# Patient Record
Sex: Female | Born: 1960
Health system: Southern US, Community
[De-identification: ages and names within clinical notes are randomized; demographics above are authoritative.]

## PROBLEM LIST (undated history)

## (undated) DIAGNOSIS — D649 Anemia, unspecified: Secondary | ICD-10-CM

## (undated) DIAGNOSIS — M545 Low back pain, unspecified: Secondary | ICD-10-CM

## (undated) DIAGNOSIS — M797 Fibromyalgia: Secondary | ICD-10-CM

## (undated) DIAGNOSIS — M1990 Unspecified osteoarthritis, unspecified site: Secondary | ICD-10-CM

## (undated) DIAGNOSIS — K852 Alcohol induced acute pancreatitis without necrosis or infection: Secondary | ICD-10-CM

## (undated) DIAGNOSIS — F329 Major depressive disorder, single episode, unspecified: Secondary | ICD-10-CM

## (undated) DIAGNOSIS — F32A Depression, unspecified: Secondary | ICD-10-CM

## (undated) DIAGNOSIS — G8929 Other chronic pain: Secondary | ICD-10-CM

## (undated) DIAGNOSIS — F419 Anxiety disorder, unspecified: Secondary | ICD-10-CM

## (undated) DIAGNOSIS — G43909 Migraine, unspecified, not intractable, without status migrainosus: Secondary | ICD-10-CM

## (undated) DIAGNOSIS — M1711 Unilateral primary osteoarthritis, right knee: Secondary | ICD-10-CM

## (undated) DIAGNOSIS — Z9289 Personal history of other medical treatment: Secondary | ICD-10-CM

## (undated) DIAGNOSIS — S83271A Complex tear of lateral meniscus, current injury, right knee, initial encounter: Secondary | ICD-10-CM

## (undated) HISTORY — PX: ENDOMETRIAL ABLATION: SHX621

## (undated) HISTORY — PX: POSTERIOR FUSION CERVICAL SPINE: SUR628

## (undated) HISTORY — PX: KNEE SURGERY: SHX244

## (undated) HISTORY — DX: Unspecified osteoarthritis, unspecified site: M19.90

## (undated) HISTORY — PX: CARPAL TUNNEL RELEASE: SHX101

## (undated) HISTORY — PX: OSTEOTOMY AND ULNAR SHORTENING: SHX2140

## (undated) HISTORY — DX: Alcohol induced acute pancreatitis without necrosis or infection: K85.20

## (undated) HISTORY — PX: BACK SURGERY: SHX140

## (undated) HISTORY — PX: CYSTECTOMY: SUR359

## (undated) HISTORY — PX: TONSILLECTOMY: SUR1361

## (undated) HISTORY — PX: JOINT REPLACEMENT: SHX530

## (undated) HISTORY — PX: LAPAROSCOPIC CHOLECYSTECTOMY: SUR755

## (undated) HISTORY — PX: ANTERIOR CERVICAL DECOMP/DISCECTOMY FUSION: SHX1161

## (undated) HISTORY — DX: Migraine, unspecified, not intractable, without status migrainosus: G43.909

---

## 2000-09-28 ENCOUNTER — Other Ambulatory Visit: Admission: RE | Admit: 2000-09-28 | Discharge: 2000-09-28 | Payer: Self-pay | Admitting: *Deleted

## 2002-01-01 ENCOUNTER — Other Ambulatory Visit: Admission: RE | Admit: 2002-01-01 | Discharge: 2002-01-01 | Payer: Self-pay | Admitting: Obstetrics and Gynecology

## 2002-03-13 ENCOUNTER — Ambulatory Visit (HOSPITAL_BASED_OUTPATIENT_CLINIC_OR_DEPARTMENT_OTHER): Admission: RE | Admit: 2002-03-13 | Discharge: 2002-03-14 | Payer: Self-pay | Admitting: Orthopedic Surgery

## 2004-02-14 ENCOUNTER — Emergency Department (HOSPITAL_COMMUNITY): Admission: EM | Admit: 2004-02-14 | Discharge: 2004-02-14 | Payer: Self-pay | Admitting: *Deleted

## 2004-02-17 ENCOUNTER — Other Ambulatory Visit: Admission: RE | Admit: 2004-02-17 | Discharge: 2004-02-17 | Payer: Self-pay | Admitting: Obstetrics and Gynecology

## 2004-09-15 ENCOUNTER — Other Ambulatory Visit: Admission: RE | Admit: 2004-09-15 | Discharge: 2004-09-15 | Payer: Self-pay | Admitting: Obstetrics & Gynecology

## 2005-02-15 ENCOUNTER — Emergency Department (HOSPITAL_COMMUNITY): Admission: EM | Admit: 2005-02-15 | Discharge: 2005-02-15 | Payer: Self-pay | Admitting: Emergency Medicine

## 2005-02-25 ENCOUNTER — Other Ambulatory Visit: Admission: RE | Admit: 2005-02-25 | Discharge: 2005-02-25 | Payer: Self-pay | Admitting: Obstetrics & Gynecology

## 2005-09-02 ENCOUNTER — Ambulatory Visit (HOSPITAL_COMMUNITY): Admission: RE | Admit: 2005-09-02 | Discharge: 2005-09-03 | Payer: Self-pay | Admitting: Neurosurgery

## 2005-09-22 ENCOUNTER — Encounter: Admission: RE | Admit: 2005-09-22 | Discharge: 2005-09-22 | Payer: Self-pay | Admitting: Neurosurgery

## 2005-11-22 HISTORY — PX: KNEE SURGERY: SHX244

## 2006-03-22 ENCOUNTER — Encounter: Admission: RE | Admit: 2006-03-22 | Discharge: 2006-03-22 | Payer: Self-pay | Admitting: Neurosurgery

## 2006-10-31 ENCOUNTER — Inpatient Hospital Stay (HOSPITAL_COMMUNITY): Admission: RE | Admit: 2006-10-31 | Discharge: 2006-11-02 | Payer: Self-pay | Admitting: Orthopedic Surgery

## 2006-11-22 HISTORY — PX: POSTERIOR FUSION CERVICAL SPINE: SUR628

## 2006-12-26 ENCOUNTER — Ambulatory Visit (HOSPITAL_BASED_OUTPATIENT_CLINIC_OR_DEPARTMENT_OTHER): Admission: RE | Admit: 2006-12-26 | Discharge: 2006-12-26 | Payer: Self-pay | Admitting: Orthopedic Surgery

## 2007-05-25 ENCOUNTER — Ambulatory Visit (HOSPITAL_COMMUNITY): Admission: RE | Admit: 2007-05-25 | Discharge: 2007-05-25 | Payer: Self-pay | Admitting: Neurosurgery

## 2007-06-12 ENCOUNTER — Inpatient Hospital Stay (HOSPITAL_COMMUNITY): Admission: RE | Admit: 2007-06-12 | Discharge: 2007-06-13 | Payer: Self-pay | Admitting: Neurosurgery

## 2008-11-22 HISTORY — PX: CARPAL TUNNEL RELEASE: SHX101

## 2009-02-21 ENCOUNTER — Ambulatory Visit (HOSPITAL_COMMUNITY): Admission: RE | Admit: 2009-02-21 | Discharge: 2009-02-21 | Payer: Self-pay | Admitting: Obstetrics and Gynecology

## 2009-04-25 ENCOUNTER — Encounter: Admission: RE | Admit: 2009-04-25 | Discharge: 2009-04-25 | Payer: Self-pay | Admitting: Orthopedic Surgery

## 2009-05-21 ENCOUNTER — Ambulatory Visit (HOSPITAL_BASED_OUTPATIENT_CLINIC_OR_DEPARTMENT_OTHER): Admission: RE | Admit: 2009-05-21 | Discharge: 2009-05-21 | Payer: Self-pay | Admitting: Orthopedic Surgery

## 2009-07-23 ENCOUNTER — Encounter (INDEPENDENT_AMBULATORY_CARE_PROVIDER_SITE_OTHER): Payer: Self-pay | Admitting: Orthopedic Surgery

## 2009-07-23 ENCOUNTER — Ambulatory Visit (HOSPITAL_BASED_OUTPATIENT_CLINIC_OR_DEPARTMENT_OTHER): Admission: RE | Admit: 2009-07-23 | Discharge: 2009-07-23 | Payer: Self-pay | Admitting: Orthopedic Surgery

## 2009-11-22 HISTORY — PX: LAPAROSCOPIC CHOLECYSTECTOMY: SUR755

## 2011-02-26 LAB — ANAEROBIC CULTURE: Gram Stain: NONE SEEN

## 2011-02-26 LAB — FUNGUS CULTURE W SMEAR
Fungal Smear: NONE SEEN
Fungal Smear: NONE SEEN

## 2011-02-26 LAB — WOUND CULTURE
Culture: NO GROWTH
Culture: NO GROWTH
Gram Stain: NONE SEEN

## 2011-02-26 LAB — POCT HEMOGLOBIN-HEMACUE: Hemoglobin: 13.1 g/dL (ref 12.0–15.0)

## 2011-03-01 LAB — POCT HEMOGLOBIN-HEMACUE: Hemoglobin: 12.6 g/dL (ref 12.0–15.0)

## 2011-03-03 LAB — CBC
HCT: 32.4 % — ABNORMAL LOW (ref 36.0–46.0)
Hemoglobin: 11.4 g/dL — ABNORMAL LOW (ref 12.0–15.0)
MCHC: 35.1 g/dL (ref 30.0–36.0)
MCV: 96.8 fL (ref 78.0–100.0)
Platelets: 214 10*3/uL (ref 150–400)
RBC: 3.34 MIL/uL — ABNORMAL LOW (ref 3.87–5.11)
RDW: 12.1 % (ref 11.5–15.5)
WBC: 6 10*3/uL (ref 4.0–10.5)

## 2011-04-06 NOTE — Op Note (Signed)
[name], [name]               ACCOUNT NO.:  [id number]   MEDICAL RECORD NO.:  [id number]          PATIENT TYPE:  INP   LOCATION:  3033                         FACILITY:  [facility]   PHYSICIAN:  Reinaldo Meeker, M.D. DATE OF BIRTH:  [birth year]   DATE OF PROCEDURE:  06/12/2007  DATE OF DISCHARGE:                               OPERATIVE REPORT   PREOPERATIVE DIAGNOSIS:  Spondylosis and instability, C4-5 left.   POSTOPERATIVE DIAGNOSIS:  Spondylosis and instability, C4-5 left.   PROCEDURE:  C4-5 cervical foraminotomy, followed by C4-5 posterior  cervical fusion, followed by nonsegmental instrumentation with a Vertex  lateral mass plate, screws, E4-5.   SURGEON:  Reinaldo Meeker, M.D.   ASSISTANT:  Kathaleen Maser. Pool, M.D.   PROCEDURE IN DETAIL:  After being placed the prone position in three-  point pin fixation, the patient's neck was prepped and draped in the  usual sterile fashion.  Localizing fluoroscopy was used prior to  incision to identify the appropriate level.  Midline incision was made  over the spinous processes of C3, C4, C5 and C6.  Using Bovie cutting  current, the incision was carried down to the spinous processes.  Subperiosteal dissection was then carried out along the spinous  processes, laminae, facet joints and lateral masses bilaterally and a  self-retaining retractor was placed for exposure.  Fluoroscopy showed  approach to the C4-5 level.  Foraminotomy was then performed on the left  side by removing the inferior edge of the C4 lamina and the superior  edge of the C5 lamina, then undermining the facet joint without  destabilizing it.  The C5 nerve was then tracked out its foramen until  it was well decompressed.  Vascular adhesions upon it were coagulated  and incised longitudinally to completely expose and free up the nerve  root.  A full inspection was carried out at this level for any evidence  of residual compression and none could be identified.   Irrigation was  carried out.  Bleeding was controlled with bipolar coagulation and  Gelfoam.  Lateral mass plates were then placed without difficulty with  14 mm Vertex screws.  A template was used to measure the appropriate-  length rod, which was cut, secured to the screws with a top-loading nut.  Compression was then carried out on the patient's right side but not on  the left, and final tightening was carried out.  Posterior fusion in the  cervical region was then carried out by decorticating with a high-speed  drill and placing a combination of Vitoss and BMP.  A Hemovac drain was  then brought out through a separate stab incision and left in the  epidural space.  The wound was then closed in multiple layers of Vicryl  in the muscle, fascia, subcutaneous and subcuticular tissues.  Staples  were placed on the skin.  Final fluoroscopy showed the instrumentation  to be in excellent position.  Staples were placed on the skin.  A  sterile dressing was then applied.  The patient was extubated, taken to  the recovery room in stable condition.  ______________________________  Reinaldo Meeker, M.D.     ROK/MEDQ  D:  06/12/2007  T:  06/13/2007  Job:  [id number]

## 2011-04-06 NOTE — Op Note (Signed)
[name], [name]               ACCOUNT NO.:  [id number]   MEDICAL RECORD NO.:  [id number]          PATIENT TYPE:  AMB   LOCATION:  SDC                           FACILITY:  [facility]   PHYSICIAN:  Malva Limes, M.D.    DATE OF BIRTH:  1961/08/08   DATE OF PROCEDURE:  DATE OF DISCHARGE:                               OPERATIVE REPORT   PREOPERATIVE DIAGNOSIS:  Menorrhagia.   POSTOPERATIVE DIAGNOSIS:  Menorrhagia.   PROCEDURE:  1. Dilation and curettage.  2. Endometrial ablation with NovaSure device.   SURGEON:  Malva Limes, M.D.   ANESTHESIA:  MAC with paracervical block.   DRAINS:  Red rubber catheter bladder.   COMPLICATIONS:  None.   SPECIMENS:  None.   ESTIMATED BLOOD LOSS:  Minimal.   PROCEDURE:  The patient was taken to the operating room.  She was placed  in dorsal supine position.  A MAC anesthetic was administered without  difficulty.  She was then placed in dorsal lithotomy position and  prepped and draped in the usual fashion for this procedure.  Her bladder  was drained with a red rubber catheter.  Exam under anesthesia revealed  a retroverted uterus.  The cervix was then injected with 18 cc of 1%  lidocaine.  A single-tooth tenaculum was applied to the anterior  cervical lip.  The cervix was fairly dilated to a 29-French.  The  uterine cavity length was measured at 5 cm with.  Sharp curettage was  performed with minimal tissue being obtained.  The NovaSure device was  placed into the uterine cavity and opened to a width of 3.3 cm.  A seal  test was performed and passed.  The device was then turned on for a  minute and 10 seconds and device was removed.  The patient tolerated the  procedure well.  She was awakened and taken to the recovery room in  stable condition.  Instrument and lap counts were correct x1.  The  patient will be discharged home.  She will be sent home with Vicodin to  take p.r.n.  She will follow up in the office in 4 weeks.     ______________________________  Malva Limes, M.D.     MA/MEDQ  D:  02/21/2009  T:  02/21/2009  Job:  [id number]

## 2011-04-06 NOTE — Op Note (Signed)
NAMEKANYLAH, MUENCH               ACCOUNT NO.:  [id number]   MEDICAL RECORD NO.:  [id number]          PATIENT TYPE:  AMB   LOCATION:  DSC                          FACILITY:  [facility]   PHYSICIAN:  Artist Pais. Weingold, M.D.DATE OF BIRTH:  03/02/1961   DATE OF PROCEDURE:  07/23/2009  DATE OF DISCHARGE:                               OPERATIVE REPORT   PREOPERATIVE DIAGNOSIS:  Failed hardware left ulnar shortening  osteotomy.   POSTOPERATIVE DIAGNOSIS:  Failed hardware left ulnar shortening  osteotomy.   PROCEDURE:  Redo fixation using a five-hole DCP plate and three 3.5 mm  cortical screws and one 16-mm cortical screw.   SURGEON:  Artist Pais. Mina Marble, MD   ASSISTANT:  Betha Loa, MD   ANESTHESIA:  Axillary block and general.   TOURNIQUET TIME:  43 minutes.   COMPLICATIONS:  None.   DRAINS:  None.   OPERATIVE REPORT:  The patient was taken to the operative suite.  After  induction of adequate axillary block analgesia and general anesthetic,  left upper extremity was prepped and draped in sterile fashion.  An  Esmarch was used to exsanguinate the limb and tourniquet was inflated to  250 mmHg.  At this point in time, using a previous incision, the skin  was incised to 6-8 cm.  The interval between the ECU and FCU was  developed down to the osteotomy site.  There was complete failure of the  distal locking screws on the 2.4 mm Synthes modular handset plate.  The  plate and screws were removed without difficulty.  The fracture site was  debrided.  Cultures were sent both of the fluid and soft tissue  surrounding the osteotomy site.  Once this was done, the osteotomy site  was freshened, all debris was removed and plate fixation was then  achieved using a five-hole DCP plate with the distal fragment being  fixated first followed by fixating to the proximal fragment using the  appropriate reduction clamps.  After this was done, intraoperative  fluoroscopy revealed adequate  reduction in AP and lateral view including  about 1 mm of ulnar minus variance.  The wound was then irrigated and  loosely closed in layers of 2-0 Vicryl and 3-0 Prolene subcuticular  stitch on the skin.  Steri-Strips, 4x4s fluffs and a sugar-tong splint  was applied.  The patient tolerated the procedure well and went to  recovery room in stable fashion.     Artist Pais Mina Marble, M.D.  Electronically Signed    MAW/MEDQ  D:  07/23/2009  T:  07/24/2009  Job:  [id number]

## 2011-04-09 NOTE — Op Note (Signed)
[name], [name]               ACCOUNT NO.:  [id number]   MEDICAL RECORD NO.:  [id number]          PATIENT TYPE:  AMB   LOCATION:  SDS                          FACILITY:  [facility]   PHYSICIAN:  Reinaldo Meeker, M.D. DATE OF BIRTH:  [birth year]   DATE OF PROCEDURE:  09/02/2005  DATE OF DISCHARGE:                                 OPERATIVE REPORT   PREOPERATIVE DIAGNOSIS:  Herniated disk, C5-6, C6-7 left.   POSTOPERATIVE DIAGNOSIS:  Herniated disk, C5-6, C6-7 left.   OPERATION PERFORMED:  C5-6, C6-7 anterior cervical diskectomy with bone bank  fusion followed by AccuFix anterior cervical plating.   SURGEON:  Reinaldo Meeker, M.D.   ASSISTANT:  Tia Alert, MD   ANESTHESIA:  General.   DESCRIPTION OF PROCEDURE:  After being placed in the supine position and  five pounds halter traction, the patient's neck was prepped and draped in  the usual sterile fashion.  Localizing x-ray was taken prior to incision to  identify the appropriate level.  A transverse incision was made in the right  anterior neck, starting at the midline heading towards the medial aspect of  the sternocleidomastoid muscle.  The platysma muscle was then incised  transversely.  The natural fascia plane between the strap muscles medially  and the sternocleidomastoid muscle laterally was identified, followed down  to the anterior aspect of the cervical spine.  The longus colli muscles were  identified and split in the midline, stripped away bilaterally with the  Optician, dispensing.  Self-retaining retractor was placed for  exposure and x-ray showed approach to the appropriate levels.  Using a 15  blade, the annulus of the disk at C5-6 and C6-7 was incised.  Using  pituitary rongeurs and curettes, approximately 90% of the disk material was  removed.  High speed drill was used to widen the interspace.  The microscope  was draped and brought into the field and used for the remainder of the  case.   Starting at C5-6 using microdissection technique the remainder of  disk material down to posterior longitudinal ligament was removed.  The  ligament was incised transversely and the cut edges were removed with  Kerrison punch.  Inspection was carried out the foramina bilaterally until  the C6 nerve roots bilaterally were decompressed.  At this time attention  was turned to the C6-7.  Once again the remainder of the disk material down  the posterior longitudinal ligament was removed.  Once again the ligament  was incised and cut edges removed.  Once again thorough spinal decompression  was carried out as well as decompression of the proximal nerve roots in  their foramina.  At this time inspection was carried out in all directions  for any evidence of residual compression and none could be identified.  Large amounts of irrigation were carried out at this time.  Any bleeding was  controlled with bipolar coagulation and Gelfoam.  Measurements were taken  and 6 mm bone bank plugs were reconstituted and impacted at C5-6 and a 7 mm  plug impacted at C6-7.  Fluoroscopy showed  them to be in good position.  An  appropriate length Accufix anterior cervical plate was then chosen.  Under  fluoroscopic guidance, drill holes were placed followed by placement of 13  mm screws times six.  These were done under fluoroscopic guidance.  When  they were all in, the plates, screws and plugs were all found to be in good  position.  At this time large amounts of irrigation were carried out.  Any  bleeding was controlled with  bipolar coagulation.  The wound was then closed with interrupted Vicryl on  the platysma muscle, inverted 5-0 PDS in the subcuticular layer and Steri-  Strips on the skin.  Sterile dressing and soft collar were then applied.  The patient was extubated and taken to recovery room in stable condition.           ______________________________  Reinaldo Meeker, M.D.     ROK/MEDQ  D:   09/02/2005  T:  09/03/2005  Job:  [id number]

## 2011-04-09 NOTE — Op Note (Signed)
[name], [name]               ACCOUNT NO.:  [id number]   MEDICAL RECORD NO.:  [id number]          PATIENT TYPE:  AMB   LOCATION:  DSC                          FACILITY:  [facility]   PHYSICIAN:  Robert A. Thurston Hole, M.D. DATE OF BIRTH:  1961-03-02   DATE OF PROCEDURE:  12/26/2006  DATE OF DISCHARGE:                               OPERATIVE REPORT   PREOPERATIVE DIAGNOSIS:  Left total knee arthrofibrosis.   POSTOPERATIVE DIAGNOSIS:  Left total knee arthrofibrosis.   PROCEDURE:  Left total knee examination under anesthesia followed by  manipulation and injection.   SURGEON:  Elana Alm. Thurston Hole, M.D.   ANESTHESIA:  General.   OPERATIVE TIME:  10 minutes.   COMPLICATIONS:  None.   INDICATIONS FOR PROCEDURE:  Ms. [name] is a 45-year woman who is two  months post left total knee replacement who has had significant  difficulty regaining flexion.  She has been stuck at 90 degrees of  flexion for the past month and is now to undergo manipulation and  injection.   DESCRIPTION:  Ms. [name] was brought to operating room on 12/26/2006,  placed operative table in supine position.  She received Ancef 1 gram IV  preoperatively for prophylaxis.  After being placed under general  anesthesia, her left knee was examined.  Range of motion from zero to 90  degrees.  A gentle manipulation and flexion was carried out breaking up  adhesions and improving flexion to 125 degrees.  Extension remained at  0, knee remained stable with normal patellar tracking.  The knee was  then sterilely injected with 10 mL of 0.25% Marcaine with epinephrine  and 80 mg of Depo-Medrol.  She tolerated this well.   FOLLOW-UP CARE:  She will be treated as outpatient on Percocet for pain  with early aggressive physical therapy and a CPM machine.  See her back  in the office in a week for recheck and follow-up.      Robert A. Thurston Hole, M.D.  Electronically Signed     RAW/MEDQ  D:  12/26/2006  T:  12/27/2006  Job:   [id number]

## 2011-04-09 NOTE — Discharge Summary (Signed)
NAMEANJU, SERENO               ACCOUNT NO.:  [id number]   MEDICAL RECORD NO.:  [id number]          PATIENT TYPE:  INP   LOCATION:  5007                         FACILITY:  [facility]   PHYSICIAN:  Elana Alm. Thurston Hole, M.D. DATE OF BIRTH:  05-30-1961   DATE OF ADMISSION:  10/31/2006  DATE OF DISCHARGE:  11/02/2006                               DISCHARGE SUMMARY   ADMITTING DIAGNOSIS:  End-stage degenerative joint disease left knee.   DISCHARGE DIAGNOSIS:  1. End-stage degenerative joint disease left knee.  2. Acute blood loss anemia.  3. Hypokalemia   HISTORY OF PRESENT ILLNESS:  The patient is a 50 year old white female  with a history of significant trauma to her knee and she has undergone  five left knee surgeries including two large open procedures including  an ACL.  She now has post traumatic DJD.  She has failed conservative  care including debriding arthroscopy and anti-inflammatories and formal  physical therapy.  She understands her risks, benefits and possible  complications of the left total knee replacement and is without  questions.   PROCEDURES IN HOUSE:  On October 31, 2006 the patient underwent a left  total knee replacement by Dr. Thurston Hole and a left femoral nerve block by  anesthesia.  She tolerated both procedures well.   HOSPITAL COURSE:  She was admitted postoperatively for pain control, DVT  prophylaxis and physical therapy.  She was placed on the CPM 0 to 90  degrees postoperatively.  Postop day #1 hemoglobin was 7.9.  She was  typed and crossed and transfused 2 units of packed red blood cells with  20 mg Lasix IV between units.  She was given milk of magnesia to prevent  constipation.  Her PCA was discontinued.  Her Foley was discontinued and  her dressing was changed.  Postop day #2 pain was under control with  p.o. pain medicine, two Oxy-IR every 3 hours.  Her hemoglobin post  transfusion was 9.7.  She was hypokalemic with a potassium of 3.2 after  IV  Lasix.  She was not tolerating the CPM as well since her femoral  nerve block had worn off and she was only 0 to 40 on her CPM.  She was  given 40 mEq of K-Dur twice before discharge.  She was given home CPM  unit, home walker with wheels, home three in one commode.  She was  discharged on Oxy-IR 5 mg one to two q. 3 hours p.r.n. pain, Robaxin 500  mg one p.o. q. 4 to 6 hours p.r.n. muscle spasm and Coumadin 5 mg daily  and slow release iron 1 tablet twice a day.  She has been instructed to  take Colace 100 mg twice a day and if no BM in 2 days then Senokot S two  tablets with  dinner.  She has been instructed to use her CPM 0 to 60 degrees 8 hours  a day, increasing by 5 degrees a day, elevate her left heel on a folded  pillow for 30 minutes every morning.  She will follow up with Dr. Thurston Hole  on November 10, 2006 for stitch removal and x-ray.      Kirstin Shepperson, P.A.      Robert A. Thurston Hole, M.D.  Electronically Signed    KS/MEDQ  D:  12/22/2006  T:  12/22/2006  Job:  [id number]

## 2011-04-09 NOTE — Op Note (Signed)
[facility]  Patient:    [name], [name] Visit Number: [id number] MRN: [id number]          Service Type: DSU Location: [facility] Attending Physician:  Twana First Dictated by:   Elana Alm Thurston Hole, M.D. Proc. Date: 03/13/02 Admit Date:  03/13/2002                             Operative Report  PREOPERATIVE DIAGNOSES: 1. Right knee anterior cruciate ligament tear. 2. Right knee lateral meniscus tear.  POSTOPERATIVE DIAGNOSES: 1. Right knee anterior cruciate ligament tear. 2. Right knee lateral meniscus tear.  PROCEDURE: 1. Right knee EOA, followed by arthroscopically assisted endoscopic bone    patellar tendon bone allograft ACL reconstruction using 9 x 24 mm femoral    interference BioScrew and 9 x 25 mm tibial interference BioScrew. 2. Right knee partial meniscectomy.  SURGEON:  Elana Alm. Thurston Hole, M.D.  ASSISTANT:  Julien Girt, P.A.  ANESTHESIA:  General.  OPERATIVE TIME:  1 hour and 20 minutes.  COMPLICATIONS:  None.  INDICATIONS FOR PROCEDURE:  The patient is a 50 year old woman who sustained injury to her right knee 1 month ago with a twisting type injury.  Significant pain, swelling, and instability with examination with an MRI documenting a complete ACL tear and probable meniscal tear who has failed conservative care and is now to undergo arthroscopy and ACL reconstruction.  DESCRIPTION OF PROCEDURE:  The patient is brought to the operating room on March 13, 2002, and placed on the operating room table in supine position. After an adequate level of general anesthesia was obtained her right knee was examined under anesthesia.  She had full range of motion, 3+ Lachman, positive pivot shift, and knee stable to varus, valgus and posterior stress with normal patella tracking.  The right knee was sterilely injected with 0.25% Marcaine with epinephrine. She received Ancef 1 gram IV preoperatively for prophylaxis.  The right  leg was then prepped using sterile Betadine and draped using sterile technique. Originally through an inferolateral portal the arthroscope with a pump attached was placed and through an inferomedial portal and arthroscopic probe was placed.  On initial inspection of the medial compartment, the articular cartilage, medial femoral condyle, medial tibial plateau, was found to be normal.  The medial meniscus was probed and this was found to be normal.  The intercondylar notch was inspected and the anterior cruciate ligament was completely torn in its mid substance with significant anterior laxity and this was thoroughly debrided and an notch plasty was performed.  The posterior cruciate was intact and stable.  The lateral compartment, articular cartilage, showed mild grade 1 to 2 chondromalacia.  The lateral meniscus showed tearing at the posterolateral corner.  A radial flap tear of which 20% was resected back to a stable rim.  Popliteus tendon was intact.  Patellofemoral joint showed grade 3 chondromalacia with 30% of the patella which was debrided.  The femoral groove, articular cartilage was intact and the patella tracked normally.  Moderate synovitis in the medial and lateral gutters was debrided otherwise this was free of pathology.  At this point then a separate anterior medial portal was made for placement of the tibial drill guide.  A Steinmann pin was then used to drill the ACL insertion point on the tibial plateau through a 1-1/2 cm anterior medial proximal tibial incision and overdrilled with a 10 mm drill.  Through this tibial tunnel the posterior  femoral guide was placed into the posterior femoral notch and a Steinmann pin drilled up in the ACL origin point of the posterior femoral notch and then overdrilled to a depth of 30 mm leaving a posterior 2 mm bone bridge.  After this was done a double pin passer was brought up through the tibial tunnel and joint and through the  femoral tunnel and thigh through a stab wound.  This was used to pass the ACL graft which had been prepared on the back table with a 10 x 25 mm of patella bone and tibial tubercle bone and a 12 mm tubed allograft.  After the ACL graft was passed up through the tibial hole and joined into the femoral tunnel it was locked into position there with a 9 x 25 mm femoral interference BioScrew.  The knee was then taken through a full range of motion and there was found to be no impingement on the graft.  The tibial bone plug was then locked into its tunnel with a 9 x 25 mm interference BioScrew screw with the knee in 30 degrees of flexion and the tibial held reduced on the femur.  After this was done the knee was tested for stability. The Lachman and pivot shift were found to be totally eliminated and the knee could be brought through a full range of motion with no impingement of the graft.  At this point it was felt that all pathology had been satisfactorily addressed.  The wounds were irrigated and closed using 2-0 Vicryl and 3-0 Prolene.  Steri-Strips were applied.  Sterile dressings and a long leg splint applied.  The patient then had a femoral nerve block placed by anesthesia for postoperative pain control.  She was then awakened and taken to the recovery room in stable condition.  FOLLOWUP:  The patient will be followed as a overnight recovery care patient for IV pain control and neurovascularly monitoring, CPM and ice machine use. Discharged tomorrow on Percocet and Naprosyn with an home CPM and ice machine. See me back in the office in 1 week for sutures out and follow up. Dictated by:   Elana Alm Thurston Hole, M.D. Attending Physician:  Twana First DD:  03/13/02 TD:  03/13/02 Job: [id number]

## 2011-04-09 NOTE — Op Note (Signed)
[name], [name]               ACCOUNT NO.:  [id number]   MEDICAL RECORD NO.:  [id number]          PATIENT TYPE:  AMB   LOCATION:  DSC                          FACILITY:  [facility]   PHYSICIAN:  Artist Pais. Weingold, M.D.DATE OF BIRTH:  1961-06-05   DATE OF PROCEDURE:  05/22/2009  DATE OF DISCHARGE:                               OPERATIVE REPORT   PREOPERATIVE DIAGNOSIS:  Left wrist ulnar carpal abutment syndrome.   POSTOPERATIVE DIAGNOSIS:  Left wrist ulnar carpal abutment syndrome.   PROCEDURE:  Left wrist arthroscopy, debridement of triangular  fibrocartilage complex and lunotriquetral tears as well as open ulnar  shortening osteotomy with plate fixation.   SURGEON:  Artist Pais. Mina Marble, MD   ASSISTANT:  None.   ANESTHESIA:  Axillary block and general.   TOURNIQUET TIME:  One hour and 20 minutes.   COMPLICATIONS:  None.   DRAIN:  None.   OPERATIVE REPORT:  The patient was taken to the operating suite.  After  induction of adequate general anesthesia, left upper extremity was  prepped and draped in sterile fashion.  Esmarch was used to exsanguinate  the limb.  Tourniquet was then inflated with 250 mm.  At this point in  time, an incision was made over the subcutaneous border of the ulna in  the left distal forearm.  Incision was started 3 cm proximal to the  ulnar styloid tip, extended proximally in longitudinal fashion.  Skin  was incised.  The interval between the FCU and ECU was split.  A  subperiosteal dissection was undertaken of the ulna.  The ulna was  exposed.  On the dorsal surface, a 2.4-mm modular handset plate was then  placed, two locking screws were placed distally.  Once this was done,  the bone was marked with a marking pen to help maintain proper  alignment.  Plate was removed, an oblique osteotomy was performed using  a sagittal saw.  Once this was done, the shortening was undertaken of  about 3 mm.  The bone was shortened and then fixed with the  remaining  cortical screws and a commissure locked and unlocked screws proximally  and distally.  Small bone graft was packed around the osteotomy site.  Intraoperative fluoroscopy revealed ulnar neutral to slightly minus  variance and good alignment in AP and lateral plain, and wound was  irrigated and loosely closed in layers of 2-0 Vicryl and a 3-0 Prolene  subcuticular stitch on the skin.  The patient's upper extremity was then  placed in traction tower with 15 pounds traction across the radiocarpal  joint.  A standard 3/4 arthroscopic portal was established.  Once this  was done.  Scope was introduced into the joint.  A 4/5 working portal  was established as well as a 6/2 outflow portal.  A complex TFCC tear  was debrided using 6.9 suction shaver and a 2.3 mm ArthroCare wand.  Once this was done, a small LT tear was debrided as well.  Visualization  of the radial side of the joint revealed intact radial side ligaments  and slight fraying of the SL ligament was  also debrided  using the ArthroCare wand.  At the end of procedure, the instrument was  remove, the portals were closed with 3-0 Prolene.  Steri-Strips, 4 x 4s  fluffs, and a volar splint was applied.  The patient tolerated the  procedures well.  Wound was scrubbed in stable fashion.      Artist Pais Mina Marble, M.D.  Electronically Signed     MAW/MEDQ  D:  05/22/2009  T:  05/22/2009  Job:  [id number]

## 2011-04-09 NOTE — Op Note (Signed)
NAMEJANDY, [name]               ACCOUNT NO.:  [id number]   MEDICAL RECORD NO.:  [id number]          PATIENT TYPE:  INP   LOCATION:  5007                         FACILITY:  [facility]   PHYSICIAN:  Elana Alm. Thurston Hole, M.D. DATE OF BIRTH:  May 25, 1961   DATE OF PROCEDURE:  10/31/2006  DATE OF DISCHARGE:                               OPERATIVE REPORT   PREOPERATIVE DIAGNOSIS:  Left knee posttraumatic degenerative joint  disease.   POSTOPERATIVE DIAGNOSIS:  Left knee posttraumatic degenerative joint  disease.   PROCEDURE:  Left total knee replacement using DePuy cemented total knee  system with #4 cement femur, #3 cement tibia with 10-mm polyethylene RP  tibial spacer, and 35 mm polyethylene cemented patella.   SURGEON:  Salvatore Marvel, MD   ASSISTANT:  Julien Girt, PA-C   ANESTHESIA:  General.   OPERATIVE TIME:  One hour and 40 minutes.   COMPLICATIONS:  None.   DESCRIPTION OF PROCEDURE:  [name] was brought to the operating room  on October 31, 2006 and placed on the operative table in supine  position after a femoral nerve block was placed in the holding area by  anesthesia.  She was placed on the operative table in supine position.  She received Ancef 1 gm IV preoperatively for prophylaxis.  After being  placed under general anesthesia, her left knee was examined.  She had  range of motion from -3 to 125 degrees, knee with stable ligamentous  exam with normal patella tracking.  She had a Foley catheter placed  under sterile conditions.  I evaluated her knee and her old incisions  preoperatively and also had Dr. Turner Daniels and Dr. Eulah Pont evaluate the  previous medial and lateral incisions, and we all agreed that a central  incision, staying halfway between the old medial and old lateral  incisions, would be the best incision to make and not compromise  exposure and not compromise blood flow to these previous flaps.  I  discussed this in detail with [name] prior to  surgery as well and  she understood.  After the leg was prepped and draped using sterile  technique, the leg was exsanguinated and a tourniquet elevated to 365  mmHg.  Initially, through a 20 cm longitudinal incision in the center of  her knee, initial exposure was made.  The subcutaneous tissues were  incised along with the skin incision.  A median arthrotomy was performed  being very careful not to develop or undercut her flaps on the medial or  the lateral side.  The arthrotomy was performed revealing an excessive  amount of normal-appearing joint fluid.  The articular surfaces were  inspected.  She had grade 4 changes medially and grade 3 and 4 changes  laterally and in the patellofemoral joint.  Osteophytes were removed off  the femoral condyles and tibial plateau.  The medial and lateral  meniscal remnants were removed as well as the anterior cruciate  ligament.  Intramedullary drill was then drilled up the femoral canal  for placement of the distal femoral cutting jig, which was placed in the  appropriate amount of  rotation, and a distal 11 mm cut was made.  The  distal femur was then sized.  A #4 was found to be the appropriate size.  The #4 cutting jig was placed and then these cuts were made.  The  proximal tibia was then exposed.  Tibial spines were removed with an  oscillating saw.  The intramedullary drill was drilled down to the  tibial canal for placement of the proximal tibial cutting jig, which was  placed in the appropriate amount of rotation, and a 6 mm cut was made  based off the medial or lower side.  After this was done, spacer blocks  were placed, both 10-mm blocks placed in flexion and extension.  This  gave excellent balancing and excellent correction of her flexion and  varus deformity.  At this point, the proximal tibia was then exposed.  The #3 tibial base plate was placed and a Keo cut was made.  The PCL box  cutter was then placed on the distal femur and these  cuts were made.  At  this point, the #4 femoral trial was placed, with the #3 tibial base  plate trial, and a 10 mm polyethylene RP tibial spacer.  The knee was  reduced, taken through a range of motion from 0-125 degrees with  excellent stability and excellent correction of her flexion and varus  deformities.  The patella was then sized,  to resurfacing 10 mm cut made  and three locking holes placed for a 35-mm patella.  The patella trial  was placed.  Patellofemoral tracking was evaluated and this was found to  be normal.  At this point, it was felt that all the trial components  were of excellent size, fit, and stability.  They were then removed, and  the knee was then gently lavaged and irrigated with 3 liters of saline.  The proximal tibia was then again exposed and a #3 tibial base plate  with cement backing was hammered into its position with an excellent fit  with excess cement being removed from around the edges.  The #4 femoral  component with cement backing was hammered into position, also with an  excellent fit, with excess cement being removed from around the edges.  The 10 mm polyethylene RP tibial spacer was placed along the tibial base  plate.  The knee reduced and taken through a range of motion from 0-125  degrees with excellent stability and excellent correction of her flexion  and varus deformities.  The 35-mm polyethylene cement back patella was  then placed in its position and held there with a clamp.  After the  cement hardened, patellofemoral tracking was again evaluated and again  found to be normal.  At this point, it was felt that all the components  were of excellent size, fit, and stability.  The knee was further  irrigated with saline and then the tourniquet was released.  Hemostasis  was obtained with cautery.  The arthrotomy was then closed with #1 Ethibond suture over two medium Hemovac drains.  Subcutaneous tissue was  closed with 0 and 2-0 Vicryl.  The  subcuticular layer was closed with 4-  0 Monocryl.  Steri-Strips and sterile dressings were applied.  The  Hemovac injected with 0.25% Marcaine with epinephrine and 4 mg of  morphine and clamped.  The patient was then awakened, extubated, and  taken to the recovery room in stable condition.  Needles and sponge  counts were correct x2 at the end of  the case.      Robert A. Thurston Hole, M.D.  Electronically Signed     RAW/MEDQ  D:  10/31/2006  T:  11/01/2006  Job:  [id number]

## 2011-05-11 ENCOUNTER — Other Ambulatory Visit: Payer: Self-pay | Admitting: Obstetrics & Gynecology

## 2011-08-23 DIAGNOSIS — K852 Alcohol induced acute pancreatitis without necrosis or infection: Secondary | ICD-10-CM

## 2011-08-23 HISTORY — DX: Alcohol induced acute pancreatitis without necrosis or infection: K85.20

## 2011-09-06 LAB — CBC
HCT: 35.1 — ABNORMAL LOW
Hemoglobin: 12.1
MCHC: 34.6
MCV: 95.1
Platelets: 176
RBC: 3.69 — ABNORMAL LOW
RDW: 12.9
WBC: 7.6

## 2012-05-04 ENCOUNTER — Ambulatory Visit: Payer: Self-pay | Admitting: Physical Therapy

## 2012-05-11 ENCOUNTER — Ambulatory Visit: Payer: Medicare Other | Attending: Pain Medicine | Admitting: Physical Therapy

## 2012-11-22 HISTORY — PX: BACK SURGERY: SHX140

## 2014-01-21 ENCOUNTER — Other Ambulatory Visit (HOSPITAL_COMMUNITY): Payer: Medicare Other

## 2014-01-29 ENCOUNTER — Inpatient Hospital Stay: Admit: 2014-01-29 | Payer: Self-pay | Admitting: Orthopedic Surgery

## 2014-01-29 SURGERY — ARTHROPLASTY, KNEE, TOTAL
Anesthesia: General | Laterality: Right

## 2014-03-15 ENCOUNTER — Other Ambulatory Visit: Payer: Self-pay | Admitting: Physician Assistant

## 2014-03-15 DIAGNOSIS — Z1231 Encounter for screening mammogram for malignant neoplasm of breast: Secondary | ICD-10-CM

## 2014-04-12 ENCOUNTER — Ambulatory Visit (HOSPITAL_BASED_OUTPATIENT_CLINIC_OR_DEPARTMENT_OTHER)
Admission: RE | Admit: 2014-04-12 | Discharge: 2014-04-12 | Disposition: A | Discharge status: Home / Self Care | Payer: Medicare HMO | Source: Ambulatory Visit | Attending: Family Medicine | Admitting: Family Medicine

## 2014-04-12 ENCOUNTER — Ambulatory Visit: Payer: Medicare Other

## 2014-04-12 ENCOUNTER — Other Ambulatory Visit (HOSPITAL_BASED_OUTPATIENT_CLINIC_OR_DEPARTMENT_OTHER): Payer: Self-pay | Admitting: Family Medicine

## 2014-04-12 DIAGNOSIS — Q674 Other congenital deformities of skull, face and jaw: Secondary | ICD-10-CM | POA: Insufficient documentation

## 2014-04-12 DIAGNOSIS — R51 Headache: Secondary | ICD-10-CM

## 2014-04-22 ENCOUNTER — Ambulatory Visit (HOSPITAL_BASED_OUTPATIENT_CLINIC_OR_DEPARTMENT_OTHER): Payer: Medicare HMO

## 2014-04-22 ENCOUNTER — Ambulatory Visit: Payer: Medicare Other

## 2014-05-01 ENCOUNTER — Ambulatory Visit (HOSPITAL_BASED_OUTPATIENT_CLINIC_OR_DEPARTMENT_OTHER)
Admission: RE | Admit: 2014-05-01 | Discharge: 2014-05-01 | Disposition: A | Discharge status: Home / Self Care | Payer: Medicare HMO | Source: Ambulatory Visit | Attending: Physician Assistant | Admitting: Physician Assistant

## 2014-05-01 DIAGNOSIS — Z1231 Encounter for screening mammogram for malignant neoplasm of breast: Secondary | ICD-10-CM | POA: Insufficient documentation

## 2014-06-05 ENCOUNTER — Encounter: Payer: Self-pay | Admitting: Neurology

## 2014-06-07 ENCOUNTER — Encounter: Payer: Self-pay | Admitting: Neurology

## 2014-06-07 ENCOUNTER — Ambulatory Visit (INDEPENDENT_AMBULATORY_CARE_PROVIDER_SITE_OTHER): Payer: Commercial Managed Care - HMO | Admitting: Neurology

## 2014-06-07 ENCOUNTER — Encounter (INDEPENDENT_AMBULATORY_CARE_PROVIDER_SITE_OTHER): Payer: Self-pay

## 2014-06-07 VITALS — BP 95/63 | HR 56 | Ht 66.5 in | Wt 151.0 lb

## 2014-06-07 DIAGNOSIS — R519 Headache, unspecified: Secondary | ICD-10-CM | POA: Insufficient documentation

## 2014-06-07 DIAGNOSIS — R51 Headache: Secondary | ICD-10-CM

## 2014-06-07 MED ORDER — AMITRIPTYLINE HCL 10 MG PO TABS: 15.0000 mg | ORAL_TABLET | Freq: Every day | ORAL | Status: DC

## 2014-06-07 NOTE — Progress Notes (Signed)
[facility]    Provider:  Dr Janann Colonel Referring Provider: Abigail Miyamoto, MD Primary Care Physician:  Abigail Miyamoto, MD  CC:  headache  53y/o woman referred from Dr Maceo Pro for headache evaluation  Around 3 months ago developed a near daily headache. Described as bifrontal, squeezing pressure type pain. Constant throughout the day, fluctuates throughout the day. At worst is a 6/10. No nausea or emesis. No photo or phonophobia. No focal motor or sensory changes. Occasional blurry vision with headache but otherwise normal. Recent eye exam was normal. No AMS or confusion, no dysarthria. Has difficulty with sleep, trouble staying asleep. No known snoring or apnea events. No known triggers. If the headache gets severe will take Imitrex and go to bed. Recently started on Elavil 10mg  nightly for headache around 3 weeks ago, no benefit noted. Has not tried any other preventive agent in the past. Sees a pain doctor, takes dilaudid 2mg , typically takes 2-3 times a day, has been on this for around 8 months. Has been on fentanyl patch (33mcg) has been on for a few years.   Around the same time noted multiple areas of indentation in her scalp. Notes she is getting more of these indentations. States that they are sore and ache. Are sensitive to touch.   Has chronic pain related to degenerative cervical disc disease. Has had surgery on multiple levels.   Review of Systems: Out of a complete 14 system review, the patient complains of only the following symptoms, and all other reviewed systems are negative. +  Blurred vision, anemia, easy bruising, easy bleeding, memory loss, confusion, headache  History   Social History  . Marital Status: Single    Spouse Name: Srihitha Tagliaferri     Number of Children: 2  . Years of Education: 12+   Occupational History  . Disabled    Social History Main Topics  . Smoking status: Current Every Day Smoker -- 1.00 packs/day    Types: Cigarettes  .  Smokeless tobacco: Never Used  . Alcohol Use: No  . Drug Use: No  . Sexual Activity: Not on file   Other Topics Concern  . Not on file   Social History Narrative   Patient is single.    Patient has 2 children.    Patient is disabled.    Patient has a college education.     History reviewed. No pertinent family history.  Past Medical History  Diagnosis Date  . Migraine   . Osteoarthritis   . Alcoholic pancreatitis 24/2353    01/15/13    Past Surgical History  Procedure Laterality Date  . Knee surgery      Current Outpatient Prescriptions  Medication Sig Dispense Refill  . amitriptyline (ELAVIL) 10 MG tablet       . baclofen (LIORESAL) 10 MG tablet Take 10 mg by mouth 2 (two) times daily.      Marland Kitchen buPROPion (WELLBUTRIN XL) 150 MG 24 hr tablet Take 150 mg by mouth daily.      . Calcium Carbonate-Vit D-Min (CALCIUM/VITAMIN D/MINERALS) 600-200 MG-UNIT TABS Take by mouth.      . Cyanocobalamin (VITAMIN B 12 PO) Take 500 mcg by mouth.      . fentaNYL (DURAGESIC - DOSED MCG/HR) 50 MCG/HR Place 50 mcg onto the skin every other day.       . gabapentin (NEURONTIN) 300 MG capsule Take 300 mg by mouth 3 (three) times daily. 1 TAB IN THE MORNING, 1 TAB AT LUNCH, 6  TABS AT BEDTIME      . HYDROmorphone (DILAUDID) 2 MG tablet Take 2 mg by mouth every 6 (six) hours as needed for severe pain.      . meclizine (ANTIVERT) 25 MG tablet Take 25 mg by mouth 3 (three) times daily as needed for dizziness.      . meloxicam (MOBIC) 15 MG tablet       . Multiple Vitamins-Minerals (MULTIVITAMIN PO) Take by mouth.      . Omega-3 Fatty Acids (FISH OIL) 1000 MG CAPS Take 1,000 mg by mouth.      . SUMAtriptan (IMITREX) 100 MG tablet        No current facility-administered medications for this visit.    Allergies as of 06/07/2014 - Review Complete 06/07/2014  Allergen Reaction Noted  . Vimovo [naproxen-esomeprazole] Swelling 06/05/2014    Vitals: BP 95/63  Pulse 56  Ht 5' 6.5" (1.689 m)  Wt 151  lb (68.493 kg)  BMI 24.01 kg/m2 Last Weight:  Wt Readings from Last 1 Encounters:  06/07/14 151 lb (68.493 kg)   Last Height:   Ht Readings from Last 1 Encounters:  06/07/14 5' 6.5" (1.689 m)     Physical exam: Exam: Gen: NAD, conversant Eyes: anicteric sclerae, moist conjunctivae HENT: Atraumatic, oropharynx clear Neck: Trachea midline; supple,  Lungs: CTA, no wheezing, rales, rhonic                          CV: RRR, no MRG Abdomen: Soft, non-tender;  Extremities: No peripheral edema  Skin: Normal temperature, no rash,  Psych: Appropriate affect, pleasant  Neuro: MS: AA&Ox3, appropriately interactive, normal affect   Attention: WORLD backwards  Speech: fluent w/o paraphasic error  Memory: good recent and remote recall  CN: PERRL, EOMI no nystagmus, no ptosis, sensation intact to LT V1-V3 bilat, face symmetric, no weakness, hearing grossly intact, palate elevates symmetrically, shoulder shrug 5/5 bilat,  tongue protrudes midline, no fasiculations noted.  Motor: normal bulk and tone Strength: 5/5  In all extremities  Coord: rapid alternating and point-to-point (FNF, HTS) movements intact.  Reflexes: symmetrical, bilat downgoing toes  Sens: LT intact in all extremities  Gait: posture, stance, stride and arm-swing normal. Tandem gait intact. Able to walk on heels and toes. Romberg absent.   Assessment:  After physical and neurologic examination, review of laboratory studies, imaging, neurophysiology testing and pre-existing records, assessment will be reviewed on the problem list.  Plan:  Treatment plan and additional workup will be reviewed under Problem List.  1)Headache 2)Chronic pain 3)Cervical degenerative disc disease  53y/o woman presenting for initial evaluation of chronic daily headache. Based on history the headache appears to be tension type in nature. Suspect there is also a component of medication rebound headache from the daily use of dilaudid.  Counseled patient on the likely diagnosis and etiology. She expressed understanding. Unclear etiology of skull indented areas, may be incidental finding. Due to severe chronic nature of headache will check MRI brain. Will increase Elavil to 15mg  nightly. Can consider switch to topamax in the future if no improvement. Counseled patient to follow up with pain management physician to see if she can try to decrease daily narcotic usage. Follow up in 4 months.   Jim Like, DO  [facility] [address] [place], [place] [address]  Phone [phone] Fax [phone]

## 2014-06-07 NOTE — Patient Instructions (Signed)
Overall you are doing fairly well but I do want to suggest a few things today:   Remember to drink plenty of fluid, eat healthy meals and do not skip any meals. Try to eat protein with a every meal and eat a healthy snack such as fruit or nuts in between meals. Try to keep a regular sleep-wake schedule and try to exercise daily, particularly in the form of walking, 20-30 minutes a day, if you can.   As far as your medications are concerned, I would like to suggest you increase the Elavil to 15mg  nightly (1.5 tablets).   I would like you to have a MRI of the brain. You will be called to schedule this.   I would like to see you back in 4 months, sooner if we need to.   Please also call us for any test results so we can go over those with you on the phone.  My clinical assistant and will answer any of your questions and relay your messages to me and also relay most of my messages to you.   Our phone number is [phone]. We also have an after hours call service for urgent matters and there is a physician on-call for urgent questions. For any emergencies you know to call 911 or go to the nearest emergency room

## 2014-06-16 ENCOUNTER — Ambulatory Visit
Admission: RE | Admit: 2014-06-16 | Discharge: 2014-06-16 | Disposition: A | Discharge status: Home / Self Care | Payer: Medicare HMO | Source: Ambulatory Visit | Attending: Neurology | Admitting: Neurology

## 2014-06-16 DIAGNOSIS — R51 Headache: Secondary | ICD-10-CM

## 2014-10-10 ENCOUNTER — Ambulatory Visit: Payer: Commercial Managed Care - HMO | Admitting: Adult Health

## 2014-12-05 DIAGNOSIS — M25552 Pain in left hip: Secondary | ICD-10-CM | POA: Diagnosis not present

## 2014-12-05 DIAGNOSIS — G8929 Other chronic pain: Secondary | ICD-10-CM | POA: Diagnosis not present

## 2014-12-05 DIAGNOSIS — M25559 Pain in unspecified hip: Secondary | ICD-10-CM | POA: Diagnosis not present

## 2014-12-05 DIAGNOSIS — M792 Neuralgia and neuritis, unspecified: Secondary | ICD-10-CM | POA: Diagnosis not present

## 2014-12-13 DIAGNOSIS — M25552 Pain in left hip: Secondary | ICD-10-CM | POA: Diagnosis not present

## 2014-12-17 NOTE — H&P (Signed)
  [facility] [address] [place], [place] [address] [phone] A Division of [facility]  Ninetta Lights, M.D.   Robert A. Noemi Chapel, M.D.   Faythe Casa, M.D.   Johnny Bridge, M.D.   Almedia Balls, M.D Ernesta Amble. Percell Miller, M.D.  Joseph Pierini, M.D.  Lanier Prude, M.D.    Verner Chol, M.D. Mary L. Venida Jarvis, PA-C  Kirstin A. Shepperson, PA-C  Josh [place], PA-C [place], [place]   RE: [name], [place]   [id number]      DOB: 02/25/1961 PROGRESS NOTE: 12-13-14 Reason for visit:  Follow-up left hi pain. History of present illness: She has known left hip arthritis. She has had multiple injections which previously helped but she had one a few weeks ago which didn't help at all. She continues to have groin pain and pain with ambulation. She has a Trendelenburg gait. Please see associated documentation for this clinic visit for further past medical, family, surgical and social history, review of systems, and exam findings as this was reviewed by me.  EXAMINATION: Well appearing female in no apparent distress. She has pain with hip range of motion, neurovascularly intact skin is benign.  IMAGING: X-rays reviewed by me: 2 views of her left hip show moderate to severe arthritis of the hip with  medial joint space narrowing.  ASSESSMENT/PLAN: Left hip end stage arthritis. I discussed total hip arthroplasty and she would like to go forward. Discussed risks benefits and possible complications and she understands.  Ernesta Amble.  Percell Miller, M.D.  Electronically verified by Ernesta Amble. Percell Miller, M.D. TDM:kah D 12-13-14 T 12-16-14

## 2014-12-26 NOTE — Pre-Procedure Instructions (Addendum)
[name]  12/26/2014   Your procedure is scheduled on:  Tuesday, February 16th  Report to [facility] Admitting at 8 AM.  Call this number if you have problems the morning of surgery: [phone]   Remember:   Do not eat food or drink liquids after midnight.   Take these medicines the morning of surgery with A SIP OF WATER: neurontin, pain medication if needed Stop Meloxicam and Fish oil 12/31/14.  Do not wear jewelry, make-up or nail polish.  Do not wear lotions, powders, or perfumes. You may wear deodorant.  Do not shave 48 hours prior to surgery. Men may shave face and neck.  Do not bring valuables to the hospital.  [facility] is not responsible  for any belongings or valuables.               Contacts, dentures or bridgework may not be worn into surgery.  Leave suitcase in the car. After surgery it may be brought to your room.  For patients admitted to the hospital, discharge time is determined by your    treatment team.    Please read over the following fact sheets that you were given: Pain Booklet, Coughing and Deep Breathing, Blood Transfusion Information, MRSA Information and Surgical Site Infection Prevention  [name] - Preparing for Surgery  Before surgery, you can play an important role.  Because skin is not sterile, your skin needs to be as free of germs as possible.  You can reduce the number of germs on you skin by washing with CHG (chlorahexidine gluconate) soap before surgery.  CHG is an antiseptic cleaner which kills germs and bonds with the skin to continue killing germs even after washing.  Please DO NOT use if you have an allergy to CHG or antibacterial soaps.  If your skin becomes reddened/irritated stop using the CHG and inform your nurse when you arrive at Short Stay.  Do not shave (including legs and underarms) for at least 48 hours prior to the first CHG shower.  You may shave your face.  Please follow these instructions carefully:   1.   Shower with CHG Soap the night before surgery and the morning of Surgery.  2.  If you choose to wash your hair, wash your hair first as usual with your normal shampoo.  3.  After you shampoo, rinse your hair and body thoroughly to remove the shampoo.  4.  Use CHG as you would any other liquid soap.  You can apply CHG directly to the skin and wash gently with scrungie or a clean washcloth.  5.  Apply the CHG Soap to your body ONLY FROM THE NECK DOWN.  Do not use on open wounds or open sores.  Avoid contact with your eyes, ears, mouth and genitals (private parts).  Wash genitals (private parts) with your normal soap.  6.  Wash thoroughly, paying special attention to the area where your surgery will be performed.  7.  Thoroughly rinse your body with warm water from the neck down.  8.  DO NOT shower/wash with your normal soap after using and rinsing off the CHG Soap.  9.  Pat yourself dry with a clean towel.            10.  Wear clean pajamas.            11.  Place clean sheets on your bed the night of your first shower and do not sleep with pets.  Day  of Surgery  Do not apply any lotions/deoderants the morning of surgery.  Please wear clean clothes to the hospital/surgery center.

## 2014-12-27 ENCOUNTER — Inpatient Hospital Stay (HOSPITAL_COMMUNITY)
Admission: RE | Admit: 2014-12-27 | Discharge: 2014-12-27 | Disposition: A | Discharge status: Home / Self Care | Payer: Medicare HMO | Source: Ambulatory Visit

## 2014-12-31 DIAGNOSIS — Z720 Tobacco use: Secondary | ICD-10-CM | POA: Diagnosis not present

## 2014-12-31 DIAGNOSIS — M1612 Unilateral primary osteoarthritis, left hip: Secondary | ICD-10-CM | POA: Diagnosis not present

## 2014-12-31 DIAGNOSIS — Z01818 Encounter for other preprocedural examination: Secondary | ICD-10-CM | POA: Diagnosis not present

## 2015-01-01 ENCOUNTER — Encounter (HOSPITAL_COMMUNITY): Payer: Self-pay

## 2015-01-01 ENCOUNTER — Encounter (HOSPITAL_COMMUNITY)
Admission: RE | Admit: 2015-01-01 | Discharge: 2015-01-01 | Disposition: A | Discharge status: Home / Self Care | Payer: Commercial Managed Care - HMO | Source: Ambulatory Visit | Attending: Orthopedic Surgery | Admitting: Orthopedic Surgery

## 2015-01-01 DIAGNOSIS — M1612 Unilateral primary osteoarthritis, left hip: Secondary | ICD-10-CM | POA: Diagnosis not present

## 2015-01-01 DIAGNOSIS — Z0183 Encounter for blood typing: Secondary | ICD-10-CM | POA: Diagnosis not present

## 2015-01-01 DIAGNOSIS — Z01812 Encounter for preprocedural laboratory examination: Secondary | ICD-10-CM | POA: Diagnosis not present

## 2015-01-01 LAB — COMPREHENSIVE METABOLIC PANEL
ALT: 24 U/L (ref 0–35)
AST: 36 U/L (ref 0–37)
Albumin: 4.2 g/dL (ref 3.5–5.2)
Alkaline Phosphatase: 93 U/L (ref 39–117)
Anion gap: 9 (ref 5–15)
BILIRUBIN TOTAL: 0.6 mg/dL (ref 0.3–1.2)
BUN: 7 mg/dL (ref 6–23)
CALCIUM: 9.8 mg/dL (ref 8.4–10.5)
CO2: 28 mmol/L (ref 19–32)
Chloride: 102 mmol/L (ref 96–112)
Creatinine, Ser: 0.74 mg/dL (ref 0.50–1.10)
GFR calc Af Amer: >90 mL/min (ref >90)
GFR calc non Af Amer: >90 mL/min (ref >90)
GLUCOSE: 89 mg/dL (ref 70–99)
Potassium: 3.7 mmol/L (ref 3.5–5.1)
Sodium: 139 mmol/L (ref 135–145)
TOTAL PROTEIN: 7.1 g/dL (ref 6.0–8.3)

## 2015-01-01 LAB — CBC
HEMATOCRIT: 37.2 % (ref 36.0–46.0)
Hemoglobin: 12.5 g/dL (ref 12.0–15.0)
MCH: 29.6 pg (ref 26.0–34.0)
MCHC: 33.6 g/dL (ref 30.0–36.0)
MCV: 87.9 fL (ref 78.0–100.0)
Platelets: 162 10*3/uL (ref 150–400)
RBC: 4.23 MIL/uL (ref 3.87–5.11)
RDW: 14 % (ref 11.5–15.5)
WBC: 7.6 10*3/uL (ref 4.0–10.5)

## 2015-01-01 LAB — URINE MICROSCOPIC-ADD ON

## 2015-01-01 LAB — URINALYSIS, ROUTINE W REFLEX MICROSCOPIC
GLUCOSE, UA: NEGATIVE mg/dL
Hgb urine dipstick: NEGATIVE
KETONES UR: 15 mg/dL — AB
Nitrite: NEGATIVE
PH: 6 (ref 5.0–8.0)
Protein, ur: NEGATIVE mg/dL
Specific Gravity, Urine: 1.019 (ref 1.005–1.030)
Urobilinogen, UA: 1 mg/dL (ref 0.0–1.0)

## 2015-01-01 LAB — TYPE AND SCREEN
ABO/RH(D): O NEG
Antibody Screen: NEGATIVE

## 2015-01-01 LAB — SURGICAL PCR SCREEN
MRSA, PCR: NEGATIVE
Staphylococcus aureus: POSITIVE — AB

## 2015-01-01 LAB — PROTIME-INR
INR: 1.01 (ref 0.00–1.49)
Prothrombin Time: 13.4 seconds (ref 11.6–15.2)

## 2015-01-01 NOTE — Progress Notes (Signed)
Mupirocin Ointment Rx called into [facility] in [facility] for positive PCR of staph. Left message on pt's VM notifying her.

## 2015-01-03 LAB — URINE CULTURE
COLONY COUNT: NO GROWTH
Culture: NO GROWTH

## 2015-01-06 MED ORDER — DEXTROSE-NACL 5-0.45 % IV SOLN
100.0000 mL/h | INTRAVENOUS | Status: DC
Start: 2015-01-06 — End: 2015-01-07

## 2015-01-06 MED ORDER — CEFAZOLIN SODIUM-DEXTROSE 2-3 GM-% IV SOLR
2.0000 g | INTRAVENOUS | Status: AC
  Administered 2015-01-07: 2 g via INTRAVENOUS
  Filled 2015-01-06: qty 50

## 2015-01-06 MED ORDER — TRANEXAMIC ACID 100 MG/ML IV SOLN
1000.0000 mg | INTRAVENOUS | Status: AC
  Administered 2015-01-07: 1000 mg via INTRAVENOUS
  Filled 2015-01-06: qty 10

## 2015-01-06 MED ORDER — ACETAMINOPHEN 500 MG PO TABS
1000.0000 mg | ORAL_TABLET | Freq: Once | ORAL | Status: AC
  Administered 2015-01-07: 1000 mg via ORAL
  Filled 2015-01-06: qty 2

## 2015-01-06 NOTE — Anesthesia Preprocedure Evaluation (Signed)
Anesthesia Evaluation  Patient identified by MRN, date of birth, ID band Patient awake    Reviewed: Allergy & Precautions, NPO status , Patient's Chart, lab work & pertinent test results  History of Anesthesia Complications Negative for: history of anesthetic complications  Airway Mallampati: II  TM Distance: >3 FB Neck ROM: Full    Dental no notable dental hx. (+) Dental Advisory Given   Pulmonary Current Smoker,  breath sounds clear to auscultation  Pulmonary exam normal       Cardiovascular negative cardio ROS  Rhythm:Regular Rate:Normal     Neuro/Psych  Headaches, negative psych ROS   GI/Hepatic negative GI ROS, Neg liver ROS, Hx of alcoholic pancreatitis   Endo/Other  negative endocrine ROS  Renal/GU negative Renal ROS  negative genitourinary   Musculoskeletal  (+) Arthritis -, Osteoarthritis,    Abdominal   Peds negative pediatric ROS (+)  Hematology negative hematology ROS (+)   Anesthesia Other Findings   Reproductive/Obstetrics negative OB ROS                             Anesthesia Physical Anesthesia Plan  ASA: II  Anesthesia Plan: General   Post-op Pain Management:    Induction: Intravenous  Airway Management Planned: Oral ETT  Additional Equipment:   Intra-op Plan:   Post-operative Plan: Extubation in OR  Informed Consent: I have reviewed the patients History and Physical, chart, labs and discussed the procedure including the risks, benefits and alternatives for the proposed anesthesia with the patient or authorized representative who has indicated his/her understanding and acceptance.   Dental advisory given  Plan Discussed with: CRNA  Anesthesia Plan Comments:         Anesthesia Quick Evaluation

## 2015-01-07 ENCOUNTER — Encounter (HOSPITAL_COMMUNITY): Payer: Self-pay | Admitting: *Deleted

## 2015-01-07 ENCOUNTER — Encounter (HOSPITAL_COMMUNITY)
Admission: RE | Disposition: A | Discharge status: Skilled Nursing Facility | Payer: Self-pay | Source: Ambulatory Visit | Attending: Orthopedic Surgery

## 2015-01-07 ENCOUNTER — Inpatient Hospital Stay (HOSPITAL_COMMUNITY): Payer: Commercial Managed Care - HMO | Admitting: Anesthesiology

## 2015-01-07 ENCOUNTER — Inpatient Hospital Stay (HOSPITAL_COMMUNITY)
Admission: RE | Admit: 2015-01-07 | Discharge: 2015-01-08 | DRG: 470 | Disposition: A | Discharge status: Skilled Nursing Facility | Payer: Commercial Managed Care - HMO | Source: Ambulatory Visit | Attending: Orthopedic Surgery | Admitting: Orthopedic Surgery

## 2015-01-07 ENCOUNTER — Inpatient Hospital Stay (HOSPITAL_COMMUNITY): Payer: Commercial Managed Care - HMO

## 2015-01-07 DIAGNOSIS — Z96642 Presence of left artificial hip joint: Secondary | ICD-10-CM | POA: Diagnosis not present

## 2015-01-07 DIAGNOSIS — Z7982 Long term (current) use of aspirin: Secondary | ICD-10-CM | POA: Diagnosis not present

## 2015-01-07 DIAGNOSIS — M169 Osteoarthritis of hip, unspecified: Secondary | ICD-10-CM | POA: Diagnosis not present

## 2015-01-07 DIAGNOSIS — M1990 Unspecified osteoarthritis, unspecified site: Secondary | ICD-10-CM | POA: Diagnosis present

## 2015-01-07 DIAGNOSIS — Z9889 Other specified postprocedural states: Secondary | ICD-10-CM

## 2015-01-07 DIAGNOSIS — M1612 Unilateral primary osteoarthritis, left hip: Principal | ICD-10-CM | POA: Diagnosis present

## 2015-01-07 DIAGNOSIS — Z471 Aftercare following joint replacement surgery: Secondary | ICD-10-CM | POA: Diagnosis not present

## 2015-01-07 DIAGNOSIS — G43909 Migraine, unspecified, not intractable, without status migrainosus: Secondary | ICD-10-CM | POA: Diagnosis not present

## 2015-01-07 HISTORY — PX: TOTAL HIP ARTHROPLASTY: SHX124

## 2015-01-07 SURGERY — ARTHROPLASTY, HIP, TOTAL, ANTERIOR APPROACH
Anesthesia: General | Site: Hip | Laterality: Left

## 2015-01-07 MED ORDER — LACTATED RINGERS IV SOLN
INTRAVENOUS | Status: DC
  Administered 2015-01-07 (×2): via INTRAVENOUS

## 2015-01-07 MED ORDER — ARTIFICIAL TEARS OP OINT
TOPICAL_OINTMENT | OPHTHALMIC | Status: AC
  Filled 2015-01-07: qty 3.5

## 2015-01-07 MED ORDER — METHOCARBAMOL 1000 MG/10ML IJ SOLN
500.0000 mg | INTRAVENOUS | Status: AC
  Administered 2015-01-07: 500 mg via INTRAVENOUS
  Filled 2015-01-07: qty 5

## 2015-01-07 MED ORDER — MIDAZOLAM HCL 2 MG/2ML IJ SOLN
INTRAMUSCULAR | Status: AC
  Filled 2015-01-07: qty 2

## 2015-01-07 MED ORDER — CHLORHEXIDINE GLUCONATE 4 % EX LIQD
60.0000 mL | Freq: Once | CUTANEOUS | Status: DC
  Filled 2015-01-07: qty 60

## 2015-01-07 MED ORDER — OXYCODONE HCL 5 MG PO TABS
5.0000 mg | ORAL_TABLET | ORAL | Status: DC | PRN
  Administered 2015-01-07 – 2015-01-08 (×9): 10 mg via ORAL
  Filled 2015-01-07 (×8): qty 2

## 2015-01-07 MED ORDER — GLYCOPYRROLATE 0.2 MG/ML IJ SOLN
INTRAMUSCULAR | Status: DC | PRN
  Administered 2015-01-07: 0.6 mg via INTRAVENOUS

## 2015-01-07 MED ORDER — ROCURONIUM BROMIDE 100 MG/10ML IV SOLN
INTRAVENOUS | Status: DC | PRN
  Administered 2015-01-07: 50 mg via INTRAVENOUS

## 2015-01-07 MED ORDER — DOCUSATE SODIUM 100 MG PO CAPS: 100.0000 mg | ORAL_CAPSULE | Freq: Two times a day (BID) | ORAL | Status: DC

## 2015-01-07 MED ORDER — PROPOFOL 10 MG/ML IV BOLUS
INTRAVENOUS | Status: DC | PRN
  Administered 2015-01-07: 70 mg via INTRAVENOUS

## 2015-01-07 MED ORDER — ACETAMINOPHEN 325 MG PO TABS: 650.0000 mg | ORAL_TABLET | Freq: Four times a day (QID) | ORAL | Status: DC | PRN

## 2015-01-07 MED ORDER — DEXAMETHASONE SODIUM PHOSPHATE 10 MG/ML IJ SOLN
10.0000 mg | Freq: Once | INTRAMUSCULAR | Status: AC
  Administered 2015-01-08: 10 mg via INTRAVENOUS
  Filled 2015-01-07: qty 1

## 2015-01-07 MED ORDER — ONDANSETRON HCL 4 MG/2ML IJ SOLN
INTRAMUSCULAR | Status: AC
Start: 2015-01-07 — End: 2015-01-07
  Filled 2015-01-07: qty 2

## 2015-01-07 MED ORDER — FENTANYL CITRATE 0.05 MG/ML IJ SOLN
INTRAMUSCULAR | Status: AC
  Filled 2015-01-07: qty 2

## 2015-01-07 MED ORDER — DEXAMETHASONE SODIUM PHOSPHATE 4 MG/ML IJ SOLN
INTRAMUSCULAR | Status: AC
  Filled 2015-01-07: qty 1

## 2015-01-07 MED ORDER — STERILE WATER FOR INJECTION IJ SOLN
INTRAMUSCULAR | Status: AC
  Filled 2015-01-07: qty 20

## 2015-01-07 MED ORDER — NEOSTIGMINE METHYLSULFATE 10 MG/10ML IV SOLN
INTRAVENOUS | Status: AC
  Filled 2015-01-07: qty 1

## 2015-01-07 MED ORDER — METOCLOPRAMIDE HCL 5 MG/ML IJ SOLN: 5.0000 mg | Freq: Three times a day (TID) | INTRAMUSCULAR | Status: DC | PRN

## 2015-01-07 MED ORDER — METOCLOPRAMIDE HCL 10 MG PO TABS: 5.0000 mg | ORAL_TABLET | Freq: Three times a day (TID) | ORAL | Status: DC | PRN

## 2015-01-07 MED ORDER — LABETALOL HCL 5 MG/ML IV SOLN
INTRAVENOUS | Status: AC
  Filled 2015-01-07: qty 4

## 2015-01-07 MED ORDER — DEXAMETHASONE SODIUM PHOSPHATE 4 MG/ML IJ SOLN
INTRAMUSCULAR | Status: DC | PRN
  Administered 2015-01-07: 4 mg via INTRAVENOUS

## 2015-01-07 MED ORDER — CELECOXIB 200 MG PO CAPS: 200.0000 mg | ORAL_CAPSULE | Freq: Two times a day (BID) | ORAL | Status: DC

## 2015-01-07 MED ORDER — FENTANYL CITRATE 0.05 MG/ML IJ SOLN
INTRAMUSCULAR | Status: AC
  Filled 2015-01-07: qty 5

## 2015-01-07 MED ORDER — ONDANSETRON HCL 4 MG/2ML IJ SOLN: 4.0000 mg | Freq: Once | INTRAMUSCULAR | Status: DC | PRN

## 2015-01-07 MED ORDER — GLYCOPYRROLATE 0.2 MG/ML IJ SOLN
INTRAMUSCULAR | Status: AC
  Filled 2015-01-07: qty 3

## 2015-01-07 MED ORDER — DEXTROSE-NACL 5-0.45 % IV SOLN
INTRAVENOUS | Status: DC
  Administered 2015-01-07: 18:00:00 via INTRAVENOUS

## 2015-01-07 MED ORDER — FENTANYL CITRATE 0.05 MG/ML IJ SOLN
25.0000 ug | INTRAMUSCULAR | Status: DC | PRN
  Administered 2015-01-07 (×4): 25 ug via INTRAVENOUS
  Administered 2015-01-07: 50 ug via INTRAVENOUS

## 2015-01-07 MED ORDER — CELECOXIB 200 MG PO CAPS
200.0000 mg | ORAL_CAPSULE | Freq: Two times a day (BID) | ORAL | Status: DC
  Administered 2015-01-07 – 2015-01-08 (×3): 200 mg via ORAL
  Filled 2015-01-07 (×3): qty 1

## 2015-01-07 MED ORDER — DEXTROSE 5 % IV SOLN
500.0000 mg | Freq: Four times a day (QID) | INTRAVENOUS | Status: DC | PRN
  Filled 2015-01-07: qty 5

## 2015-01-07 MED ORDER — NEOSTIGMINE METHYLSULFATE 10 MG/10ML IV SOLN
INTRAVENOUS | Status: DC | PRN
  Administered 2015-01-07: 4 mg via INTRAVENOUS

## 2015-01-07 MED ORDER — BUPROPION HCL ER (XL) 150 MG PO TB24
300.0000 mg | ORAL_TABLET | Freq: Every day | ORAL | Status: DC
  Administered 2015-01-07 – 2015-01-08 (×2): 300 mg via ORAL
  Filled 2015-01-07 (×3): qty 2

## 2015-01-07 MED ORDER — DOCUSATE SODIUM 100 MG PO CAPS
100.0000 mg | ORAL_CAPSULE | Freq: Two times a day (BID) | ORAL | Status: DC
  Administered 2015-01-07 – 2015-01-08 (×2): 100 mg via ORAL
  Filled 2015-01-07: qty 1

## 2015-01-07 MED ORDER — GABAPENTIN 600 MG PO TABS
600.0000 mg | ORAL_TABLET | Freq: Two times a day (BID) | ORAL | Status: DC
  Administered 2015-01-07 – 2015-01-08 (×3): 600 mg via ORAL
  Filled 2015-01-07 (×6): qty 1

## 2015-01-07 MED ORDER — OXYCODONE HCL 5 MG PO TABS
ORAL_TABLET | ORAL | Status: AC
  Filled 2015-01-07: qty 2

## 2015-01-07 MED ORDER — GABAPENTIN 600 MG PO TABS
1200.0000 mg | ORAL_TABLET | Freq: Every day | ORAL | Status: DC
  Administered 2015-01-07: 1200 mg via ORAL
  Filled 2015-01-07 (×3): qty 2

## 2015-01-07 MED ORDER — PROPOFOL 10 MG/ML IV BOLUS
INTRAVENOUS | Status: AC
  Filled 2015-01-07: qty 20

## 2015-01-07 MED ORDER — ASPIRIN EC 325 MG PO TBEC: 325.0000 mg | DELAYED_RELEASE_TABLET | Freq: Every day | ORAL | Status: DC

## 2015-01-07 MED ORDER — OXYCODONE HCL 5 MG PO TABS: 10.0000 mg | ORAL_TABLET | ORAL | Status: DC | PRN

## 2015-01-07 MED ORDER — SUMATRIPTAN SUCCINATE 100 MG PO TABS
100.0000 mg | ORAL_TABLET | ORAL | Status: DC | PRN
  Filled 2015-01-07: qty 1

## 2015-01-07 MED ORDER — MECLIZINE HCL 25 MG PO TABS
25.0000 mg | ORAL_TABLET | Freq: Three times a day (TID) | ORAL | Status: DC | PRN
  Filled 2015-01-07: qty 1

## 2015-01-07 MED ORDER — LIDOCAINE HCL (CARDIAC) 20 MG/ML IV SOLN
INTRAVENOUS | Status: AC
  Filled 2015-01-07: qty 5

## 2015-01-07 MED ORDER — AMITRIPTYLINE HCL 10 MG PO TABS
15.0000 mg | ORAL_TABLET | Freq: Every day | ORAL | Status: DC
  Administered 2015-01-07: 15 mg via ORAL
  Filled 2015-01-07 (×3): qty 1.5

## 2015-01-07 MED ORDER — HYDROMORPHONE HCL 2 MG PO TABS: 2.0000 mg | ORAL_TABLET | ORAL | Status: DC | PRN

## 2015-01-07 MED ORDER — 0.9 % SODIUM CHLORIDE (POUR BTL) OPTIME
TOPICAL | Status: DC | PRN
  Administered 2015-01-07: 1000 mL

## 2015-01-07 MED ORDER — BACLOFEN 10 MG PO TABS
10.0000 mg | ORAL_TABLET | Freq: Two times a day (BID) | ORAL | Status: DC
  Administered 2015-01-07 – 2015-01-08 (×3): 10 mg via ORAL
  Filled 2015-01-07 (×3): qty 1

## 2015-01-07 MED ORDER — ONDANSETRON HCL 4 MG PO TABS: 4.0000 mg | ORAL_TABLET | Freq: Four times a day (QID) | ORAL | Status: DC | PRN

## 2015-01-07 MED ORDER — MENTHOL 3 MG MT LOZG: 1.0000 | LOZENGE | OROMUCOSAL | Status: DC | PRN

## 2015-01-07 MED ORDER — MIDAZOLAM HCL 5 MG/5ML IJ SOLN
INTRAMUSCULAR | Status: DC | PRN
  Administered 2015-01-07 (×2): 2 mg via INTRAVENOUS

## 2015-01-07 MED ORDER — FENTANYL CITRATE 0.05 MG/ML IJ SOLN
INTRAMUSCULAR | Status: DC | PRN
  Administered 2015-01-07 (×3): 50 ug via INTRAVENOUS
  Administered 2015-01-07: 100 ug via INTRAVENOUS
  Administered 2015-01-07: 50 ug via INTRAVENOUS
  Administered 2015-01-07: 100 ug via INTRAVENOUS

## 2015-01-07 MED ORDER — ACETAMINOPHEN 650 MG RE SUPP: 650.0000 mg | Freq: Four times a day (QID) | RECTAL | Status: DC | PRN

## 2015-01-07 MED ORDER — PHENOL 1.4 % MT LIQD: 1.0000 | OROMUCOSAL | Status: DC | PRN

## 2015-01-07 MED ORDER — METHOCARBAMOL 500 MG PO TABS
500.0000 mg | ORAL_TABLET | Freq: Four times a day (QID) | ORAL | Status: DC | PRN
  Administered 2015-01-07 – 2015-01-08 (×2): 500 mg via ORAL
  Filled 2015-01-07 (×2): qty 1

## 2015-01-07 MED ORDER — EPHEDRINE SULFATE 50 MG/ML IJ SOLN
INTRAMUSCULAR | Status: AC
  Filled 2015-01-07: qty 1

## 2015-01-07 MED ORDER — ONDANSETRON HCL 4 MG PO TABS: 4.0000 mg | ORAL_TABLET | Freq: Three times a day (TID) | ORAL | Status: DC | PRN

## 2015-01-07 MED ORDER — LIDOCAINE HCL (CARDIAC) 20 MG/ML IV SOLN
INTRAVENOUS | Status: DC | PRN
  Administered 2015-01-07: 50 mg via INTRAVENOUS
  Administered 2015-01-07: 50 mg via INTRATRACHEAL

## 2015-01-07 MED ORDER — HYDROMORPHONE HCL 1 MG/ML IJ SOLN
1.0000 mg | INTRAMUSCULAR | Status: DC | PRN
  Administered 2015-01-07 – 2015-01-08 (×3): 1 mg via INTRAVENOUS
  Filled 2015-01-07 (×3): qty 1

## 2015-01-07 MED ORDER — ASPIRIN EC 325 MG PO TBEC
325.0000 mg | DELAYED_RELEASE_TABLET | Freq: Every day | ORAL | Status: DC
  Administered 2015-01-08: 325 mg via ORAL
  Filled 2015-01-07: qty 1

## 2015-01-07 MED ORDER — EPHEDRINE SULFATE 50 MG/ML IJ SOLN
INTRAMUSCULAR | Status: DC | PRN
  Administered 2015-01-07: 10 mg via INTRAVENOUS

## 2015-01-07 MED ORDER — ONDANSETRON HCL 4 MG/2ML IJ SOLN: 4.0000 mg | Freq: Four times a day (QID) | INTRAMUSCULAR | Status: DC | PRN

## 2015-01-07 MED ORDER — OXYCODONE-ACETAMINOPHEN 5-325 MG PO TABS: 2.0000 | ORAL_TABLET | ORAL | Status: DC | PRN

## 2015-01-07 MED ORDER — GABAPENTIN 300 MG PO CAPS: 600.0000 mg | ORAL_CAPSULE | Freq: Three times a day (TID) | ORAL | Status: DC

## 2015-01-07 MED ORDER — ONDANSETRON HCL 4 MG/2ML IJ SOLN
INTRAMUSCULAR | Status: DC | PRN
  Administered 2015-01-07: 4 mg via INTRAVENOUS

## 2015-01-07 MED ORDER — CEFAZOLIN SODIUM-DEXTROSE 2-3 GM-% IV SOLR
2.0000 g | Freq: Four times a day (QID) | INTRAVENOUS | Status: AC
  Administered 2015-01-07 – 2015-01-08 (×2): 2 g via INTRAVENOUS
  Filled 2015-01-07 (×3): qty 50

## 2015-01-07 SURGICAL SUPPLY — 69 items
ADH SKN CLS APL DERMABOND .7 (GAUZE/BANDAGES/DRESSINGS) ×1
BLADE SAW SGTL 18X1.27X75 (BLADE) ×2 IMPLANT
BLADE SAW SGTL 18X1.27X75MM (BLADE) ×1
BLADE SURG ROTATE 9660 (MISCELLANEOUS) IMPLANT
CAPT HIP TOTAL 2 ×2 IMPLANT
COVER SURGICAL LIGHT HANDLE (MISCELLANEOUS) ×3 IMPLANT
DERMABOND ADVANCED (GAUZE/BANDAGES/DRESSINGS) ×2
DERMABOND ADVANCED .7 DNX12 (GAUZE/BANDAGES/DRESSINGS) IMPLANT
DRAPE C-ARM 42X72 X-RAY (DRAPES) ×3 IMPLANT
DRAPE IMP U-DRAPE 54X76 (DRAPES) ×6 IMPLANT
DRAPE INCISE IOBAN 66X45 STRL (DRAPES) ×3 IMPLANT
DRAPE ORTHO SPLIT 77X108 STRL (DRAPES) ×6
DRAPE PROXIMA HALF (DRAPES) ×3 IMPLANT
DRAPE SURG 17X23 STRL (DRAPES) ×3 IMPLANT
DRAPE SURG ORHT 6 SPLT 77X108 (DRAPES) ×2 IMPLANT
DRAPE U-SHAPE 47X51 STRL (DRAPES) ×6 IMPLANT
DRSG AQUACEL AG ADV 3.5X10 (GAUZE/BANDAGES/DRESSINGS) ×3 IMPLANT
DRSG MEPILEX BORDER 4X8 (GAUZE/BANDAGES/DRESSINGS) ×2 IMPLANT
DURAPREP 26ML APPLICATOR (WOUND CARE) ×3 IMPLANT
ELECT BLADE 4.0 EZ CLEAN MEGAD (MISCELLANEOUS) ×3
ELECT BLADE 6.5 EXT (BLADE) ×2 IMPLANT
ELECT CAUTERY BLADE 6.4 (BLADE) ×3 IMPLANT
ELECT REM PT RETURN 9FT ADLT (ELECTROSURGICAL) ×3
ELECTRODE BLDE 4.0 EZ CLN MEGD (MISCELLANEOUS) ×1 IMPLANT
ELECTRODE REM PT RTRN 9FT ADLT (ELECTROSURGICAL) ×1 IMPLANT
FACESHIELD WRAPAROUND (MASK) ×6 IMPLANT
FACESHIELD WRAPAROUND OR TEAM (MASK) ×2 IMPLANT
GLOVE BIO SURGEON STRL SZ7 (GLOVE) ×2 IMPLANT
GLOVE BIO SURGEON STRL SZ7.5 (GLOVE) ×3 IMPLANT
GLOVE BIOGEL M 7.0 STRL (GLOVE) IMPLANT
GLOVE BIOGEL PI IND STRL 6.5 (GLOVE) IMPLANT
GLOVE BIOGEL PI IND STRL 7.0 (GLOVE) IMPLANT
GLOVE BIOGEL PI IND STRL 7.5 (GLOVE) IMPLANT
GLOVE BIOGEL PI IND STRL 8 (GLOVE) ×2 IMPLANT
GLOVE BIOGEL PI INDICATOR 6.5 (GLOVE) ×2
GLOVE BIOGEL PI INDICATOR 7.0 (GLOVE) ×2
GLOVE BIOGEL PI INDICATOR 7.5 (GLOVE)
GLOVE BIOGEL PI INDICATOR 8 (GLOVE) ×4
GLOVE BIOGEL PI ORTHO PRO SZ8 (GLOVE)
GLOVE PI ORTHO PRO STRL SZ8 (GLOVE) IMPLANT
GLOVE SURG ORTHO 8.0 STRL STRW (GLOVE) IMPLANT
GOWN STRL REUS W/ TWL LRG LVL3 (GOWN DISPOSABLE) ×3 IMPLANT
GOWN STRL REUS W/ TWL XL LVL3 (GOWN DISPOSABLE) ×1 IMPLANT
GOWN STRL REUS W/TWL LRG LVL3 (GOWN DISPOSABLE) ×9
GOWN STRL REUS W/TWL XL LVL3 (GOWN DISPOSABLE) ×3
KIT BASIN OR (CUSTOM PROCEDURE TRAY) ×3 IMPLANT
KIT ROOM TURNOVER OR (KITS) ×3 IMPLANT
MANIFOLD NEPTUNE II (INSTRUMENTS) ×3 IMPLANT
NDL 18GX1X1/2 (RX/OR ONLY) (NEEDLE) ×1 IMPLANT
NDL SAFETY ECLIPSE 18X1.5 (NEEDLE) ×1 IMPLANT
NEEDLE 18GX1X1/2 (RX/OR ONLY) (NEEDLE) ×3 IMPLANT
NEEDLE HYPO 18GX1.5 SHARP (NEEDLE) ×3
NS IRRIG 1000ML POUR BTL (IV SOLUTION) ×3 IMPLANT
PACK TOTAL JOINT (CUSTOM PROCEDURE TRAY) ×3 IMPLANT
PACK UNIVERSAL I (CUSTOM PROCEDURE TRAY) ×3 IMPLANT
PAD ARMBOARD 7.5X6 YLW CONV (MISCELLANEOUS) ×6 IMPLANT
SPONGE LAP 18X18 X RAY DECT (DISPOSABLE) IMPLANT
SUT MNCRL AB 4-0 PS2 18 (SUTURE) ×3 IMPLANT
SUT MON AB 2-0 CT1 36 (SUTURE) ×5 IMPLANT
SUT VIC AB 0 CT1 27 (SUTURE) ×3
SUT VIC AB 0 CT1 27XBRD ANBCTR (SUTURE) ×1 IMPLANT
SUT VIC AB 1 CT1 27 (SUTURE)
SUT VIC AB 1 CT1 27XBRD ANBCTR (SUTURE) ×1 IMPLANT
SYR 50ML LL SCALE MARK (SYRINGE) ×3 IMPLANT
TOWEL OR 17X24 6PK STRL BLUE (TOWEL DISPOSABLE) ×3 IMPLANT
TOWEL OR 17X26 10 PK STRL BLUE (TOWEL DISPOSABLE) ×3 IMPLANT
TOWEL OR NON WOVEN STRL DISP B (DISPOSABLE) ×3 IMPLANT
TRAY FOLEY CATH 16FRSI W/METER (SET/KITS/TRAYS/PACK) IMPLANT
WATER STERILE IRR 1000ML POUR (IV SOLUTION) ×3 IMPLANT

## 2015-01-07 NOTE — Anesthesia Postprocedure Evaluation (Signed)
  Anesthesia Post-op Note  Patient: [name]  Procedure(s) Performed: Procedure(s) (LRB): TOTAL HIP ARTHROPLASTY ANTERIOR APPROACH (Left)  Patient Location: PACU  Anesthesia Type: General  Level of Consciousness: awake and alert   Airway and Oxygen Therapy: Patient Spontanous Breathing  Post-op Pain: mild  Post-op Assessment: Post-op Vital signs reviewed, Patient's Cardiovascular Status Stable, Respiratory Function Stable, Patent Airway and No signs of Nausea or vomiting  Last Vitals:  Filed Vitals:   01/07/15 1345  BP: 128/74  Pulse: 73  Temp: 36.9 C  Resp: 14    Post-op Vital Signs: stable   Complications: No apparent anesthesia complications

## 2015-01-07 NOTE — Evaluation (Addendum)
Physical Therapy Evaluation Patient Details Name: [name] MRN: [id number] DOB: 27-Mar-1961 Today's Date: 01/07/2015   History of Present Illness  adm for Lt direct anterior THA PMHx-Lt TKR, cervical fusion, wrist surgery  Clinical Impression  Pt is s/p THA resulting in the deficits listed below (see PT Problem List). Limited on evaluation due to hypotension (likely secondary to both IV and po pain meds ~30 minutes prior to PT). Pt will benefit from skilled PT to increase their independence and safety with mobility to allow discharge to the venue listed below.      Follow Up Recommendations Home health PT;Supervision/Assistance - 24 hour    Equipment Recommendations  None recommended by PT    Recommendations for Other Services       Precautions / Restrictions Precautions Precautions: None Restrictions Weight Bearing Restrictions: Yes LLE Weight Bearing: Weight bearing as tolerated      Mobility  Bed Mobility Overal bed mobility: Needs Assistance Bed Mobility: Supine to Sit     Supine to sit: Min assist;HOB elevated (HOB 20)     General bed mobility comments: HOB remained elevated due to low BP prior to bed exercises; assist to LLE  Transfers Overall transfer level: Needs assistance Equipment used: Rolling walker (2 wheeled) Transfers: Sit to/from Stand Sit to Stand: Min assist         General transfer comment: vc for safe use of RW; steady assist   Ambulation/Gait Ambulation/Gait assistance: Min assist Ambulation Distance (Feet): 2 Feet Assistive device: Rolling walker (2 wheeled) Gait Pattern/deviations: Step-to pattern     General Gait Details: limited distance due to dizziness; vc for safe use of RW and sequencing  Stairs            Wheelchair Mobility    Modified Rankin (Stroke Patients Only)       Balance                                             Pertinent Vitals/Pain Supine BP 86/58; after exercises  107/67 Sitting/reclined after bed to chair 78/52; after 2 minutes 120/75  Pain Assessment: 0-10 Pain Score: 7  Pain Location: Lt ant hip Pain Intervention(s): Limited activity within patient's tolerance;Monitored during session;Premedicated before session;Repositioned    Home Living Family/patient expects to be discharged to:: Private residence Living Arrangements: Spouse/significant other (fiance) Available Help at Discharge: Family;Available PRN/intermittently (daughter also to assist) Type of Home: House Home Access: Stairs to enter Entrance Stairs-Rails: None (nearby shelf) Technical brewer of Steps: 2 Home Layout: One level Home Equipment: Cane - single point;Walker - 2 wheels      Prior Function Level of Independence: Independent with assistive device(s)         Comments: using cane 1 week PTA     Hand Dominance        Extremity/Trunk Assessment   Upper Extremity Assessment: Overall WFL for tasks assessed           Lower Extremity Assessment: LLE deficits/detail   LLE Deficits / Details: post-surgical pain; hip flexion to 80, knee flexion to 90  Cervical / Trunk Assessment: Normal  Communication   Communication: No difficulties  Cognition Arousal/Alertness: Lethargic;Suspect due to medications Behavior During Therapy: [facility] for tasks assessed/performed Overall Cognitive Status: Within Functional Limits for tasks assessed  General Comments      Exercises Total Joint Exercises Ankle Circles/Pumps: AROM;Both;15 reps Quad Sets: AROM;Right;5 reps;Left;10 reps      Assessment/Plan    PT Assessment Patient needs continued PT services  PT Diagnosis Difficulty walking;Acute pain   PT Problem List Decreased range of motion;Decreased activity tolerance;Decreased balance;Decreased mobility;Decreased knowledge of use of DME;Pain  PT Treatment Interventions DME instruction;Gait training;Stair training;Functional mobility  training;Therapeutic activities;Therapeutic exercise;Patient/family education   PT Goals (Current goals can be found in the Care Plan section) Acute Rehab PT Goals Patient Stated Goal: walking better PT Goal Formulation: With patient Time For Goal Achievement: 01/09/15 Potential to Achieve Goals: Good    Frequency 7X/week   Barriers to discharge        Co-evaluation               End of Session Equipment Utilized During Treatment: Gait belt;Oxygen Activity Tolerance: Treatment limited secondary to medical complications (Comment) (dizziness and hypotension) Patient left: in chair;with call bell/phone within reach Nurse Communication: Mobility status;Other (comment) (no hip precautions)         Time: 5248-1859 PT Time Calculation (min) (ACUTE ONLY): 24 min   Charges:   PT Evaluation $Initial PT Evaluation Tier I: 1 Procedure PT Treatments $Gait Training: 8-22 mins   PT G Codes:        Jaziah Goeller 01/22/2015, 4:33 PM Pager [phone]

## 2015-01-07 NOTE — Discharge Instructions (Signed)
Bear weight as tolerated  Take ASA 325 daily for 30 days  Keep your dressing dry and covered

## 2015-01-07 NOTE — Discharge Summary (Signed)
Physician Discharge Summary  Patient ID: [name] MRN: [id number] DOB/AGE: 1961/06/04 54 y.o.  Admit date: 01/07/2015 Discharge date: 01/07/2015  Admission Diagnoses:  <principal problem not specified>  Discharge Diagnoses:  Active Problems:   DJD (degenerative joint disease)   Past Medical History  Diagnosis Date  . Migraine   . Osteoarthritis   . Alcoholic pancreatitis 12/5850    01/15/13    Surgeries: Procedure(s): TOTAL HIP ARTHROPLASTY ANTERIOR APPROACH on 01/07/2015   Consultants (if any):    Discharged Condition: Improved  Hospital Course: AVRIEL KANDEL is an 54 y.o. female who was admitted 01/07/2015 with a diagnosis of <principal problem not specified> and went to the operating room on 01/07/2015 and underwent the above named procedures.    She was given perioperative antibiotics:  Anti-infectives    Start     Dose/Rate Route Frequency Ordered Stop   01/07/15 1445  ceFAZolin (ANCEF) IVPB 2 g/50 mL premix     2 g 100 mL/hr over 30 Minutes Intravenous Every 6 hours 01/07/15 1430 01/08/15 0244   01/07/15 0600  ceFAZolin (ANCEF) IVPB 2 g/50 mL premix     2 g 100 mL/hr over 30 Minutes Intravenous On call to O.R. 01/06/15 1355 01/07/15 0945    .  She was given sequential compression devices, early ambulation, and ASA 325 for DVT prophylaxis.  She benefited maximally from the hospital stay and there were no complications.    Recent vital signs:  Filed Vitals:   01/07/15 1400  BP: 117/71  Pulse: 88  Temp: 97.4 F (36.3 C)  Resp: 16    Recent laboratory studies:  Lab Results  Component Value Date   HGB 12.5 01/01/2015   HGB 13.1 07/23/2009   HGB 12.6 05/21/2009   Lab Results  Component Value Date   WBC 7.6 01/01/2015   PLT 162 01/01/2015   Lab Results  Component Value Date   INR 1.01 01/01/2015   Lab Results  Component Value Date   NA 139 01/01/2015   K 3.7 01/01/2015   CL 102 01/01/2015   CO2 28 01/01/2015   BUN 7 01/01/2015   CREATININE 0.74 01/01/2015   GLUCOSE 89 01/01/2015    Discharge Medications:     Medication List    TAKE these medications        aspirin EC 325 MG tablet  Take 1 tablet (325 mg total) by mouth daily.     celecoxib 200 MG capsule  Commonly known as:  CELEBREX  Take 1 capsule (200 mg total) by mouth 2 (two) times daily.     docusate sodium 100 MG capsule  Commonly known as:  COLACE  Take 1 capsule (100 mg total) by mouth 2 (two) times daily. Continue this while taking narcotics to help with bowel movements     ondansetron 4 MG tablet  Commonly known as:  ZOFRAN  Take 1 tablet (4 mg total) by mouth every 8 (eight) hours as needed for nausea.     oxyCODONE 5 MG immediate release tablet  Commonly known as:  ROXICODONE  Take 2 tablets (10 mg total) by mouth every 4 (four) hours as needed for severe pain.      ASK your doctor about these medications        amitriptyline 10 MG tablet  Commonly known as:  ELAVIL  Take 1.5 tablets (15 mg total) by mouth at bedtime.     baclofen 10 MG tablet  Commonly known as:  LIORESAL  Take 10  mg by mouth 2 (two) times daily.     buPROPion 300 MG 24 hr tablet  Commonly known as:  WELLBUTRIN XL  Take 300 mg by mouth daily.     CALCIUM/VITAMIN D/MINERALS 600-200 MG-UNIT Tabs  Take by mouth.     DILAUDID 2 MG tablet  Generic drug:  HYDROmorphone  Take 2 mg by mouth every 6 (six) hours as needed for severe pain.     fentaNYL 50 MCG/HR  Commonly known as:  DURAGESIC - dosed mcg/hr  Place 50 mcg onto the skin every other day.     Fish Oil 1000 MG Caps  Take 1,000 mg by mouth.     gabapentin 300 MG capsule  Commonly known as:  NEURONTIN  Take 600-1,200 mg by mouth 3 (three) times daily. 2 TAB IN THE MORNING, 2 TAB AT LUNCH, 4 TABS AT BEDTIME     meclizine 25 MG tablet  Commonly known as:  ANTIVERT  Take 25 mg by mouth 3 (three) times daily as needed for dizziness.     meloxicam 15 MG tablet  Commonly known as:  MOBIC  Take 15 mg  by mouth daily.     MULTIVITAMIN PO  Take by mouth.     SUMAtriptan 100 MG tablet  Commonly known as:  IMITREX  Take 100 mg by mouth every 2 (two) hours as needed for migraine.     VITAMIN B 12 PO  Take 500 mcg by mouth.        Diagnostic Studies: Dg Pelvis Portable  01/07/2015   CLINICAL DATA:  Left total hip replacement  EXAM: PORTABLE PELVIS 1-2 VIEWS  COMPARISON:  No preoperative imaging  FINDINGS: Evidence of left total hip arthroplasty noted. No evidence for hardware failure. No fracture line identified. Soft tissue gas is noted.  IMPRESSION: Expected postoperative appearance after left total hip arthroplasty.   Electronically Signed   By: Conchita Paris M.D.   On: 01/07/2015 13:08    Disposition:         Follow-up Information    Follow up with Renette Butters, MD.   Specialty:  Orthopedic Surgery   Why:  as scheduled   Contact information:   [address] [address] [phone]        Signed: [name], D 01/07/2015, 3:50 PM

## 2015-01-07 NOTE — Anesthesia Procedure Notes (Signed)
Procedure Name: Intubation Date/Time: 01/07/2015 9:44 AM Performed by: [name] Pre-anesthesia Checklist: Patient identified, Emergency Drugs available, Suction available, Patient being monitored and Timeout performed Patient Re-evaluated:Patient Re-evaluated prior to inductionOxygen Delivery Method: Circle system utilized Preoxygenation: Pre-oxygenation with 100% oxygen Intubation Type: IV induction Ventilation: Mask ventilation without difficulty Laryngoscope Size: Mac and 3 Grade View: Grade I Tube type: Oral Tube size: 7.0 mm Number of attempts: 1 Airway Equipment and Method: Stylet and LTA kit utilized Placement Confirmation: ETT inserted through vocal cords under direct vision,  positive ETCO2 and breath sounds checked- equal and bilateral Secured at: 20 cm Tube secured with: Tape Dental Injury: Teeth and Oropharynx as per pre-operative assessment

## 2015-01-07 NOTE — Interval H&P Note (Signed)
History and Physical Interval Note:  01/07/2015 7:15 AM  [name]  has presented today for surgery, with the diagnosis of djd left hip  The various methods of treatment have been discussed with the patient and family. After consideration of risks, benefits and other options for treatment, the patient has consented to  Procedure(s): TOTAL HIP ARTHROPLASTY ANTERIOR APPROACH (Left) as a surgical intervention .  The patient's history has been reviewed, patient examined, no change in status, stable for surgery.  I have reviewed the patient's chart and labs.  Questions were answered to the patient's satisfaction.     [name], D

## 2015-01-07 NOTE — Progress Notes (Signed)
Utilization review completed.  

## 2015-01-07 NOTE — Op Note (Signed)
01/07/2015  11:51 AM  PATIENT:  [name]   MRN: [id number]  PRE-OPERATIVE DIAGNOSIS:  degenerative joint disease left hip  POST-OPERATIVE DIAGNOSIS:  degenerative joint disease left hip  PROCEDURE:  Procedure(s): TOTAL HIP ARTHROPLASTY ANTERIOR APPROACH  PREOPERATIVE INDICATIONS:    [name] is an 54 y.o. female who has a diagnosis of <principal problem not specified> and elected for surgical management after failing conservative treatment.  The risks benefits and alternatives were discussed with the patient including but not limited to the risks of nonoperative treatment, versus surgical intervention including infection, bleeding, nerve injury, periprosthetic fracture, the need for revision surgery, dislocation, leg length discrepancy, blood clots, cardiopulmonary complications, morbidity, mortality, among others, and they were willing to proceed.     OPERATIVE REPORT     SURGEON:   Renette Butters, MD    ASSISTANT:  Lovett Calender, PA-C, She was present and scrubbed throughout the case, critical for completion in a timely fashion, and for retraction, instrumentation, and closure.     ANESTHESIA:  General    COMPLICATIONS:  None.     COMPONENTS:  [employer] acolade fit femur size 3 with a 36 mm -2.5 head ball and a PSL acetabular shell size 54 with a  polyethylene liner    PROCEDURE IN DETAIL:   The patient was met in the holding area and  identified.  The appropriate hip was identified and marked at the operative site.  The patient was then transported to the OR  and  placed under general anesthesia.  At that point, the patient was  placed in the supine position and  secured to the operating room table and all bony prominences padded. He received pre-operative antibiotics    The operative lower extremity was prepped from the iliac crest to the distal leg.  Sterile draping was performed.  Time out was performed prior to incision.      Skin incision was made just 2  cm lateral to the ASIS  extending in line with the tensor fascia lata. Electrocautery was used to control all bleeders. I dissected down sharply to the fascia of the tensor fascia lata was confirmed that the muscle fibers beneath were running posteriorly. I then incised the fascia over the superficial tensor fascia lata in line with the incision. The fascia was elevated off the anterior aspect of the muscle the muscle was retracted posteriorly and protected throughout the case. I then used electrocautery to incise the tensor fascia lata fascia control and all bleeders. Immediately visible was the fat over top of the anterior neck and capsule.  I removed the anterior fat from the capsule and elevated the rectus muscle off of the anterior capsule. I then removed a large time of capsule. The retractors were then placed over the anterior acetabulum as well as around the superior and inferior neck.  I then removed a section of the femoral neck and a napkin ring fashion. Then used the power course to remove the femoral head from the acetabulum and thoroughly irrigated the acetabulum. I sized the femoral head.    I then exposed the deep acetabulum, cleared out any tissue including the ligamentum teres.   After adequate visualization, I excised the labrum, and then sequentially reamed.  I placed the trial acetabulum, which seated nicely, and then impacted the real cup into place.  Appropriate version and inclination was confirmed clinically matching their bony anatomy, and also with the use transverse acetabular ligament.  I placed a 43m screw  in the posterior/superio position with an excellent bite.    I then placed the polyethylene liner in place  I then abducted the leg and released the external rotators from the posterior femur allowing it to be easily delivered up lateral and anterior to the acetabulum for preparation of the femoral canal.    I then prepared the proximal femur using the cookie-cutter and  then sequentially reamed and broached.  A trial broach, neck, and head was utilized, and I reduced the hip and it was found to have excellent stability with functional range of motion..  I then impacted the real femoral prosthesis into place into the appropriate version, slightly anteverted to the normal anatomy, and I impacted the real head ball into place. The hip was then reduced and taken through functional range of motion and found to have excellent stability. Leg lengths were restored.  I then irrigated the hip copiously again with, and repaired the fascia with Vicryl, followed by monocryl for the subcutaneous tissue, Monocryl for the skin, Steri-Strips and sterile gauze. The wounds were injected. The patient was then awakened and returned to PACU in stable and satisfactory condition. There were no complications.  POST OPERATIVE PLAN: WBAT, DVT px: SCD's/TED and ASA 325  Lynn Lynch, MD Orthopedic Surgeon [phone]   This note was generated using a template and dragon dictation system. In light of that, I have reviewed the note and all aspects of it are applicable to this case. Any dictation errors are due to the computerized dictation system.

## 2015-01-07 NOTE — Transfer of Care (Signed)
Immediate Anesthesia Transfer of Care Note  Patient: [name]  Procedure(s) Performed: Procedure(s): TOTAL HIP ARTHROPLASTY ANTERIOR APPROACH (Left)  Patient Location: PACU  Anesthesia Type:General  Level of Consciousness: awake, alert  and oriented  Airway & Oxygen Therapy: Patient Spontanous Breathing and Patient connected to nasal cannula oxygen  Post-op Assessment: Report given to RN and Post -op Vital signs reviewed and stable  Post vital signs: Reviewed and stable  Last Vitals:  Filed Vitals:   01/07/15 1139  BP:   Pulse: 84  Temp: 36.3 C  Resp: 13    Complications: No apparent anesthesia complications

## 2015-01-08 MED ORDER — DEXTROSE 5 % AND 0.45 % NACL IV BOLUS
1000.0000 mL | Freq: Once | INTRAVENOUS | Status: AC
Start: 2015-01-08 — End: 2015-01-08
  Administered 2015-01-08: 1000 mL via INTRAVENOUS

## 2015-01-08 NOTE — Discharge Summary (Signed)
Physician Discharge Summary  Patient ID: [name] MRN: [id number] DOB/AGE: 53/25/1962 53 y.o.  Admit date: 01/07/2015 Discharge date: 01/08/2015  Admission Diagnoses:  <principal problem not specified>  Discharge Diagnoses:  Active Problems:   DJD (degenerative joint disease) Primary osteoarthritis  Past Medical History  Diagnosis Date  . Migraine   . Osteoarthritis   . Alcoholic pancreatitis 49/4496    01/15/13     Surgeries: Procedure(s): TOTAL HIP ARTHROPLASTY ANTERIOR APPROACH on 01/07/2015   Consultants (if any):    Discharged Condition: Improved  Hospital Course: [name] is an 54 y.o. female who was admitted 01/07/2015 with a diagnosis of <principal problem not specified> and went to the operating room on 01/07/2015 and underwent the above named procedures.    She was given perioperative antibiotics:  Anti-infectives    Start     Dose/Rate Route Frequency Ordered Stop   01/07/15 1445  ceFAZolin (ANCEF) IVPB 2 g/50 mL premix     2 g 100 mL/hr over 30 Minutes Intravenous Every 6 hours 01/07/15 1430 01/08/15 0120   01/07/15 0600  ceFAZolin (ANCEF) IVPB 2 g/50 mL premix     2 g 100 mL/hr over 30 Minutes Intravenous On call to O.R. 01/06/15 1355 01/07/15 0945    .  She was given sequential compression devices, early ambulation, and ASA 325mg  for DVT prophylaxis.  She benefited maximally from the hospital stay and there were no complications.    Recent vital signs:  Filed Vitals:   01/08/15 0501  BP: 95/49  Pulse: 86  Temp: 99.4 F (37.4 C)  Resp: 17    Recent laboratory studies:  Lab Results  Component Value Date   HGB 12.5 01/01/2015   HGB 13.1 07/23/2009   HGB 12.6 05/21/2009   Lab Results  Component Value Date   WBC 7.6 01/01/2015   PLT 162 01/01/2015   Lab Results  Component Value Date   INR 1.01 01/01/2015   Lab Results  Component Value Date   NA 139 01/01/2015   K 3.7 01/01/2015   CL 102 01/01/2015   CO2 28 01/01/2015    BUN 7 01/01/2015   CREATININE 0.74 01/01/2015   GLUCOSE 89 01/01/2015    Discharge Medications:     Medication List    TAKE these medications        amitriptyline 10 MG tablet  Commonly known as:  ELAVIL  Take 1.5 tablets (15 mg total) by mouth at bedtime.     aspirin EC 325 MG tablet  Take 1 tablet (325 mg total) by mouth daily.     baclofen 10 MG tablet  Commonly known as:  LIORESAL  Take 10 mg by mouth 2 (two) times daily.     buPROPion 300 MG 24 hr tablet  Commonly known as:  WELLBUTRIN XL  Take 300 mg by mouth daily.     CALCIUM/VITAMIN D/MINERALS 600-200 MG-UNIT Tabs  Take by mouth.     celecoxib 200 MG capsule  Commonly known as:  CELEBREX  Take 1 capsule (200 mg total) by mouth 2 (two) times daily.     DILAUDID 2 MG tablet  Generic drug:  HYDROmorphone  Take 2 mg by mouth every 6 (six) hours as needed for severe pain.     docusate sodium 100 MG capsule  Commonly known as:  COLACE  Take 1 capsule (100 mg total) by mouth 2 (two) times daily. Continue this while taking narcotics to help with bowel movements  docusate sodium 100 MG capsule  Commonly known as:  COLACE  Take 1 capsule (100 mg total) by mouth 2 (two) times daily. Continue this while taking narcotics to help with bowel movements     fentaNYL 50 MCG/HR  Commonly known as:  DURAGESIC - dosed mcg/hr  Place 50 mcg onto the skin every other day.     Fish Oil 1000 MG Caps  Take 1,000 mg by mouth.     gabapentin 300 MG capsule  Commonly known as:  NEURONTIN  Take 600-1,200 mg by mouth 3 (three) times daily. 2 TAB IN THE MORNING, 2 TAB AT LUNCH, 4 TABS AT BEDTIME     meclizine 25 MG tablet  Commonly known as:  ANTIVERT  Take 25 mg by mouth 3 (three) times daily as needed for dizziness.     meloxicam 15 MG tablet  Commonly known as:  MOBIC  Take 15 mg by mouth daily.     MULTIVITAMIN PO  Take by mouth.     ondansetron 4 MG tablet  Commonly known as:  ZOFRAN  Take 1 tablet (4 mg total)  by mouth every 8 (eight) hours as needed for nausea.     oxyCODONE 5 MG immediate release tablet  Commonly known as:  ROXICODONE  Take 2 tablets (10 mg total) by mouth every 4 (four) hours as needed for severe pain.     oxyCODONE-acetaminophen 5-325 MG per tablet  Commonly known as:  ROXICET  Take 2 tablets by mouth every 4 (four) hours as needed.     SUMAtriptan 100 MG tablet  Commonly known as:  IMITREX  Take 100 mg by mouth every 2 (two) hours as needed for migraine.     VITAMIN B 12 PO  Take 500 mcg by mouth.        Diagnostic Studies: Dg Pelvis Portable  01/07/2015   CLINICAL DATA:  Left total hip replacement  EXAM: PORTABLE PELVIS 1-2 VIEWS  COMPARISON:  No preoperative imaging  FINDINGS: Evidence of left total hip arthroplasty noted. No evidence for hardware failure. No fracture line identified. Soft tissue gas is noted.  IMPRESSION: Expected postoperative appearance after left total hip arthroplasty.   Electronically Signed   By: Conchita Paris M.D.   On: 01/07/2015 13:08    Disposition:         Follow-up Information    Follow up with Renette Butters, MD.   Specialty:  Orthopedic Surgery   Why:  as scheduled   Contact information:   [address] [place] [address] [phone]        Signed: Gae Dry 01/08/2015, 8:18 AM

## 2015-01-08 NOTE — Evaluation (Signed)
Occupational Therapy Evaluation Patient Details Name: [name] MRN: [id number] DOB: 1961-03-11 Today's Date: 01/08/2015    History of Present Illness adm for Lt direct anterior THA PMHx-Lt TKR, cervical fusion, wrist surgery   Clinical Impression   Patient independent>mod I PTA. Patient currently requires up to mod assist for LB ADLs and up to min assist for functional mobility/transfers. Patient will benefit from acute OT to increase overall independence in the areas of ADLs, functional mobility, overall safety in order to safely discharge home with assist from fiance and daughter.     Follow Up Recommendations  Home health OT;Supervision/Assistance - 24 hour    Equipment Recommendations  3 in 1 bedside comode    Recommendations for Other Services  None at this time     Precautions / Restrictions Precautions Precautions: None Restrictions Weight Bearing Restrictions: Yes LLE Weight Bearing: Weight bearing as tolerated      Mobility Bed Mobility Overal bed mobility: Needs Assistance Bed Mobility: Supine to Sit     Supine to sit: Supervision     General bed mobility comments: No physical assistance needed  Transfers Overall transfer level: Needs assistance Equipment used: Rolling walker (2 wheeled) Transfers: Sit to/from Stand Sit to Stand: Supervision         General transfer comment: cues needed for safety and technique     Balance Overall balance assessment: Needs assistance Sitting-balance support: Feet supported;No upper extremity supported Sitting balance-Leahy Scale: Good     Standing balance support: Bilateral upper extremity supported;During functional activity Standing balance-Leahy Scale: Fair     ADL Overall ADL's : Needs assistance/impaired     Grooming: Supervision/safety;Standing   Upper Body Bathing: Set up;Sitting   Lower Body Bathing: Moderate assistance;Sit to/from stand   Upper Body Dressing : Set up;Sitting   Lower  Body Dressing: Moderate assistance;Sit to/from stand   Toilet Transfer: Min guard;BSC;RW;Ambulation   Toileting- Water quality scientist and Hygiene: Supervision/safety;Sit to/from stand   Tub/ Shower Transfer: Modified independent;Ambulation;3 in 1     General ADL Comments: Patient with increased pain and some complaints of dizziness during eval. Patient unable to reach > LLE or cross for LB ADLs. Educated patient on AE (reacher, sock aid, LH sponge, LH shoe horn) for LB ADLs. Patient required frequent cues for safety and correct technique during functional ambulation and transfers. Wil focus on demonstration and teach back regarding AE and functional shower transfers next OT session.     Pertinent Vitals/Pain Pain Assessment: 0-10 Pain Score: 7  Pain Location: left hip Pain Descriptors / Indicators: Aching Pain Intervention(s): Monitored during session;Repositioned     Hand Dominance Right   Extremity/Trunk Assessment Upper Extremity Assessment Upper Extremity Assessment: Generalized weakness   Lower Extremity Assessment Lower Extremity Assessment: Defer to PT evaluation   Cervical / Trunk Assessment Cervical / Trunk Assessment: Normal   Communication Communication Communication: No difficulties   Cognition Arousal/Alertness: Awake/alert Behavior During Therapy: WFL for tasks assessed/performed Overall Cognitive Status: Within Functional Limits for tasks assessed              Home Living Family/patient expects to be discharged to:: Private residence Living Arrangements: Spouse/significant other Available Help at Discharge: Family;Available PRN/intermittently (fiance works, daughter plans to come when fiance is gone) Type of Home: House Home Access: Stairs to enter Technical brewer of Steps: 2 Entrance Stairs-Rails: None Home Layout: One level     Bathroom Shower/Tub: Corporate investment banker: [place]: Cane - single  point;Walker - 2 wheels          Prior Functioning/Environment Level of Independence: Independent with assistive device(s)        Comments: using cane 1 week PTA    OT Diagnosis: Generalized weakness;Acute pain   OT Problem List: Decreased strength;Decreased activity tolerance;Decreased knowledge of use of DME or AE;Impaired balance (sitting and/or standing);Decreased safety awareness;Decreased coordination;Pain   OT Treatment/Interventions: Self-care/ADL training;Therapeutic exercise;DME and/or AE instruction;Energy conservation;Therapeutic activities;Balance training;Patient/family education    OT Goals(Current goals can be found in the care plan section) Acute Rehab OT Goals Patient Stated Goal: get stronger OT Goal Formulation: With patient Time For Goal Achievement: 01/15/15 Potential to Achieve Goals: Good ADL Goals Pt Will Perform Lower Body Bathing: with supervision;with adaptive equipment;sit to/from stand Pt Will Perform Lower Body Dressing: with supervision;with adaptive equipment;sit to/from stand Pt Will Transfer to Toilet: with modified independence;ambulating;bedside commode Pt Will Perform Tub/Shower Transfer: with supervision;rolling walker;3 in 1;ambulating  OT Frequency: Min 2X/week   Barriers to D/C: None known at this time          End of Session Equipment Utilized During Treatment: Rolling walker Nurse Communication: Patient requests pain meds  Activity Tolerance: Patient tolerated treatment well Patient left: in chair;with call bell/phone within reach   Time: 0824-0848 OT Time Calculation (min): 24 min Charges:  OT General Charges $OT Visit: 1 Procedure OT Evaluation $Initial OT Evaluation Tier I: 1 Procedure OT Treatments $Self Care/Home Management : 8-22 mins  Merlie Noga , MS, OTR/L, CLT Pager: [phone]  01/08/2015, 9:00 AM

## 2015-01-08 NOTE — Progress Notes (Signed)
Rept to Lovett Calender PA for Dr. Percell Miller. Pt BP sitting in chair manual is 92/60. Physical therapy repted that via dinamap standing pt BP 72/50. Pt was asymptomatic with physical therapy. Pt neuro check is negative but pt noted to have decreased grip of BUE. Radial pulses bilaterally moderate. Pt repts that her hands feel "floppy". Rept to PA. Orders received for IV fluid bolus and Dr. Percell Miller will come by this afternoon and see pt prior to discharge. Will continue to monitor.

## 2015-01-08 NOTE — Progress Notes (Signed)
BP after IV bolus 99/56. Lovett Calender PA and Dr. Percell Miller in to see pt. Will proceed with discharge per order.

## 2015-01-08 NOTE — Plan of Care (Signed)
Problem: Consults Goal: Diagnosis- Total Joint Replacement Primary Total Hip     

## 2015-01-08 NOTE — Progress Notes (Signed)
Physical Therapy Treatment Patient Details Name: [name] MRN: [id number] DOB: Jul 25, 1961 Today's Date: 01/08/2015    History of Present Illness adm for Lt direct anterior THA PMHx-Lt TKR, cervical fusion, wrist surgery    PT Comments    Pt denied any dizziness this session; pt with soft BP throughout session. Initially in sitting BP 89/52; standing 72/50; pt asymptomatic. Pt also c/o numbness in bill UEs and "shakiness". Pt with decr fine motor coordination in bil UEs; RN aware and calling MD. Will cont to follow per POC. Planned for family ed and stair management review this afternoon.   Follow Up Recommendations  Home health PT;Supervision/Assistance - 24 hour     Equipment Recommendations  None recommended by PT    Recommendations for Other Services       Precautions / Restrictions Precautions Precautions: None Restrictions Weight Bearing Restrictions: Yes LLE Weight Bearing: Weight bearing as tolerated    Mobility  Bed Mobility Overal bed mobility: Needs Assistance Bed Mobility: Supine to Sit     Supine to sit: Supervision     General bed mobility comments: up in chair   Transfers Overall transfer level: Needs assistance Equipment used: Rolling walker (2 wheeled) Transfers: Sit to/from Stand Sit to Stand: Supervision         General transfer comment: cues for hand placement and safety with RW; pt with difficulty moving Lt LE and requires incr time   Ambulation/Gait Ambulation/Gait assistance: Min guard Ambulation Distance (Feet): 60 Feet Assistive device: Rolling walker (2 wheeled) Gait Pattern/deviations: Step-to pattern;Decreased stance time - left;Decreased step length - right;Trunk flexed;Narrow base of support Gait velocity: decr Gait velocity interpretation: Below normal speed for age/gender General Gait Details: pt requiring incr time to advance lt LE; relying heavily on UEs; cues for technique    Stairs            Wheelchair  Mobility    Modified Rankin (Stroke Patients Only)       Balance Overall balance assessment: Needs assistance Sitting-balance support: Feet supported;No upper extremity supported Sitting balance-Leahy Scale: Good     Standing balance support: During functional activity;Bilateral upper extremity supported Standing balance-Leahy Scale: Fair Standing balance comment: stood brief period without UE support                    Cognition Arousal/Alertness: Awake/alert Behavior During Therapy: WFL for tasks assessed/performed Overall Cognitive Status: Within Functional Limits for tasks assessed                      Exercises Total Joint Exercises Ankle Circles/Pumps: AROM;Both;15 reps Quad Sets: AROM;Right;10 reps Hip ABduction/ADduction: AAROM;Left;5 reps;Seated Long Arc Quad: AROM;Left;10 reps;Seated    General Comments General comments (skin integrity, edema, etc.): pt c/o UE numbness and bil shaky hands; decr coordination in bil UEs; RN aware       Pertinent Vitals/Pain Pain Assessment: 0-10 Pain Score: 8  Pain Location: Lt hip Pain Descriptors / Indicators: Sore;Aching Pain Intervention(s): Monitored during session;Premedicated before session;Repositioned;Ice applied    Home Living Family/patient expects to be discharged to:: Private residence Living Arrangements: Spouse/significant other Available Help at Discharge: Family;Available PRN/intermittently (fiance works, daughter plans to come when fiance is gone) Type of Home: House Home Access: Stairs to enter Entrance Stairs-Rails: None Home Layout: One level Home Equipment: Kasandra Knudsen - single point;Walker - 2 wheels      Prior Function Level of Independence: Independent with assistive device(s)      Comments: using cane  1 week PTA   PT Goals (current goals can now be found in the care plan section) Acute Rehab PT Goals Patient Stated Goal: to get moving better PT Goal Formulation: With patient Time  For Goal Achievement: 01/09/15 Potential to Achieve Goals: Good Progress towards PT goals: Progressing toward goals    Frequency  7X/week    PT Plan Current plan remains appropriate    Co-evaluation             End of Session Equipment Utilized During Treatment: Gait belt Activity Tolerance: Patient tolerated treatment well Patient left: in chair;with call bell/phone within reach;with nursing/sitter in room     Time: 0916-0945 PT Time Calculation (min) (ACUTE ONLY): 29 min  Charges:  $Gait Training: 8-22 mins $Therapeutic Exercise: 8-22 mins                    G CodesGustavus Bryant, [place]  [phone] 01/08/2015, 9:56 AM

## 2015-01-08 NOTE — Progress Notes (Signed)
01/08/15 Spoke with patient about HHC. She selected [facility]. Contacted Miranda at [facility] [place]. Patient stated that that she will have family avaiable to assist after d/c. Spoke with Ruby Cola from [employer], they will be providing 3N1, patient already has a rolling walker.Will continue to follow until discharge.

## 2015-01-08 NOTE — Progress Notes (Signed)
Physical Therapy Treatment Patient Details Name: [name] MRN: [id number] DOB: 1961-05-20 Today's Date: 01/08/2015    History of Present Illness adm for Lt direct anterior THA PMHx-Lt TKR, cervical fusion, wrist surgery    PT Comments    Pt at supervision level for all mobility at this time. Pt and significant other educated on proper stair management technique. BP 99/56 during session no c/o dizziness. Pt safe from mobility standpoint to DC home today. Reviewed stroke signs and symptoms due to new fine motor dis-coordination. MD is aware and was in room during session.   Follow Up Recommendations  Home health PT;Supervision/Assistance - 24 hour     Equipment Recommendations  None recommended by PT    Recommendations for Other Services       Precautions / Restrictions Precautions Precautions: None Restrictions Weight Bearing Restrictions: Yes LLE Weight Bearing: Weight bearing as tolerated    Mobility  Bed Mobility               General bed mobility comments: up in chair   Transfers Overall transfer level: Needs assistance Equipment used: Rolling walker (2 wheeled)   Sit to Stand: Supervision         General transfer comment: min cues for hand placement   Ambulation/Gait Ambulation/Gait assistance: Supervision Ambulation Distance (Feet): 200 Feet Assistive device: Rolling walker (2 wheeled) Gait Pattern/deviations: Step-through pattern;Decreased dorsiflexion - left Gait velocity: decr Gait velocity interpretation: Below normal speed for age/gender General Gait Details: pt with difficulty initiating step with Lt LE; requires incr time but after incr ambulation is able to do so easier; min cues for safety   Stairs Stairs: Yes Stairs assistance: Min guard Stair Management: No rails;Step to pattern;Backwards;With walker Number of Stairs: 2 General stair comments: educated pt and significant other on technique; they were able to perform teachback  technique safely   Wheelchair Mobility    Modified Rankin (Stroke Patients Only)       Balance Overall balance assessment: No apparent balance deficits (not formally assessed)             Standing balance comment: pt able to perform ADLs in bathroom                    Cognition Arousal/Alertness: Awake/alert Behavior During Therapy: WFL for tasks assessed/performed Overall Cognitive Status: Within Functional Limits for tasks assessed                      Exercises Total Joint Exercises Ankle Circles/Pumps: AROM;Both;15 reps Heel Slides: AAROM;Left;10 reps;Seated Other Exercises Other Exercises: reviewed HEP handout with pt; pt able to verbalize and demo teachback for all exercises    General Comments General comments (skin integrity, edema, etc.): MD came in during session to assess decr fine motor coordination with bil UEs      Pertinent Vitals/Pain Pain Assessment: 0-10 Pain Score: 7  Pain Location: Lt hip Pain Descriptors / Indicators: Aching;Sore Pain Intervention(s): Monitored during session;Premedicated before session;Repositioned;Ice applied    Home Living                      Prior Function            PT Goals (current goals can now be found in the care plan section) Acute Rehab PT Goals Patient Stated Goal: to go home PT Goal Formulation: With patient Time For Goal Achievement: 01/09/15 Potential to Achieve Goals: Good Progress towards PT goals: Progressing toward goals  Frequency  7X/week    PT Plan Current plan remains appropriate    Co-evaluation             End of Session   Activity Tolerance: Patient tolerated treatment well Patient left: in chair;with call bell/phone within reach;with family/visitor present     Time: 8366-2947 PT Time Calculation (min) (ACUTE ONLY): 32 min  Charges:  $Gait Training: 23-37 mins                    G CodesGustavus Bryant, [place] 01/08/2015, 2:48  PM

## 2015-01-09 ENCOUNTER — Encounter (HOSPITAL_COMMUNITY): Payer: Self-pay | Admitting: Orthopedic Surgery

## 2015-01-09 DIAGNOSIS — M1990 Unspecified osteoarthritis, unspecified site: Secondary | ICD-10-CM | POA: Diagnosis not present

## 2015-01-09 DIAGNOSIS — Z96652 Presence of left artificial knee joint: Secondary | ICD-10-CM | POA: Diagnosis not present

## 2015-01-09 DIAGNOSIS — Z471 Aftercare following joint replacement surgery: Secondary | ICD-10-CM | POA: Diagnosis not present

## 2015-01-13 DIAGNOSIS — Z471 Aftercare following joint replacement surgery: Secondary | ICD-10-CM | POA: Diagnosis not present

## 2015-01-13 DIAGNOSIS — Z96652 Presence of left artificial knee joint: Secondary | ICD-10-CM | POA: Diagnosis not present

## 2015-01-13 DIAGNOSIS — M1990 Unspecified osteoarthritis, unspecified site: Secondary | ICD-10-CM | POA: Diagnosis not present

## 2015-01-15 DIAGNOSIS — M1990 Unspecified osteoarthritis, unspecified site: Secondary | ICD-10-CM | POA: Diagnosis not present

## 2015-01-15 DIAGNOSIS — Z96652 Presence of left artificial knee joint: Secondary | ICD-10-CM | POA: Diagnosis not present

## 2015-01-15 DIAGNOSIS — Z471 Aftercare following joint replacement surgery: Secondary | ICD-10-CM | POA: Diagnosis not present

## 2015-01-16 DIAGNOSIS — M25552 Pain in left hip: Secondary | ICD-10-CM | POA: Diagnosis not present

## 2015-01-16 DIAGNOSIS — M25551 Pain in right hip: Secondary | ICD-10-CM | POA: Diagnosis not present

## 2015-01-16 DIAGNOSIS — G8929 Other chronic pain: Secondary | ICD-10-CM | POA: Diagnosis not present

## 2015-01-16 DIAGNOSIS — G894 Chronic pain syndrome: Secondary | ICD-10-CM | POA: Diagnosis not present

## 2015-01-17 DIAGNOSIS — Z471 Aftercare following joint replacement surgery: Secondary | ICD-10-CM | POA: Diagnosis not present

## 2015-01-17 DIAGNOSIS — Z96652 Presence of left artificial knee joint: Secondary | ICD-10-CM | POA: Diagnosis not present

## 2015-01-17 DIAGNOSIS — M1990 Unspecified osteoarthritis, unspecified site: Secondary | ICD-10-CM | POA: Diagnosis not present

## 2015-01-20 DIAGNOSIS — Z96652 Presence of left artificial knee joint: Secondary | ICD-10-CM | POA: Diagnosis not present

## 2015-01-20 DIAGNOSIS — Z471 Aftercare following joint replacement surgery: Secondary | ICD-10-CM | POA: Diagnosis not present

## 2015-01-20 DIAGNOSIS — M1990 Unspecified osteoarthritis, unspecified site: Secondary | ICD-10-CM | POA: Diagnosis not present

## 2015-01-22 DIAGNOSIS — Z471 Aftercare following joint replacement surgery: Secondary | ICD-10-CM | POA: Diagnosis not present

## 2015-01-22 DIAGNOSIS — Z96652 Presence of left artificial knee joint: Secondary | ICD-10-CM | POA: Diagnosis not present

## 2015-01-22 DIAGNOSIS — M25552 Pain in left hip: Secondary | ICD-10-CM | POA: Diagnosis not present

## 2015-01-22 DIAGNOSIS — M1990 Unspecified osteoarthritis, unspecified site: Secondary | ICD-10-CM | POA: Diagnosis not present

## 2015-01-24 DIAGNOSIS — Z471 Aftercare following joint replacement surgery: Secondary | ICD-10-CM | POA: Diagnosis not present

## 2015-01-24 DIAGNOSIS — M1990 Unspecified osteoarthritis, unspecified site: Secondary | ICD-10-CM | POA: Diagnosis not present

## 2015-01-24 DIAGNOSIS — Z96652 Presence of left artificial knee joint: Secondary | ICD-10-CM | POA: Diagnosis not present

## 2015-01-30 DIAGNOSIS — R262 Difficulty in walking, not elsewhere classified: Secondary | ICD-10-CM | POA: Diagnosis not present

## 2015-01-30 DIAGNOSIS — M25552 Pain in left hip: Secondary | ICD-10-CM | POA: Diagnosis not present

## 2015-01-30 DIAGNOSIS — M6281 Muscle weakness (generalized): Secondary | ICD-10-CM | POA: Diagnosis not present

## 2015-01-30 DIAGNOSIS — Z96642 Presence of left artificial hip joint: Secondary | ICD-10-CM | POA: Diagnosis not present

## 2015-02-03 DIAGNOSIS — Z96642 Presence of left artificial hip joint: Secondary | ICD-10-CM | POA: Diagnosis not present

## 2015-02-03 DIAGNOSIS — M6281 Muscle weakness (generalized): Secondary | ICD-10-CM | POA: Diagnosis not present

## 2015-02-03 DIAGNOSIS — R262 Difficulty in walking, not elsewhere classified: Secondary | ICD-10-CM | POA: Diagnosis not present

## 2015-02-03 DIAGNOSIS — M25552 Pain in left hip: Secondary | ICD-10-CM | POA: Diagnosis not present

## 2015-02-06 DIAGNOSIS — M6281 Muscle weakness (generalized): Secondary | ICD-10-CM | POA: Diagnosis not present

## 2015-02-06 DIAGNOSIS — R262 Difficulty in walking, not elsewhere classified: Secondary | ICD-10-CM | POA: Diagnosis not present

## 2015-02-06 DIAGNOSIS — M25552 Pain in left hip: Secondary | ICD-10-CM | POA: Diagnosis not present

## 2015-02-06 DIAGNOSIS — Z96642 Presence of left artificial hip joint: Secondary | ICD-10-CM | POA: Diagnosis not present

## 2015-02-13 DIAGNOSIS — M25552 Pain in left hip: Secondary | ICD-10-CM | POA: Diagnosis not present

## 2015-02-13 DIAGNOSIS — Z96642 Presence of left artificial hip joint: Secondary | ICD-10-CM | POA: Diagnosis not present

## 2015-02-13 DIAGNOSIS — R262 Difficulty in walking, not elsewhere classified: Secondary | ICD-10-CM | POA: Diagnosis not present

## 2015-02-13 DIAGNOSIS — M6281 Muscle weakness (generalized): Secondary | ICD-10-CM | POA: Diagnosis not present

## 2015-02-17 DIAGNOSIS — M1611 Unilateral primary osteoarthritis, right hip: Secondary | ICD-10-CM | POA: Diagnosis not present

## 2015-02-17 DIAGNOSIS — M25551 Pain in right hip: Secondary | ICD-10-CM | POA: Diagnosis not present

## 2015-02-20 DIAGNOSIS — M25552 Pain in left hip: Secondary | ICD-10-CM | POA: Diagnosis not present

## 2015-02-20 DIAGNOSIS — Z96642 Presence of left artificial hip joint: Secondary | ICD-10-CM | POA: Diagnosis not present

## 2015-02-20 DIAGNOSIS — R262 Difficulty in walking, not elsewhere classified: Secondary | ICD-10-CM | POA: Diagnosis not present

## 2015-02-20 DIAGNOSIS — M6281 Muscle weakness (generalized): Secondary | ICD-10-CM | POA: Diagnosis not present

## 2015-02-24 DIAGNOSIS — M6281 Muscle weakness (generalized): Secondary | ICD-10-CM | POA: Diagnosis not present

## 2015-02-24 DIAGNOSIS — M25552 Pain in left hip: Secondary | ICD-10-CM | POA: Diagnosis not present

## 2015-02-24 DIAGNOSIS — Z96642 Presence of left artificial hip joint: Secondary | ICD-10-CM | POA: Diagnosis not present

## 2015-02-24 DIAGNOSIS — R262 Difficulty in walking, not elsewhere classified: Secondary | ICD-10-CM | POA: Diagnosis not present

## 2015-02-28 DIAGNOSIS — M1611 Unilateral primary osteoarthritis, right hip: Secondary | ICD-10-CM | POA: Diagnosis not present

## 2015-02-28 DIAGNOSIS — M25551 Pain in right hip: Secondary | ICD-10-CM | POA: Diagnosis not present

## 2015-02-28 NOTE — H&P (Signed)
TOTAL HIP ADMISSION H&P  Patient is admitted for right total hip arthroplasty.  Subjective:  Chief Complaint: right hip pain  HPI: [name], 54 y.o. female, has a history of pain and functional disability in the right hip(s) due to arthritis and patient has failed non-surgical conservative treatments for greater than 12 weeks to include NSAID's and/or analgesics, use of assistive devices and activity modification.  Onset of symptoms was gradual starting 1 years ago with gradually worsening course since that time.The patient noted no past surgery on the right hip(s).  Patient currently rates pain in the right hip at 7 out of 10 with activity. Patient has night pain, worsening of pain with activity and weight bearing, pain that interfers with activities of daily living, pain with passive range of motion and crepitus. Patient has evidence of joint space narrowing by imaging studies. This condition presents safety issues increasing the risk of falls.  There is no current active infection.  Patient Active Problem List   Diagnosis Date Noted  . DJD (degenerative joint disease) 01/07/2015  . Headache(784.0) 06/07/2014   Past Medical History  Diagnosis Date  . Migraine   . Osteoarthritis   . Alcoholic pancreatitis 10/4579    01/15/13    Past Surgical History  Procedure Laterality Date  . Knee surgery    . Cholecystectomy    . Joint replacement Left   . Carpal tunnel release Right   . Hand surgery Right   . Cervical fusion    . Total hip arthroplasty Left 01/07/2015    Procedure: TOTAL HIP ARTHROPLASTY ANTERIOR APPROACH;  Surgeon: Renette Butters, MD;  Location: [place];  Service: Orthopedics;  Laterality: Left;    No prescriptions prior to admission   Allergies  Allergen Reactions  . Morphine And Related Itching  . Vimovo [Naproxen-Esomeprazole] Swelling    History  Substance Use Topics  . Smoking status: Current Every Day Smoker -- 1.00 packs/day for 30 years    Types:  Cigarettes  . Smokeless tobacco: Never Used  . Alcohol Use: No    No family history on file.   Review of Systems  Constitutional: Negative for fever and chills.  HENT: Negative for sore throat.   Eyes: Negative for blurred vision and double vision.  Respiratory: Negative for cough and wheezing.   Cardiovascular: Negative for chest pain and palpitations.  Gastrointestinal: Negative for nausea and vomiting.  Musculoskeletal: Positive for joint pain.       Right hip pain with ambulation  Skin: Negative for rash.  Neurological: Negative for dizziness, seizures and headaches.  Psychiatric/Behavioral: Negative for depression and suicidal ideas.    Objective:  Physical Exam  Constitutional: She is oriented to person, place, and time. She appears well-developed and well-nourished.  HENT:  Head: Normocephalic and atraumatic.  Eyes: Conjunctivae and EOM are normal. Pupils are equal, round, and reactive to light.  Neck: Normal range of motion. Neck supple.  Cardiovascular: Normal rate and intact distal pulses.   Respiratory: Effort normal and breath sounds normal.  GI: Soft. Bowel sounds are normal.  Musculoskeletal: She exhibits tenderness (rigth hip greater troch.).  Decreased ROM by 50% due to pain  Neurological: She is alert and oriented to person, place, and time.  Skin: Skin is warm and dry.  Psychiatric: She has a normal mood and affect. Her behavior is normal. Judgment and thought content normal.    Vital signs in last 24 hours:    Labs:   Estimated body mass index is  26.05 kg/(m^2) as calculated from the following:   Height as of 01/07/15: 5\' 6"  (1.676 m).   Weight as of 01/01/15: 73.165 kg (161 lb 4.8 oz).   Imaging Review Plain radiographs demonstrate severe degenerative joint disease of the right hip(s). The bone quality appears to be fair for age and reported activity level.  Assessment/Plan:  End stage arthritis, right hip(s)  The patient history, physical  examination, clinical judgement of the provider and imaging studies are consistent with end stage degenerative joint disease of the right hip(s) and total hip arthroplasty is deemed medically necessary. The treatment options including medical management, injection therapy, arthroscopy and arthroplasty were discussed at length. The risks and benefits of total hip arthroplasty were presented and reviewed. The risks due to aseptic loosening, infection, stiffness, dislocation/subluxation,  thromboembolic complications and other imponderables were discussed.  The patient acknowledged the explanation, agreed to proceed with the plan and consent was signed. Patient is being admitted for inpatient treatment for surgery, pain control, PT, OT, prophylactic antibiotics, VTE prophylaxis, progressive ambulation and ADL's and discharge planning.The patient is planning to be discharged home with home health services

## 2015-03-05 DIAGNOSIS — Z96652 Presence of left artificial knee joint: Secondary | ICD-10-CM | POA: Diagnosis not present

## 2015-03-13 ENCOUNTER — Encounter (HOSPITAL_COMMUNITY)
Admission: RE | Admit: 2015-03-13 | Discharge: 2015-03-13 | Disposition: A | Discharge status: Home / Self Care | Payer: Commercial Managed Care - HMO | Source: Ambulatory Visit | Attending: Orthopedic Surgery | Admitting: Orthopedic Surgery

## 2015-03-13 ENCOUNTER — Encounter (HOSPITAL_COMMUNITY): Payer: Self-pay

## 2015-03-13 DIAGNOSIS — Z01812 Encounter for preprocedural laboratory examination: Secondary | ICD-10-CM | POA: Insufficient documentation

## 2015-03-13 DIAGNOSIS — G8929 Other chronic pain: Secondary | ICD-10-CM | POA: Diagnosis not present

## 2015-03-13 DIAGNOSIS — Z5181 Encounter for therapeutic drug level monitoring: Secondary | ICD-10-CM | POA: Diagnosis not present

## 2015-03-13 DIAGNOSIS — M25551 Pain in right hip: Secondary | ICD-10-CM | POA: Diagnosis not present

## 2015-03-13 DIAGNOSIS — F17210 Nicotine dependence, cigarettes, uncomplicated: Secondary | ICD-10-CM | POA: Diagnosis not present

## 2015-03-13 DIAGNOSIS — M1611 Unilateral primary osteoarthritis, right hip: Secondary | ICD-10-CM | POA: Diagnosis not present

## 2015-03-13 DIAGNOSIS — G894 Chronic pain syndrome: Secondary | ICD-10-CM | POA: Diagnosis not present

## 2015-03-13 DIAGNOSIS — Z0183 Encounter for blood typing: Secondary | ICD-10-CM | POA: Diagnosis not present

## 2015-03-13 DIAGNOSIS — M25552 Pain in left hip: Secondary | ICD-10-CM | POA: Diagnosis not present

## 2015-03-13 DIAGNOSIS — Z79899 Other long term (current) drug therapy: Secondary | ICD-10-CM | POA: Diagnosis not present

## 2015-03-13 HISTORY — DX: Anxiety disorder, unspecified: F41.9

## 2015-03-13 HISTORY — DX: Major depressive disorder, single episode, unspecified: F32.9

## 2015-03-13 HISTORY — DX: Fibromyalgia: M79.7

## 2015-03-13 HISTORY — DX: Anemia, unspecified: D64.9

## 2015-03-13 HISTORY — DX: Other chronic pain: G89.29

## 2015-03-13 HISTORY — DX: Depression, unspecified: F32.A

## 2015-03-13 LAB — COMPREHENSIVE METABOLIC PANEL
ALK PHOS: 89 U/L (ref 39–117)
ALT: 20 U/L (ref 0–35)
AST: 30 U/L (ref 0–37)
Albumin: 4.2 g/dL (ref 3.5–5.2)
Anion gap: 9 (ref 5–15)
BUN: 9 mg/dL (ref 6–23)
CHLORIDE: 98 mmol/L (ref 96–112)
CO2: 30 mmol/L (ref 19–32)
Calcium: 10 mg/dL (ref 8.4–10.5)
Creatinine, Ser: 0.75 mg/dL (ref 0.50–1.10)
GLUCOSE: 93 mg/dL (ref 70–99)
POTASSIUM: 3.6 mmol/L (ref 3.5–5.1)
Sodium: 137 mmol/L (ref 135–145)
Total Bilirubin: 0.7 mg/dL (ref 0.3–1.2)
Total Protein: 7 g/dL (ref 6.0–8.3)

## 2015-03-13 LAB — SURGICAL PCR SCREEN
MRSA, PCR: NEGATIVE
Staphylococcus aureus: NEGATIVE

## 2015-03-13 LAB — PROTIME-INR
INR: 1.01 (ref 0.00–1.49)
PROTHROMBIN TIME: 13.4 s (ref 11.6–15.2)

## 2015-03-13 LAB — URINALYSIS, ROUTINE W REFLEX MICROSCOPIC
BILIRUBIN URINE: NEGATIVE
GLUCOSE, UA: NEGATIVE mg/dL
Hgb urine dipstick: NEGATIVE
Ketones, ur: NEGATIVE mg/dL
NITRITE: NEGATIVE
PH: 6 (ref 5.0–8.0)
Protein, ur: NEGATIVE mg/dL
SPECIFIC GRAVITY, URINE: 1.012 (ref 1.005–1.030)
Urobilinogen, UA: 0.2 mg/dL (ref 0.0–1.0)

## 2015-03-13 LAB — CBC
HCT: 36.4 % (ref 36.0–46.0)
Hemoglobin: 12 g/dL (ref 12.0–15.0)
MCH: 28.9 pg (ref 26.0–34.0)
MCHC: 33 g/dL (ref 30.0–36.0)
MCV: 87.7 fL (ref 78.0–100.0)
PLATELETS: 141 10*3/uL — AB (ref 150–400)
RBC: 4.15 MIL/uL (ref 3.87–5.11)
RDW: 13 % (ref 11.5–15.5)
WBC: 6.7 10*3/uL (ref 4.0–10.5)

## 2015-03-13 LAB — URINE MICROSCOPIC-ADD ON

## 2015-03-13 LAB — TYPE AND SCREEN
ABO/RH(D): O NEG
Antibody Screen: NEGATIVE

## 2015-03-13 NOTE — Pre-Procedure Instructions (Signed)
[name]  03/13/2015   Your procedure is scheduled on:  May 3  Report to [facility] Admitting at 08:00 AM.  Call this number if you have problems the morning of surgery: [phone]   Remember:   Do not eat food or drink liquids after midnight.   Take these medicines the morning of surgery with A SIP OF WATER: Baclofen, Wellbutrin, Fentanyl patch, Gabapentin, Hydromorphone (if needed), Meclizine (if needed), Ondansetron (if needed), Imitex (if needed)   STOP Fish Oil, Multiple Vitamins, Meloxicam, B12, Calcium, Aspirin April 26   STOP/ Do not take Aspirin, Aleve, Naproxen, Advil, Ibuprofen, Motrin, Vitamins, Herbs, or Supplements starting April 26   Do not wear jewelry, make-up or nail polish.  Do not wear lotions, powders, or perfumes. You may wear deodorant.  Do not shave 48 hours prior to surgery. Men may shave face and neck.  Do not bring valuables to the hospital.  [facility] is not responsible for any belongings or valuables.               Contacts, dentures or bridgework may not be worn into surgery.  Leave suitcase in the car. After surgery it may be brought to your room.  For patients admitted to the hospital, discharge time is determined by your treatment team.               Special Instructions: [place] - Preparing for Surgery  Before surgery, you can play an important role.  Because skin is not sterile, your skin needs to be as free of germs as possible.  You can reduce the number of germs on you skin by washing with CHG (chlorahexidine gluconate) soap before surgery.  CHG is an antiseptic cleaner which kills germs and bonds with the skin to continue killing germs even after washing.  Please DO NOT use if you have an allergy to CHG or antibacterial soaps.  If your skin becomes reddened/irritated stop using the CHG and inform your nurse when you arrive at Short Stay.  Do not shave (including legs and underarms) for at least 48 hours prior to the  first CHG shower.  You may shave your face.  Please follow these instructions carefully:   1.  Shower with CHG Soap the night before surgery and the morning of Surgery.  2.  If you choose to wash your hair, wash your hair first as usual with your normal shampoo.  3.  After you shampoo, rinse your hair and body thoroughly to remove the shampoo.  4.  Use CHG as you would any other liquid soap.  You can apply CHG directly to the skin and wash gently with scrungie or a clean washcloth.  5.  Apply the CHG Soap to your body ONLY FROM THE NECK DOWN.  Do not use on open wounds or open sores.  Avoid contact with your eyes, ears, mouth and genitals (private parts).  Wash genitals (private parts) with your normal soap.  6.  Wash thoroughly, paying special attention to the area where your surgery will be performed.  7.  Thoroughly rinse your body with warm water from the neck down.  8.  DO NOT shower/wash with your normal soap after using and rinsing off the CHG Soap.  9.  Pat yourself dry with a clean towel.            10.  Wear clean pajamas.            11.  Place  clean sheets on your bed the night of your first shower and do not sleep with pets.  Day of Surgery  Do not apply any lotions the morning of surgery.  Please wear clean clothes to the hospital/surgery center.     Please read over the following fact sheets that you were given: Pain Booklet, Coughing and Deep Breathing, Blood Transfusion Information and Surgical Site Infection Prevention

## 2015-03-13 NOTE — Progress Notes (Signed)
Spoke with [name] at Dr. Debroah Loop office to make MD aware that UA was abnormal and urine culture is still pending.

## 2015-03-13 NOTE — Progress Notes (Signed)
PCP Corine Shelter with [facility]. Denies CP, Shob, cardiology visit, or cardiac test.

## 2015-03-14 LAB — URINE CULTURE: Colony Count: 90000

## 2015-03-18 DIAGNOSIS — E785 Hyperlipidemia, unspecified: Secondary | ICD-10-CM | POA: Diagnosis not present

## 2015-03-18 DIAGNOSIS — Z79899 Other long term (current) drug therapy: Secondary | ICD-10-CM | POA: Diagnosis not present

## 2015-03-18 DIAGNOSIS — G894 Chronic pain syndrome: Secondary | ICD-10-CM | POA: Diagnosis not present

## 2015-03-18 DIAGNOSIS — Z0001 Encounter for general adult medical examination with abnormal findings: Secondary | ICD-10-CM | POA: Diagnosis not present

## 2015-03-18 DIAGNOSIS — F329 Major depressive disorder, single episode, unspecified: Secondary | ICD-10-CM | POA: Diagnosis not present

## 2015-03-24 MED ORDER — CHLORHEXIDINE GLUCONATE 4 % EX LIQD
60.0000 mL | Freq: Once | CUTANEOUS | Status: DC
  Filled 2015-03-24: qty 60

## 2015-03-24 MED ORDER — POTASSIUM CHLORIDE IN NACL 20-0.45 MEQ/L-% IV SOLN
INTRAVENOUS | Status: DC
  Filled 2015-03-24 (×2): qty 1000

## 2015-03-24 MED ORDER — CEFAZOLIN SODIUM-DEXTROSE 2-3 GM-% IV SOLR
2.0000 g | INTRAVENOUS | Status: AC
  Administered 2015-03-25: 2 g via INTRAVENOUS
  Filled 2015-03-24: qty 50

## 2015-03-24 MED ORDER — ACETAMINOPHEN 500 MG PO TABS
1000.0000 mg | ORAL_TABLET | Freq: Once | ORAL | Status: AC
  Administered 2015-03-25: 1000 mg via ORAL
  Filled 2015-03-24: qty 2

## 2015-03-24 MED ORDER — TRANEXAMIC ACID 1000 MG/10ML IV SOLN
1000.0000 mg | INTRAVENOUS | Status: AC
  Administered 2015-03-25 (×2): 1000 mg via INTRAVENOUS
  Filled 2015-03-24: qty 10

## 2015-03-25 ENCOUNTER — Encounter (HOSPITAL_COMMUNITY)
Admission: RE | Disposition: A | Discharge status: Home / Self Care | Payer: Self-pay | Source: Ambulatory Visit | Attending: Orthopedic Surgery

## 2015-03-25 ENCOUNTER — Inpatient Hospital Stay (HOSPITAL_COMMUNITY)
Admission: RE | Admit: 2015-03-25 | Discharge: 2015-03-26 | DRG: 470 | Disposition: A | Discharge status: Home / Self Care | Payer: Commercial Managed Care - HMO | Source: Ambulatory Visit | Attending: Orthopedic Surgery | Admitting: Orthopedic Surgery

## 2015-03-25 ENCOUNTER — Inpatient Hospital Stay (HOSPITAL_COMMUNITY): Payer: Commercial Managed Care - HMO | Admitting: Certified Registered Nurse Anesthetist

## 2015-03-25 ENCOUNTER — Inpatient Hospital Stay (HOSPITAL_COMMUNITY): Payer: Commercial Managed Care - HMO

## 2015-03-25 ENCOUNTER — Encounter (HOSPITAL_COMMUNITY): Payer: Self-pay | Admitting: *Deleted

## 2015-03-25 DIAGNOSIS — Z96649 Presence of unspecified artificial hip joint: Secondary | ICD-10-CM

## 2015-03-25 DIAGNOSIS — M545 Low back pain: Secondary | ICD-10-CM | POA: Diagnosis present

## 2015-03-25 DIAGNOSIS — M1990 Unspecified osteoarthritis, unspecified site: Secondary | ICD-10-CM | POA: Diagnosis present

## 2015-03-25 DIAGNOSIS — D649 Anemia, unspecified: Secondary | ICD-10-CM | POA: Diagnosis not present

## 2015-03-25 DIAGNOSIS — Z96642 Presence of left artificial hip joint: Secondary | ICD-10-CM | POA: Diagnosis not present

## 2015-03-25 DIAGNOSIS — F17210 Nicotine dependence, cigarettes, uncomplicated: Secondary | ICD-10-CM | POA: Diagnosis present

## 2015-03-25 DIAGNOSIS — M25551 Pain in right hip: Secondary | ICD-10-CM | POA: Diagnosis present

## 2015-03-25 DIAGNOSIS — F419 Anxiety disorder, unspecified: Secondary | ICD-10-CM | POA: Diagnosis not present

## 2015-03-25 DIAGNOSIS — M1611 Unilateral primary osteoarthritis, right hip: Secondary | ICD-10-CM | POA: Diagnosis not present

## 2015-03-25 DIAGNOSIS — M797 Fibromyalgia: Secondary | ICD-10-CM | POA: Diagnosis present

## 2015-03-25 DIAGNOSIS — G8929 Other chronic pain: Secondary | ICD-10-CM | POA: Diagnosis present

## 2015-03-25 DIAGNOSIS — Z471 Aftercare following joint replacement surgery: Secondary | ICD-10-CM | POA: Diagnosis not present

## 2015-03-25 DIAGNOSIS — F329 Major depressive disorder, single episode, unspecified: Secondary | ICD-10-CM | POA: Diagnosis not present

## 2015-03-25 DIAGNOSIS — Z96641 Presence of right artificial hip joint: Secondary | ICD-10-CM | POA: Diagnosis not present

## 2015-03-25 DIAGNOSIS — M169 Osteoarthritis of hip, unspecified: Secondary | ICD-10-CM | POA: Diagnosis not present

## 2015-03-25 HISTORY — DX: Low back pain: M54.5

## 2015-03-25 HISTORY — DX: Other chronic pain: G89.29

## 2015-03-25 HISTORY — DX: Personal history of other medical treatment: Z92.89

## 2015-03-25 HISTORY — DX: Low back pain, unspecified: M54.50

## 2015-03-25 HISTORY — PX: TOTAL HIP ARTHROPLASTY: SHX124

## 2015-03-25 SURGERY — ARTHROPLASTY, HIP, TOTAL, ANTERIOR APPROACH
Anesthesia: General | Site: Hip | Laterality: Right

## 2015-03-25 MED ORDER — PHENOL 1.4 % MT LIQD: 1.0000 | OROMUCOSAL | Status: DC | PRN

## 2015-03-25 MED ORDER — BUPIVACAINE LIPOSOME 1.3 % IJ SUSP
INTRAMUSCULAR | Status: DC | PRN
  Administered 2015-03-25: 20 mL

## 2015-03-25 MED ORDER — FENTANYL CITRATE (PF) 100 MCG/2ML IJ SOLN
INTRAMUSCULAR | Status: AC
  Administered 2015-03-25: 50 ug via INTRAVENOUS
  Filled 2015-03-25: qty 2

## 2015-03-25 MED ORDER — MIDAZOLAM HCL 5 MG/5ML IJ SOLN
INTRAMUSCULAR | Status: DC | PRN
  Administered 2015-03-25: 2 mg via INTRAVENOUS

## 2015-03-25 MED ORDER — OXYCODONE HCL 5 MG PO TABS
ORAL_TABLET | ORAL | Status: AC
  Filled 2015-03-25: qty 1

## 2015-03-25 MED ORDER — DEXTROSE-NACL 5-0.45 % IV SOLN
INTRAVENOUS | Status: DC
  Administered 2015-03-25: 21:00:00 via INTRAVENOUS

## 2015-03-25 MED ORDER — OXYCODONE-ACETAMINOPHEN 5-325 MG PO TABS: 1.0000 | ORAL_TABLET | ORAL | Status: DC | PRN

## 2015-03-25 MED ORDER — ONDANSETRON HCL 4 MG/2ML IJ SOLN
4.0000 mg | Freq: Four times a day (QID) | INTRAMUSCULAR | Status: DC | PRN
  Administered 2015-03-25: 4 mg via INTRAVENOUS
  Filled 2015-03-25: qty 2

## 2015-03-25 MED ORDER — GLYCOPYRROLATE 0.2 MG/ML IJ SOLN
INTRAMUSCULAR | Status: DC | PRN
  Administered 2015-03-25: 0.6 mg via INTRAVENOUS

## 2015-03-25 MED ORDER — MIDAZOLAM HCL 2 MG/2ML IJ SOLN
INTRAMUSCULAR | Status: AC
  Administered 2015-03-25: 2 mg via INTRAVENOUS
  Filled 2015-03-25: qty 2

## 2015-03-25 MED ORDER — FENTANYL CITRATE (PF) 100 MCG/2ML IJ SOLN
INTRAMUSCULAR | Status: DC | PRN
  Administered 2015-03-25 (×2): 100 ug via INTRAVENOUS
  Administered 2015-03-25: 50 ug via INTRAVENOUS

## 2015-03-25 MED ORDER — HYDROMORPHONE HCL 1 MG/ML IJ SOLN
1.0000 mg | INTRAMUSCULAR | Status: DC | PRN
  Administered 2015-03-25 – 2015-03-26 (×4): 1 mg via INTRAVENOUS
  Filled 2015-03-25 (×4): qty 1

## 2015-03-25 MED ORDER — EPHEDRINE SULFATE 50 MG/ML IJ SOLN
INTRAMUSCULAR | Status: AC
  Filled 2015-03-25: qty 1

## 2015-03-25 MED ORDER — MECLIZINE HCL 25 MG PO TABS
25.0000 mg | ORAL_TABLET | Freq: Three times a day (TID) | ORAL | Status: DC | PRN
  Filled 2015-03-25: qty 1

## 2015-03-25 MED ORDER — ROCURONIUM BROMIDE 50 MG/5ML IV SOLN
INTRAVENOUS | Status: AC
  Filled 2015-03-25: qty 1

## 2015-03-25 MED ORDER — MELOXICAM 15 MG PO TABS: 15.0000 mg | ORAL_TABLET | Freq: Every day | ORAL | Status: DC

## 2015-03-25 MED ORDER — METHOCARBAMOL 500 MG PO TABS
ORAL_TABLET | ORAL | Status: AC
  Filled 2015-03-25: qty 1

## 2015-03-25 MED ORDER — MENTHOL 3 MG MT LOZG: 1.0000 | LOZENGE | OROMUCOSAL | Status: DC | PRN

## 2015-03-25 MED ORDER — EPHEDRINE SULFATE 50 MG/ML IJ SOLN
INTRAMUSCULAR | Status: DC | PRN
  Administered 2015-03-25: 10 mg via INTRAVENOUS
  Administered 2015-03-25 (×2): 5 mg via INTRAVENOUS

## 2015-03-25 MED ORDER — DEXAMETHASONE SODIUM PHOSPHATE 10 MG/ML IJ SOLN
10.0000 mg | Freq: Once | INTRAMUSCULAR | Status: AC
  Administered 2015-03-26: 10 mg via INTRAVENOUS
  Filled 2015-03-25: qty 1

## 2015-03-25 MED ORDER — FENTANYL CITRATE (PF) 100 MCG/2ML IJ SOLN
25.0000 ug | INTRAMUSCULAR | Status: DC | PRN
  Administered 2015-03-25 (×3): 50 ug via INTRAVENOUS

## 2015-03-25 MED ORDER — ACETAMINOPHEN 650 MG RE SUPP: 650.0000 mg | Freq: Four times a day (QID) | RECTAL | Status: DC | PRN

## 2015-03-25 MED ORDER — HYDROMORPHONE HCL 1 MG/ML IJ SOLN
INTRAMUSCULAR | Status: AC
  Filled 2015-03-25: qty 1

## 2015-03-25 MED ORDER — GABAPENTIN 300 MG PO CAPS: 600.0000 mg | ORAL_CAPSULE | Freq: Three times a day (TID) | ORAL | Status: DC

## 2015-03-25 MED ORDER — MIDAZOLAM HCL 2 MG/2ML IJ SOLN
2.0000 mg | Freq: Once | INTRAMUSCULAR | Status: AC
  Administered 2015-03-25: 2 mg via INTRAVENOUS

## 2015-03-25 MED ORDER — HYDROMORPHONE HCL 1 MG/ML IJ SOLN
INTRAMUSCULAR | Status: DC | PRN
  Administered 2015-03-25 (×2): 0.5 mg via INTRAVENOUS
  Administered 2015-03-25: 1 mg via INTRAVENOUS
  Administered 2015-03-25 (×2): 0.5 mg via INTRAVENOUS
  Administered 2015-03-25: 1 mg via INTRAVENOUS

## 2015-03-25 MED ORDER — DEXAMETHASONE SODIUM PHOSPHATE 10 MG/ML IJ SOLN
INTRAMUSCULAR | Status: DC | PRN
  Administered 2015-03-25: 4 mg via INTRAVENOUS

## 2015-03-25 MED ORDER — ONDANSETRON HCL 4 MG PO TABS
4.0000 mg | ORAL_TABLET | Freq: Four times a day (QID) | ORAL | Status: DC | PRN
  Administered 2015-03-26: 4 mg via ORAL
  Filled 2015-03-25: qty 1

## 2015-03-25 MED ORDER — ACETAMINOPHEN 325 MG PO TABS: 650.0000 mg | ORAL_TABLET | Freq: Four times a day (QID) | ORAL | Status: DC | PRN

## 2015-03-25 MED ORDER — SUMATRIPTAN SUCCINATE 100 MG PO TABS
100.0000 mg | ORAL_TABLET | ORAL | Status: DC | PRN
  Filled 2015-03-25: qty 1

## 2015-03-25 MED ORDER — ASPIRIN 325 MG PO TABS: 325.0000 mg | ORAL_TABLET | Freq: Every day | ORAL | Status: DC

## 2015-03-25 MED ORDER — NEOSTIGMINE METHYLSULFATE 10 MG/10ML IV SOLN
INTRAVENOUS | Status: AC
  Filled 2015-03-25: qty 1

## 2015-03-25 MED ORDER — MIDAZOLAM HCL 2 MG/2ML IJ SOLN
INTRAMUSCULAR | Status: AC
  Filled 2015-03-25: qty 2

## 2015-03-25 MED ORDER — DEXTROSE 5 % IV SOLN
500.0000 mg | Freq: Four times a day (QID) | INTRAVENOUS | Status: DC | PRN
  Filled 2015-03-25: qty 5

## 2015-03-25 MED ORDER — GLYCOPYRROLATE 0.2 MG/ML IJ SOLN
INTRAMUSCULAR | Status: AC
  Filled 2015-03-25: qty 3

## 2015-03-25 MED ORDER — OXYCODONE HCL 5 MG PO TABS
5.0000 mg | ORAL_TABLET | ORAL | Status: DC | PRN
  Administered 2015-03-25 – 2015-03-26 (×6): 10 mg via ORAL
  Filled 2015-03-25 (×5): qty 2

## 2015-03-25 MED ORDER — AMITRIPTYLINE HCL 10 MG PO TABS
15.0000 mg | ORAL_TABLET | Freq: Every day | ORAL | Status: DC
  Administered 2015-03-25: 15 mg via ORAL
  Filled 2015-03-25 (×2): qty 1.5

## 2015-03-25 MED ORDER — PROPOFOL 10 MG/ML IV BOLUS
INTRAVENOUS | Status: AC
  Filled 2015-03-25: qty 20

## 2015-03-25 MED ORDER — METOCLOPRAMIDE HCL 5 MG/ML IJ SOLN: 5.0000 mg | Freq: Three times a day (TID) | INTRAMUSCULAR | Status: DC | PRN

## 2015-03-25 MED ORDER — ONDANSETRON HCL 4 MG/2ML IJ SOLN: 4.0000 mg | Freq: Once | INTRAMUSCULAR | Status: DC | PRN

## 2015-03-25 MED ORDER — PROPOFOL 10 MG/ML IV BOLUS
INTRAVENOUS | Status: DC | PRN
  Administered 2015-03-25: 150 mg via INTRAVENOUS

## 2015-03-25 MED ORDER — FENTANYL 25 MCG/HR TD PT72: 50.0000 ug | MEDICATED_PATCH | TRANSDERMAL | Status: DC

## 2015-03-25 MED ORDER — GABAPENTIN 600 MG PO TABS
600.0000 mg | ORAL_TABLET | Freq: Two times a day (BID) | ORAL | Status: DC
  Administered 2015-03-26: 600 mg via ORAL
  Filled 2015-03-25 (×3): qty 1

## 2015-03-25 MED ORDER — OXYCODONE HCL 5 MG PO TABS: 5.0000 mg | ORAL_TABLET | ORAL | Status: DC | PRN

## 2015-03-25 MED ORDER — LIDOCAINE HCL (CARDIAC) 20 MG/ML IV SOLN
INTRAVENOUS | Status: AC
  Filled 2015-03-25: qty 5

## 2015-03-25 MED ORDER — DOCUSATE SODIUM 100 MG PO CAPS: 100.0000 mg | ORAL_CAPSULE | Freq: Two times a day (BID) | ORAL | Status: DC

## 2015-03-25 MED ORDER — ONDANSETRON HCL 4 MG/2ML IJ SOLN
INTRAMUSCULAR | Status: DC | PRN
Start: 2015-03-25 — End: 2015-03-25
  Administered 2015-03-25: 4 mg via INTRAVENOUS

## 2015-03-25 MED ORDER — BUPIVACAINE LIPOSOME 1.3 % IJ SUSP
20.0000 mL | Freq: Once | INTRAMUSCULAR | Status: DC
  Filled 2015-03-25: qty 20

## 2015-03-25 MED ORDER — CEFAZOLIN SODIUM-DEXTROSE 2-3 GM-% IV SOLR
2.0000 g | Freq: Four times a day (QID) | INTRAVENOUS | Status: AC
  Administered 2015-03-25 (×2): 2 g via INTRAVENOUS
  Filled 2015-03-25 (×2): qty 50

## 2015-03-25 MED ORDER — LACTATED RINGERS IV SOLN
INTRAVENOUS | Status: DC
Start: 2015-03-25 — End: 2015-03-25
  Administered 2015-03-25 (×3): via INTRAVENOUS

## 2015-03-25 MED ORDER — TRANEXAMIC ACID 1000 MG/10ML IV SOLN
1000.0000 mg | INTRAVENOUS | Status: DC
  Filled 2015-03-25 (×2): qty 10

## 2015-03-25 MED ORDER — FENTANYL CITRATE (PF) 250 MCG/5ML IJ SOLN
INTRAMUSCULAR | Status: AC
  Filled 2015-03-25: qty 5

## 2015-03-25 MED ORDER — BUPROPION HCL ER (XL) 300 MG PO TB24
300.0000 mg | ORAL_TABLET | Freq: Every day | ORAL | Status: DC
  Administered 2015-03-26: 300 mg via ORAL
  Filled 2015-03-25: qty 1

## 2015-03-25 MED ORDER — LIDOCAINE HCL (CARDIAC) 20 MG/ML IV SOLN
INTRAVENOUS | Status: DC | PRN
  Administered 2015-03-25: 40 mg via INTRAVENOUS

## 2015-03-25 MED ORDER — METOCLOPRAMIDE HCL 5 MG PO TABS: 5.0000 mg | ORAL_TABLET | Freq: Three times a day (TID) | ORAL | Status: DC | PRN

## 2015-03-25 MED ORDER — ROCURONIUM BROMIDE 100 MG/10ML IV SOLN
INTRAVENOUS | Status: DC | PRN
  Administered 2015-03-25: 10 mg via INTRAVENOUS
  Administered 2015-03-25: 50 mg via INTRAVENOUS

## 2015-03-25 MED ORDER — METHOCARBAMOL 500 MG PO TABS
500.0000 mg | ORAL_TABLET | Freq: Four times a day (QID) | ORAL | Status: DC | PRN
  Administered 2015-03-25 – 2015-03-26 (×3): 500 mg via ORAL
  Filled 2015-03-25 (×2): qty 1

## 2015-03-25 MED ORDER — ONDANSETRON HCL 4 MG PO TABS: 4.0000 mg | ORAL_TABLET | Freq: Three times a day (TID) | ORAL | Status: DC | PRN

## 2015-03-25 MED ORDER — ASPIRIN EC 325 MG PO TBEC
325.0000 mg | DELAYED_RELEASE_TABLET | Freq: Every day | ORAL | Status: DC
  Administered 2015-03-26: 325 mg via ORAL
  Filled 2015-03-25: qty 1

## 2015-03-25 MED ORDER — GABAPENTIN 600 MG PO TABS
1200.0000 mg | ORAL_TABLET | Freq: Every day | ORAL | Status: DC
Start: 2015-03-25 — End: 2015-03-26
  Administered 2015-03-25: 1200 mg via ORAL
  Filled 2015-03-25 (×2): qty 2

## 2015-03-25 MED ORDER — ONDANSETRON HCL 4 MG/2ML IJ SOLN
INTRAMUSCULAR | Status: AC
  Filled 2015-03-25: qty 2

## 2015-03-25 MED ORDER — CELECOXIB 200 MG PO CAPS
200.0000 mg | ORAL_CAPSULE | Freq: Two times a day (BID) | ORAL | Status: DC
  Administered 2015-03-25 – 2015-03-26 (×2): 200 mg via ORAL
  Filled 2015-03-25 (×2): qty 1

## 2015-03-25 MED ORDER — DOCUSATE SODIUM 100 MG PO CAPS
100.0000 mg | ORAL_CAPSULE | Freq: Two times a day (BID) | ORAL | Status: DC
  Administered 2015-03-25 – 2015-03-26 (×2): 100 mg via ORAL
  Filled 2015-03-25 (×2): qty 1

## 2015-03-25 MED ORDER — NEOSTIGMINE METHYLSULFATE 10 MG/10ML IV SOLN
INTRAVENOUS | Status: DC | PRN
  Administered 2015-03-25: 5 mg via INTRAVENOUS

## 2015-03-25 MED ORDER — SODIUM CHLORIDE 0.9 % IJ SOLN
INTRAMUSCULAR | Status: AC
  Filled 2015-03-25: qty 10

## 2015-03-25 MED ORDER — 0.9 % SODIUM CHLORIDE (POUR BTL) OPTIME
TOPICAL | Status: DC | PRN
  Administered 2015-03-25: 1000 mL

## 2015-03-25 SURGICAL SUPPLY — 58 items
ADH SKN CLS APL DERMABOND .7 (GAUZE/BANDAGES/DRESSINGS) ×1
BAG DECANTER FOR FLEXI CONT (MISCELLANEOUS) IMPLANT
BLADE SAW SGTL 18X1.27X75 (BLADE) ×2 IMPLANT
BLADE SAW SGTL 18X1.27X75MM (BLADE) ×1
BLADE SURG ROTATE 9660 (MISCELLANEOUS) ×2 IMPLANT
CAPT HIP TOTAL 2 ×2 IMPLANT
COVER SURGICAL LIGHT HANDLE (MISCELLANEOUS) ×3 IMPLANT
DERMABOND ADVANCED (GAUZE/BANDAGES/DRESSINGS) ×2
DERMABOND ADVANCED .7 DNX12 (GAUZE/BANDAGES/DRESSINGS) IMPLANT
DRAPE C-ARM 42X72 X-RAY (DRAPES) IMPLANT
DRAPE IMP U-DRAPE 54X76 (DRAPES) ×3 IMPLANT
DRAPE INCISE IOBAN 66X45 STRL (DRAPES) ×3 IMPLANT
DRAPE ORTHO SPLIT 77X108 STRL (DRAPES) ×6
DRAPE SURG ORHT 6 SPLT 77X108 (DRAPES) ×2 IMPLANT
DRAPE U-SHAPE 47X51 STRL (DRAPES) ×3 IMPLANT
DRSG MEPILEX BORDER 4X8 (GAUZE/BANDAGES/DRESSINGS) ×3 IMPLANT
DURAPREP 26ML APPLICATOR (WOUND CARE) ×3 IMPLANT
ELECT BLADE 4.0 EZ CLEAN MEGAD (MISCELLANEOUS) ×3
ELECT REM PT RETURN 9FT ADLT (ELECTROSURGICAL) ×3
ELECTRODE BLDE 4.0 EZ CLN MEGD (MISCELLANEOUS) ×1 IMPLANT
ELECTRODE REM PT RTRN 9FT ADLT (ELECTROSURGICAL) ×1 IMPLANT
FACESHIELD WRAPAROUND (MASK) ×6 IMPLANT
FACESHIELD WRAPAROUND OR TEAM (MASK) ×2 IMPLANT
GLOVE BIO SURGEON STRL SZ7 (GLOVE) ×3 IMPLANT
GLOVE BIO SURGEON STRL SZ7.5 (GLOVE) ×3 IMPLANT
GLOVE BIOGEL PI IND STRL 7.0 (GLOVE) ×1 IMPLANT
GLOVE BIOGEL PI IND STRL 8 (GLOVE) ×1 IMPLANT
GLOVE BIOGEL PI INDICATOR 7.0 (GLOVE) ×2
GLOVE BIOGEL PI INDICATOR 8 (GLOVE) ×2
GOWN STRL REUS W/ TWL LRG LVL3 (GOWN DISPOSABLE) ×2 IMPLANT
GOWN STRL REUS W/ TWL XL LVL3 (GOWN DISPOSABLE) ×1 IMPLANT
GOWN STRL REUS W/TWL LRG LVL3 (GOWN DISPOSABLE) ×6
GOWN STRL REUS W/TWL XL LVL3 (GOWN DISPOSABLE) ×3
KIT BASIN OR (CUSTOM PROCEDURE TRAY) ×3 IMPLANT
KIT ROOM TURNOVER OR (KITS) ×3 IMPLANT
MANIFOLD NEPTUNE II (INSTRUMENTS) ×3 IMPLANT
NDL 18GX1X1/2 (RX/OR ONLY) (NEEDLE) ×1 IMPLANT
NDL SAFETY ECLIPSE 18X1.5 (NEEDLE) ×1 IMPLANT
NEEDLE 18GX1X1/2 (RX/OR ONLY) (NEEDLE) ×3 IMPLANT
NEEDLE HYPO 18GX1.5 SHARP (NEEDLE) ×3
NS IRRIG 1000ML POUR BTL (IV SOLUTION) ×3 IMPLANT
PACK TOTAL JOINT (CUSTOM PROCEDURE TRAY) ×3 IMPLANT
PACK UNIVERSAL I (CUSTOM PROCEDURE TRAY) ×3 IMPLANT
PAD ARMBOARD 7.5X6 YLW CONV (MISCELLANEOUS) ×3 IMPLANT
SPONGE LAP 18X18 X RAY DECT (DISPOSABLE) ×2 IMPLANT
SUT MNCRL AB 4-0 PS2 18 (SUTURE) ×3 IMPLANT
SUT MON AB 2-0 CT1 36 (SUTURE) ×3 IMPLANT
SUT VIC AB 0 CT1 27 (SUTURE) ×3
SUT VIC AB 0 CT1 27XBRD ANBCTR (SUTURE) ×1 IMPLANT
SUT VIC AB 1 CT1 27 (SUTURE) ×3
SUT VIC AB 1 CT1 27XBRD ANBCTR (SUTURE) ×1 IMPLANT
SYR 50ML LL SCALE MARK (SYRINGE) ×3 IMPLANT
SYRINGE 20CC LL (MISCELLANEOUS) IMPLANT
TOWEL OR 17X24 6PK STRL BLUE (TOWEL DISPOSABLE) ×3 IMPLANT
TOWEL OR 17X26 10 PK STRL BLUE (TOWEL DISPOSABLE) ×3 IMPLANT
TOWEL OR NON WOVEN STRL DISP B (DISPOSABLE) ×3 IMPLANT
TRAY FOLEY CATH 16FRSI W/METER (SET/KITS/TRAYS/PACK) IMPLANT
WATER STERILE IRR 1000ML POUR (IV SOLUTION) ×3 IMPLANT

## 2015-03-25 NOTE — Progress Notes (Signed)
Utilization review completed.  

## 2015-03-25 NOTE — Interval H&P Note (Signed)
History and Physical Interval Note:  03/25/2015 7:25 AM  [name]  has presented today for surgery, with the diagnosis of OA RIGHT HIP  The various methods of treatment have been discussed with the patient and family. After consideration of risks, benefits and other options for treatment, the patient has consented to  Procedure(s): RIGHT TOTAL HIP ARTHROPLASTY ANTERIOR APPROACH (Right) as a surgical intervention .  The patient's history has been reviewed, patient examined, no change in status, stable for surgery.  I have reviewed the patient's chart and labs.  Questions were answered to the patient's satisfaction.     Jerlyn Pain D

## 2015-03-25 NOTE — Anesthesia Procedure Notes (Signed)
Procedure Name: Intubation Date/Time: 03/25/2015 11:12 AM Performed by: Maxwell Caul Pre-anesthesia Checklist: Patient identified, Emergency Drugs available, Suction available, Patient being monitored and Timeout performed Patient Re-evaluated:Patient Re-evaluated prior to inductionOxygen Delivery Method: Circle system utilized Preoxygenation: Pre-oxygenation with 100% oxygen Intubation Type: IV induction Ventilation: Mask ventilation without difficulty Laryngoscope Size: Mac and 4 Grade View: Grade I Tube type: Oral Tube size: 7.5 mm Number of attempts: 1 Airway Equipment and Method: Stylet Placement Confirmation: ETT inserted through vocal cords under direct vision,  positive ETCO2 and breath sounds checked- equal and bilateral Secured at: 22 cm Tube secured with: Tape Dental Injury: Teeth and Oropharynx as per pre-operative assessment

## 2015-03-25 NOTE — Transfer of Care (Signed)
Immediate Anesthesia Transfer of Care Note  Patient: [name]  Procedure(s) Performed: Procedure(s): RIGHT TOTAL HIP ARTHROPLASTY ANTERIOR APPROACH (Right)  Patient Location: PACU  Anesthesia Type:General  Level of Consciousness:  sedated, patient cooperative and responds to stimulation  Airway & Oxygen Therapy:Patient Spontanous Breathing and Patient connected to face mask oxgen  Post-op Assessment:  Report given to PACU RN and Post -op Vital signs reviewed and stable  Post vital signs:  Reviewed and stable  Last Vitals:  Filed Vitals:   03/25/15 0809  BP: 118/46  Pulse: 67  Temp: 36.7 C  Resp: 20    Complications: No apparent anesthesia complications

## 2015-03-25 NOTE — Anesthesia Preprocedure Evaluation (Addendum)
Anesthesia Evaluation  Patient identified by MRN, date of birth, ID band Patient awake    Reviewed: Allergy & Precautions, NPO status , Patient's Chart, lab work & pertinent test results  Airway Mallampati: II  TM Distance: >3 FB Neck ROM: Full    Dental  (+) Teeth Intact, Dental Advisory Given   Pulmonary Current Smoker,  breath sounds clear to auscultation        Cardiovascular Rhythm:Regular Rate:Normal     Neuro/Psych    GI/Hepatic   Endo/Other    Renal/GU      Musculoskeletal   Abdominal   Peds  Hematology   Anesthesia Other Findings   Reproductive/Obstetrics                            Anesthesia Physical Anesthesia Plan  ASA: II  Anesthesia Plan: General   Post-op Pain Management:    Induction: Intravenous  Airway Management Planned: Oral ETT  Additional Equipment:   Intra-op Plan:   Post-operative Plan:   Informed Consent: I have reviewed the patients History and Physical, chart, labs and discussed the procedure including the risks, benefits and alternatives for the proposed anesthesia with the patient or authorized representative who has indicated his/her understanding and acceptance.   Dental advisory given  Plan Discussed with: CRNA and Anesthesiologist  Anesthesia Plan Comments:        Anesthesia Quick Evaluation

## 2015-03-25 NOTE — Op Note (Signed)
03/25/2015  12:58 PM  PATIENT:  [name]   MRN: [id number]  PRE-OPERATIVE DIAGNOSIS:  OA RIGHT HIP  POST-OPERATIVE DIAGNOSIS:  same  PROCEDURE:  Procedure(s): RIGHT TOTAL HIP ARTHROPLASTY ANTERIOR APPROACH  PREOPERATIVE INDICATIONS:    [name] is an 54 y.o. female who has a diagnosis of <principal problem not specified> and elected for surgical management after failing conservative treatment.  The risks benefits and alternatives were discussed with the patient including but not limited to the risks of nonoperative treatment, versus surgical intervention including infection, bleeding, nerve injury, periprosthetic fracture, the need for revision surgery, dislocation, leg length discrepancy, blood clots, cardiopulmonary complications, morbidity, mortality, among others, and they were willing to proceed.     OPERATIVE REPORT     SURGEON:   Joell Usman, Ernesta Amble, MD    ASSISTANT:  Lovett Calender, PA-C, She was present and scrubbed throughout the case, critical for completion in a timely fashion, and for retraction, instrumentation, and closure.     ANESTHESIA:  General    COMPLICATIONS:  None.     COMPONENTS:  Stryker acolade fit femur size 4 with a 36 mm -2.5 head ball and a PSL acetabular shell size 54 with a  polyethylene liner    PROCEDURE IN DETAIL:   The patient was met in the holding area and  identified.  The appropriate hip was identified and marked at the operative site.  The patient was then transported to the OR  and  placed under general anesthesia.  At that point, the patient was  placed in the supine position and  secured to the operating room table and all bony prominences padded. He received pre-operative antibiotics    The operative lower extremity was prepped from the iliac crest to the distal leg.  Sterile draping was performed.  Time out was performed prior to incision.      Skin incision was made just 2 cm lateral to the ASIS  extending in line with the  tensor fascia lata. Electrocautery was used to control all bleeders. I dissected down sharply to the fascia of the tensor fascia lata was confirmed that the muscle fibers beneath were running posteriorly. I then incised the fascia over the superficial tensor fascia lata in line with the incision. The fascia was elevated off the anterior aspect of the muscle the muscle was retracted posteriorly and protected throughout the case. I then used electrocautery to incise the tensor fascia lata fascia control and all bleeders. Immediately visible was the fat over top of the anterior neck and capsule.  I removed the anterior fat from the capsule and elevated the rectus muscle off of the anterior capsule. I then removed a large time of capsule. The retractors were then placed over the anterior acetabulum as well as around the superior and inferior neck.  I then removed a section of the femoral neck and a napkin ring fashion. Then used the power course to remove the femoral head from the acetabulum and thoroughly irrigated the acetabulum. I sized the femoral head.    I then exposed the deep acetabulum, cleared out any tissue including the ligamentum teres.   After adequate visualization, I excised the labrum, and then sequentially reamed.  I placed the trial acetabulum, which seated nicely, and then impacted the real cup into place.  Appropriate version and inclination was confirmed clinically matching their bony anatomy, and also with the use transverse acetabular ligament.  I placed a 67m screw in the posterior/superio position with  an excellent bite.    I then placed the polyethylene liner in place  I then abducted the leg and released the external rotators from the posterior femur allowing it to be easily delivered up lateral and anterior to the acetabulum for preparation of the femoral canal.    I then prepared the proximal femur using the cookie-cutter and then sequentially reamed and broached.  A trial  broach, neck, and head was utilized, and I reduced the hip and it was found to have excellent stability with functional range of motion..  I then impacted the real femoral prosthesis into place into the appropriate version, slightly anteverted to the normal anatomy, and I impacted the real head ball into place. The hip was then reduced and taken through functional range of motion and found to have excellent stability. Leg lengths were restored.  I then irrigated the hip copiously again with, and repaired the fascia with Vicryl, followed by monocryl for the subcutaneous tissue, Monocryl for the skin, Steri-Strips and sterile gauze. The wounds were injected. The patient was then awakened and returned to PACU in stable and satisfactory condition. There were no complications.  POST OPERATIVE PLAN: WBAT, DVT px: SCD's/TED and ASA  Lynn Lynch, MD Orthopedic Surgeon [phone]   This note was generated using a template and dragon dictation system. In light of that, I have reviewed the note and all aspects of it are applicable to this case. Any dictation errors are due to the computerized dictation system.

## 2015-03-25 NOTE — Anesthesia Postprocedure Evaluation (Signed)
  Anesthesia Post-op Note  Patient: [name]  Procedure(s) Performed: Procedure(s): RIGHT TOTAL HIP ARTHROPLASTY ANTERIOR APPROACH (Right)  Patient Location: PACU  Anesthesia Type:General  Level of Consciousness: awake, alert , oriented and patient cooperative  Airway and Oxygen Therapy: Patient Spontanous Breathing and Patient connected to nasal cannula oxygen  Post-op Pain: mild  Post-op Assessment: Post-op Vital signs reviewed, Patient's Cardiovascular Status Stable, Respiratory Function Stable, Patent Airway, No signs of Nausea or vomiting and Pain level controlled  Post-op Vital Signs: Reviewed and stable  Last Vitals:  Filed Vitals:   03/25/15 1645  BP:   Pulse: 100  Temp:   Resp: 33    Complications: No apparent anesthesia complications

## 2015-03-25 NOTE — Discharge Instructions (Signed)

## 2015-03-25 NOTE — Plan of Care (Signed)
Problem: Consults Goal: Diagnosis- Total Joint Replacement Primary Total Hip Right     

## 2015-03-26 ENCOUNTER — Encounter (HOSPITAL_COMMUNITY): Payer: Self-pay | Admitting: Orthopedic Surgery

## 2015-03-26 NOTE — Progress Notes (Signed)
     Subjective:  POD#1 R hip arthroplasty. Patient reports pain as moderate.  Resting comfortably in bed.  Objective:   VITALS:   Filed Vitals:   03/25/15 1746 03/25/15 2011 03/26/15 0134 03/26/15 0507  BP: 110/66 105/53 96/47 100/47  Pulse: 70 67 77 93  Temp: 98.6 F (37 C) 98.4 F (36.9 C) 98.6 F (37 C) 99.1 F (37.3 C)  TempSrc: Oral Oral Oral   Resp: 14 16 16 15   Weight:      SpO2: 100% 97% 94% 94%    Neurologically intact ABD soft Neurovascular intact Sensation intact distally Intact pulses distally Dorsiflexion/Plantar flexion intact Incision: dressing C/D/I   Lab Results  Component Value Date   WBC 6.7 03/13/2015   HGB 12.0 03/13/2015   HCT 36.4 03/13/2015   MCV 87.7 03/13/2015   PLT 141* 03/13/2015   BMET    Component Value Date/Time   NA 137 03/13/2015 1040   K 3.6 03/13/2015 1040   CL 98 03/13/2015 1040   CO2 30 03/13/2015 1040   GLUCOSE 93 03/13/2015 1040   BUN 9 03/13/2015 1040   CREATININE 0.75 03/13/2015 1040   CALCIUM 10.0 03/13/2015 1040   GFRNONAA >90 03/13/2015 1040   GFRAA >90 03/13/2015 1040     Assessment/Plan: 1 Day Post-Op   Active Problems:   DJD (degenerative joint disease)   Up with therapy WBAT in the RLE ASA 325mg  PO daily for 30 days post op for DVT prophylaxis Plan to discharge home today if 2 sessions with PT go well.   Yeraldi Fidler Lelan Pons 03/26/2015, 7:44 AM Cell [phone]

## 2015-03-26 NOTE — Discharge Summary (Signed)
Physician Discharge Summary  Patient ID: [name] MRN: [id number] DOB/AGE: [birth year] 54 y.o.  Admit date: 03/25/2015 Discharge date: 03/26/2015  Admission Diagnoses:  <principal problem not specified>  Discharge Diagnoses:  Active Problems:   DJD (degenerative joint disease)   Past Medical History  Diagnosis Date  . Alcoholic pancreatitis 27/5170    01/15/13  . Chronic pain     treated at [facility]   . Depression   . Anxiety   . Anemia     Reports being borderline  . Fibromyalgia   . History of blood transfusion     "related to ORs"  . Migraine     "monthly" (03/25/2015)  . Osteoarthritis     "wrists, knees, back, neck, fingers,  q joint" (03/25/2015)  . Chronic lower back pain     Surgeries: Procedure(s): RIGHT TOTAL HIP ARTHROPLASTY ANTERIOR APPROACH on 03/25/2015   Consultants (if any):    Discharged Condition: Improved  Hospital Course: [name] is an 54 y.o. female who was admitted 03/25/2015 with a diagnosis of <principal problem not specified> and went to the operating room on 03/25/2015 and underwent the above named procedures.    She was given perioperative antibiotics:  Anti-infectives    Start     Dose/Rate Route Frequency Ordered Stop   03/25/15 1715  ceFAZolin (ANCEF) IVPB 2 g/50 mL premix     2 g 100 mL/hr over 30 Minutes Intravenous Every 6 hours 03/25/15 1713 03/26/15 0000   03/25/15 0600  ceFAZolin (ANCEF) IVPB 2 g/50 mL premix     2 g 100 mL/hr over 30 Minutes Intravenous On call to O.R. 03/24/15 1426 03/25/15 1120    .  She was given sequential compression devices, early ambulation, and ASA 325mg  for DVT prophylaxis.  She benefited maximally from the hospital stay and there were no complications.    Recent vital signs:  Filed Vitals:   03/26/15 0507  BP: 100/47  Pulse: 93  Temp: 99.1 F (37.3 C)  Resp: 15    Recent laboratory studies:  Lab Results  Component Value Date   HGB 12.0 03/13/2015   HGB 12.5 01/01/2015    HGB 13.1 07/23/2009   Lab Results  Component Value Date   WBC 6.7 03/13/2015   PLT 141* 03/13/2015   Lab Results  Component Value Date   INR 1.01 03/13/2015   Lab Results  Component Value Date   NA 137 03/13/2015   K 3.6 03/13/2015   CL 98 03/13/2015   CO2 30 03/13/2015   BUN 9 03/13/2015   CREATININE 0.75 03/13/2015   GLUCOSE 93 03/13/2015    Discharge Medications:     Medication List    STOP taking these medications        aspirin EC 325 MG tablet  Replaced by:  aspirin 325 MG tablet     celecoxib 200 MG capsule  Commonly known as:  CELEBREX      TAKE these medications        amitriptyline 10 MG tablet  Commonly known as:  ELAVIL  Take 1.5 tablets (15 mg total) by mouth at bedtime.     aspirin 325 MG tablet  Take 1 tablet (325 mg total) by mouth daily.     baclofen 10 MG tablet  Commonly known as:  LIORESAL  Take 10 mg by mouth 2 (two) times daily.     buPROPion 300 MG 24 hr tablet  Commonly known as:  WELLBUTRIN XL  Take 300  mg by mouth daily.     CALCIUM/VITAMIN D/MINERALS 600-200 MG-UNIT Tabs  Take 1 tablet by mouth daily.     DILAUDID 2 MG tablet  Generic drug:  HYDROmorphone  Take 2 mg by mouth every 6 (six) hours as needed for severe pain.     docusate sodium 100 MG capsule  Commonly known as:  COLACE  Take 1 capsule (100 mg total) by mouth 2 (two) times daily.     fentaNYL 50 MCG/HR  Commonly known as:  DURAGESIC - dosed mcg/hr  Place 50 mcg onto the skin every other day.     Fish Oil 1000 MG Caps  Take 1,000 mg by mouth daily.     gabapentin 300 MG capsule  Commonly known as:  NEURONTIN  Take 600-1,200 mg by mouth 3 (three) times daily. 2 TAB IN THE MORNING, 2 TAB AT LUNCH, 4 TABS AT BEDTIME     meclizine 25 MG tablet  Commonly known as:  ANTIVERT  Take 25 mg by mouth 3 (three) times daily as needed for dizziness.     meloxicam 15 MG tablet  Commonly known as:  MOBIC  Take 1 tablet (15 mg total) by mouth daily.      MULTIVITAMIN PO  Take 1 tablet by mouth daily.     ondansetron 4 MG tablet  Commonly known as:  ZOFRAN  Take 1 tablet (4 mg total) by mouth every 8 (eight) hours as needed for nausea or vomiting.     oxyCODONE 5 MG immediate release tablet  Commonly known as:  ROXICODONE  Take 1 tablet (5 mg total) by mouth every 4 (four) hours as needed for severe pain.     oxyCODONE-acetaminophen 5-325 MG per tablet  Commonly known as:  PERCOCET  Take 1-2 tablets by mouth every 4 (four) hours as needed for severe pain.     SUMAtriptan 100 MG tablet  Commonly known as:  IMITREX  Take 100 mg by mouth every 2 (two) hours as needed for migraine.     VITAMIN B 12 PO  Take 500 mcg by mouth.        Diagnostic Studies: Dg Pelvis Portable  03/25/2015   CLINICAL DATA:  Postop right hip replacement  EXAM: PORTABLE PELVIS 1-2 VIEWS  COMPARISON:  01/07/2015  FINDINGS: Right total hip arthroplasty in satisfactory position.  Status post left hip arthroplasty, unchanged.  Visualized bony pelvis appears intact  IMPRESSION: Right total hip arthroplasty in satisfactory position.  Status post left total hip arthroplasty, unchanged.   Electronically Signed   By: Julian Hy M.D.   On: 03/25/2015 14:12    Disposition: 03-Skilled Nursing Facility      Discharge Instructions    Weight bearing as tolerated    Complete by:  As directed   Laterality:  right  Extremity:  Lower           Follow-up Information    Follow up with MURPHY, TIMOTHY D, MD In 1 week.   Specialty:  Orthopedic Surgery   Contact information:   [address] [place] [address] [phone]        Signed: Gae Dry 03/26/2015, 7:47 AM Cell [phone]

## 2015-03-26 NOTE — Progress Notes (Signed)
OT Cancellation Note  Patient Details Name: ABBEE CREMEENS MRN: [id number] DOB: 10/03/1961   Cancelled Treatment:    Reason Eval/Treat Not Completed: Fatigue/lethargy limiting ability to participate. Pt unable to stay awake during education, but did state that she has DME at home. Acute OT will follow up this afternoon to determine d/c needs from OT standpoint. Per PT, pt is moving well at [facility] guard level.   [place] Herb M   Cyndie Chime, OTR/L Occupational Therapist [phone] (pager)  03/26/2015, 11:01 AM

## 2015-03-26 NOTE — Evaluation (Signed)
Physical Therapy Evaluation Patient Details Name: [name] MRN: [id number] DOB: 10/12/1961 Today's Date: 03/26/2015   History of Present Illness  Pt is a 54 y/o F s/p R THA, direct ant approach.  Pt's PMH includes chronic pain, depression, anxiety, anemia, fibromyalgia, and chronic LBP.  Clinical Impression  Pt is s/p R THA resulting in the deficits listed below (see PT Problem List). Pt demonstrated ability to ambulate 250 ft and ascend/descend 4 steps this session. Pt will benefit from skilled PT to increase their independence and safety with mobility to allow discharge to the venue listed below.      Follow Up Recommendations Outpatient PT;Supervision for mobility/OOB    Equipment Recommendations  None recommended by PT    Recommendations for Other Services OT consult     Precautions / Restrictions Precautions Precautions: Fall Restrictions Weight Bearing Restrictions: Yes RLE Weight Bearing: Weight bearing as tolerated      Mobility  Bed Mobility               General bed mobility comments: in recliner  Transfers Overall transfer level: Needs assistance Equipment used: Rolling walker (2 wheeled) Transfers: Sit to/from Stand Sit to Stand: Supervision         General transfer comment: supervision for safety.  Pt w/ recent L THA w/ good carryover for technique.  Ambulation/Gait Ambulation/Gait assistance: Min guard Ambulation Distance (Feet): 250 Feet Assistive device: Rolling walker (2 wheeled) Gait Pattern/deviations: Step-to pattern;Step-through pattern;Decreased stride length;Decreased stance time - right;Antalgic;Narrow base of support   Gait velocity interpretation: Below normal speed for age/gender General Gait Details: R hip IR, slight R knee buckle x1 initially which was prevented w/ verbal cue to tighten RLE musculature during stance phase.    Stairs Stairs: Yes Stairs assistance: Min guard Stair Management: No rails;Backwards;With  walker;Step to pattern Number of Stairs: 2 (x2) General stair comments: verbal cues for proper sequencing.  Good carryover from recent L THA.  Wheelchair Mobility    Modified Rankin (Stroke Patients Only)       Balance Overall balance assessment: Needs assistance Sitting-balance support: No upper extremity supported;Feet supported Sitting balance-Leahy Scale: Good     Standing balance support: Bilateral upper extremity supported;During functional activity Standing balance-Leahy Scale: Fair                               Pertinent Vitals/Pain Pain Assessment: 0-10 Pain Score: 7  Pain Location: R hip down to R knee Pain Descriptors / Indicators: Aching;Grimacing;Guarding;Discomfort Pain Intervention(s): Limited activity within patient's tolerance;Monitored during session;Repositioned;RN gave pain meds during session    [name] expects to be discharged to:: Private residence Living Arrangements: Spouse/significant other Available Help at Discharge: Available 24 hours/day;Family Type of Home: House Home Access: Stairs to enter Entrance Stairs-Rails: None Entrance Stairs-Number of Steps: 2 Home Layout: One level Home Equipment: Cane - single point;Walker - 2 wheels;Bedside commode      Prior Function Level of Independence: Independent with assistive device(s)         Comments: used RW for one day after L THA at end of Feb, has been using cane toward the end of the day b/c of pain     Hand Dominance   Dominant Hand: Right    Extremity/Trunk Assessment   Upper Extremity Assessment: Defer to OT evaluation           Lower Extremity Assessment: RLE deficits/detail;Generalized weakness RLE Deficits / Details: weakness  and limited ROM as expected s/p R THA       Communication   Communication: No difficulties  Cognition Arousal/Alertness: Awake/alert Behavior During Therapy: WFL for tasks assessed/performed Overall Cognitive  Status: Within Functional Limits for tasks assessed                      General Comments      Exercises Total Joint Exercises Ankle Circles/Pumps: AROM;Both;10 reps;Seated Heel Slides: AROM;Right;5 reps;Seated Long Arc Quad: AROM;Right;15 reps;Seated Marching in Standing: AROM;Both;15 reps;Seated      Assessment/Plan    PT Assessment Patient needs continued PT services  PT Diagnosis Difficulty walking;Abnormality of gait;Generalized weakness;Acute pain   PT Problem List Decreased strength;Decreased range of motion;Decreased activity tolerance;Decreased balance;Decreased coordination;Decreased mobility;Decreased knowledge of use of DME;Decreased safety awareness;Decreased knowledge of precautions;Decreased skin integrity;Pain  PT Treatment Interventions DME instruction;Gait training;Stair training;Functional mobility training;Therapeutic activities;Therapeutic exercise;Balance training;Neuromuscular re-education;Patient/family education;Modalities   PT Goals (Current goals can be found in the Care Plan section) Acute Rehab PT Goals Patient Stated Goal: to go home today and to get stronger so she can play with her 47 y/o grandson PT Goal Formulation: With patient/family Time For Goal Achievement: 04/02/15 Potential to Achieve Goals: Good    Frequency 7X/week   Barriers to discharge Inaccessible home environment 2 steps to enter home w/o rails    Co-evaluation               End of Session Equipment Utilized During Treatment: Gait belt Activity Tolerance: Patient tolerated treatment well Patient left: in chair;with call bell/phone within reach;with family/visitor present Nurse Communication: Mobility status;Precautions;Weight bearing status         Time: 0900-0927 PT Time Calculation (min) (ACUTE ONLY): 27 min   Charges:   PT Evaluation $Initial PT Evaluation Tier I: 1 Procedure PT Treatments $Gait Training: 8-22 mins   PT G CodesJoslyn Hy PT, DPT [phone] Pager: [phone] 03/26/2015, 9:38 AM

## 2015-03-26 NOTE — Progress Notes (Signed)
Physical Therapy Treatment Patient Details Name: [name] MRN: [id number] DOB: July 16, 1961 Today's Date: 03/26/2015    History of Present Illness Pt is a 54 y/o F s/p R THA, direct ant approach.  Pt's PMH includes chronic pain, depression, anxiety, anemia, fibromyalgia, and chronic LBP.    PT Comments    Patient is making good progress with PT.  From a mobility standpoint anticipate patient will be ready for DC home today or tomorrow pending medical clearance.  Pt has mild>mod symptomatic low BP which limited ambulatory distance this session; however pt continues to progress w/ PT.  BP: 88/46 sitting prior to activity, 85/49 standing, 90/48 sitting in recliner after ambulation and exercises.    Follow Up Recommendations  Outpatient PT;Supervision for mobility/OOB     Equipment Recommendations  None recommended by PT    Recommendations for Other Services OT consult     Precautions / Restrictions Precautions Precautions: Fall Restrictions Weight Bearing Restrictions: Yes RLE Weight Bearing: Weight bearing as tolerated    Mobility  Bed Mobility               General bed mobility comments: in recliner  Transfers Overall transfer level: Needs assistance Equipment used: Rolling walker (2 wheeled) Transfers: Sit to/from Stand Sit to Stand: Min guard         General transfer comment: Min guard for safety 2/2 pt's low BP.  Otherwise good carryover from last session.  Ambulation/Gait Ambulation/Gait assistance: Min guard Ambulation Distance (Feet): 100 Feet Assistive device: Rolling walker (2 wheeled) Gait Pattern/deviations: Step-through pattern;Antalgic;Narrow base of support   Gait velocity interpretation: Below normal speed for age/gender General Gait Details: Min guard for safety 2/2 pt's low BP.  Cues to slightly widen BOS as pt demonstrates crossover gait.   Stairs            Wheelchair Mobility    Modified Rankin (Stroke Patients Only)        Balance Overall balance assessment: Needs assistance Sitting-balance support: No upper extremity supported;Feet supported Sitting balance-Leahy Scale: Fair     Standing balance support: Bilateral upper extremity supported;During functional activity Standing balance-Leahy Scale: Fair                      Cognition Arousal/Alertness: Awake/alert Behavior During Therapy: WFL for tasks assessed/performed Overall Cognitive Status: Within Functional Limits for tasks assessed                      Exercises Total Joint Exercises Long Arc Quad: AROM;Both;15 reps;Seated    General Comments General comments (skin integrity, edema, etc.): BP: 88/46 sitting prior to activity, 85/49 standing, 90/48 sitting in recliner after ambulation and exercises.      Pertinent Vitals/Pain Pain Assessment: 0-10 Pain Score: 4  Pain Location: R hip Pain Descriptors / Indicators: Aching Pain Intervention(s): Limited activity within patient's tolerance;Monitored during session;Repositioned    Home Living                      Prior Function            PT Goals (current goals can now be found in the care plan section) Acute Rehab PT Goals Patient Stated Goal: to go home today Progress towards PT goals: Progressing toward goals    Frequency  7X/week    PT Plan Current plan remains appropriate    Co-evaluation             End of  Session Equipment Utilized During Treatment: Gait belt Activity Tolerance: Patient tolerated treatment well (limited due to mild>mod dizziness) Patient left: in chair;with call bell/phone within reach;with nursing/sitter in room;with family/visitor present     Time: 2712-9290 PT Time Calculation (min) (ACUTE ONLY): 27 min  Charges:  $Gait Training: 23-37 mins                    G Codes:      Joslyn Hy PT, [place] [phone] Pager: [phone] 03/26/2015, 2:00 PM

## 2015-03-31 DIAGNOSIS — M1990 Unspecified osteoarthritis, unspecified site: Secondary | ICD-10-CM | POA: Diagnosis not present

## 2015-03-31 DIAGNOSIS — Z96641 Presence of right artificial hip joint: Secondary | ICD-10-CM | POA: Diagnosis not present

## 2015-03-31 DIAGNOSIS — G894 Chronic pain syndrome: Secondary | ICD-10-CM | POA: Diagnosis not present

## 2015-03-31 DIAGNOSIS — K860 Alcohol-induced chronic pancreatitis: Secondary | ICD-10-CM | POA: Diagnosis not present

## 2015-03-31 DIAGNOSIS — Z471 Aftercare following joint replacement surgery: Secondary | ICD-10-CM | POA: Diagnosis not present

## 2015-03-31 DIAGNOSIS — M545 Low back pain: Secondary | ICD-10-CM | POA: Diagnosis not present

## 2015-03-31 DIAGNOSIS — M797 Fibromyalgia: Secondary | ICD-10-CM | POA: Diagnosis not present

## 2015-04-02 DIAGNOSIS — K860 Alcohol-induced chronic pancreatitis: Secondary | ICD-10-CM | POA: Diagnosis not present

## 2015-04-02 DIAGNOSIS — M1990 Unspecified osteoarthritis, unspecified site: Secondary | ICD-10-CM | POA: Diagnosis not present

## 2015-04-02 DIAGNOSIS — Z96641 Presence of right artificial hip joint: Secondary | ICD-10-CM | POA: Diagnosis not present

## 2015-04-02 DIAGNOSIS — G894 Chronic pain syndrome: Secondary | ICD-10-CM | POA: Diagnosis not present

## 2015-04-02 DIAGNOSIS — M545 Low back pain: Secondary | ICD-10-CM | POA: Diagnosis not present

## 2015-04-02 DIAGNOSIS — M797 Fibromyalgia: Secondary | ICD-10-CM | POA: Diagnosis not present

## 2015-04-02 DIAGNOSIS — Z471 Aftercare following joint replacement surgery: Secondary | ICD-10-CM | POA: Diagnosis not present

## 2015-04-04 DIAGNOSIS — G894 Chronic pain syndrome: Secondary | ICD-10-CM | POA: Diagnosis not present

## 2015-04-04 DIAGNOSIS — K860 Alcohol-induced chronic pancreatitis: Secondary | ICD-10-CM | POA: Diagnosis not present

## 2015-04-04 DIAGNOSIS — Z471 Aftercare following joint replacement surgery: Secondary | ICD-10-CM | POA: Diagnosis not present

## 2015-04-04 DIAGNOSIS — Z96641 Presence of right artificial hip joint: Secondary | ICD-10-CM | POA: Diagnosis not present

## 2015-04-04 DIAGNOSIS — M1990 Unspecified osteoarthritis, unspecified site: Secondary | ICD-10-CM | POA: Diagnosis not present

## 2015-04-04 DIAGNOSIS — M545 Low back pain: Secondary | ICD-10-CM | POA: Diagnosis not present

## 2015-04-04 DIAGNOSIS — M797 Fibromyalgia: Secondary | ICD-10-CM | POA: Diagnosis not present

## 2015-04-08 DIAGNOSIS — M797 Fibromyalgia: Secondary | ICD-10-CM | POA: Diagnosis not present

## 2015-04-08 DIAGNOSIS — K860 Alcohol-induced chronic pancreatitis: Secondary | ICD-10-CM | POA: Diagnosis not present

## 2015-04-08 DIAGNOSIS — M1990 Unspecified osteoarthritis, unspecified site: Secondary | ICD-10-CM | POA: Diagnosis not present

## 2015-04-08 DIAGNOSIS — G894 Chronic pain syndrome: Secondary | ICD-10-CM | POA: Diagnosis not present

## 2015-04-08 DIAGNOSIS — Z96641 Presence of right artificial hip joint: Secondary | ICD-10-CM | POA: Diagnosis not present

## 2015-04-08 DIAGNOSIS — Z471 Aftercare following joint replacement surgery: Secondary | ICD-10-CM | POA: Diagnosis not present

## 2015-04-08 DIAGNOSIS — M545 Low back pain: Secondary | ICD-10-CM | POA: Diagnosis not present

## 2015-04-09 DIAGNOSIS — M1611 Unilateral primary osteoarthritis, right hip: Secondary | ICD-10-CM | POA: Diagnosis not present

## 2015-04-11 DIAGNOSIS — G894 Chronic pain syndrome: Secondary | ICD-10-CM | POA: Diagnosis not present

## 2015-04-11 DIAGNOSIS — Z471 Aftercare following joint replacement surgery: Secondary | ICD-10-CM | POA: Diagnosis not present

## 2015-04-11 DIAGNOSIS — Z96641 Presence of right artificial hip joint: Secondary | ICD-10-CM | POA: Diagnosis not present

## 2015-04-11 DIAGNOSIS — K860 Alcohol-induced chronic pancreatitis: Secondary | ICD-10-CM | POA: Diagnosis not present

## 2015-04-11 DIAGNOSIS — M1990 Unspecified osteoarthritis, unspecified site: Secondary | ICD-10-CM | POA: Diagnosis not present

## 2015-04-11 DIAGNOSIS — M797 Fibromyalgia: Secondary | ICD-10-CM | POA: Diagnosis not present

## 2015-04-11 DIAGNOSIS — M545 Low back pain: Secondary | ICD-10-CM | POA: Diagnosis not present

## 2015-06-05 DIAGNOSIS — G8929 Other chronic pain: Secondary | ICD-10-CM | POA: Diagnosis not present

## 2015-06-05 DIAGNOSIS — Z79899 Other long term (current) drug therapy: Secondary | ICD-10-CM | POA: Diagnosis not present

## 2015-06-05 DIAGNOSIS — G894 Chronic pain syndrome: Secondary | ICD-10-CM | POA: Diagnosis not present

## 2015-06-05 DIAGNOSIS — M25551 Pain in right hip: Secondary | ICD-10-CM | POA: Diagnosis not present

## 2015-06-05 DIAGNOSIS — M25552 Pain in left hip: Secondary | ICD-10-CM | POA: Diagnosis not present

## 2015-06-05 DIAGNOSIS — Z5181 Encounter for therapeutic drug level monitoring: Secondary | ICD-10-CM | POA: Diagnosis not present

## 2015-06-13 DIAGNOSIS — R609 Edema, unspecified: Secondary | ICD-10-CM | POA: Diagnosis not present

## 2015-06-25 IMAGING — CR DG PORTABLE PELVIS
2 series · 2 of 2 positions shown · non-contrast
Comparison: No preoperative imaging

CLINICAL DATA: Left total hip replacement

EXAM:
PORTABLE PELVIS 1-2 VIEWS

[[person_name] view (1 of 2)]
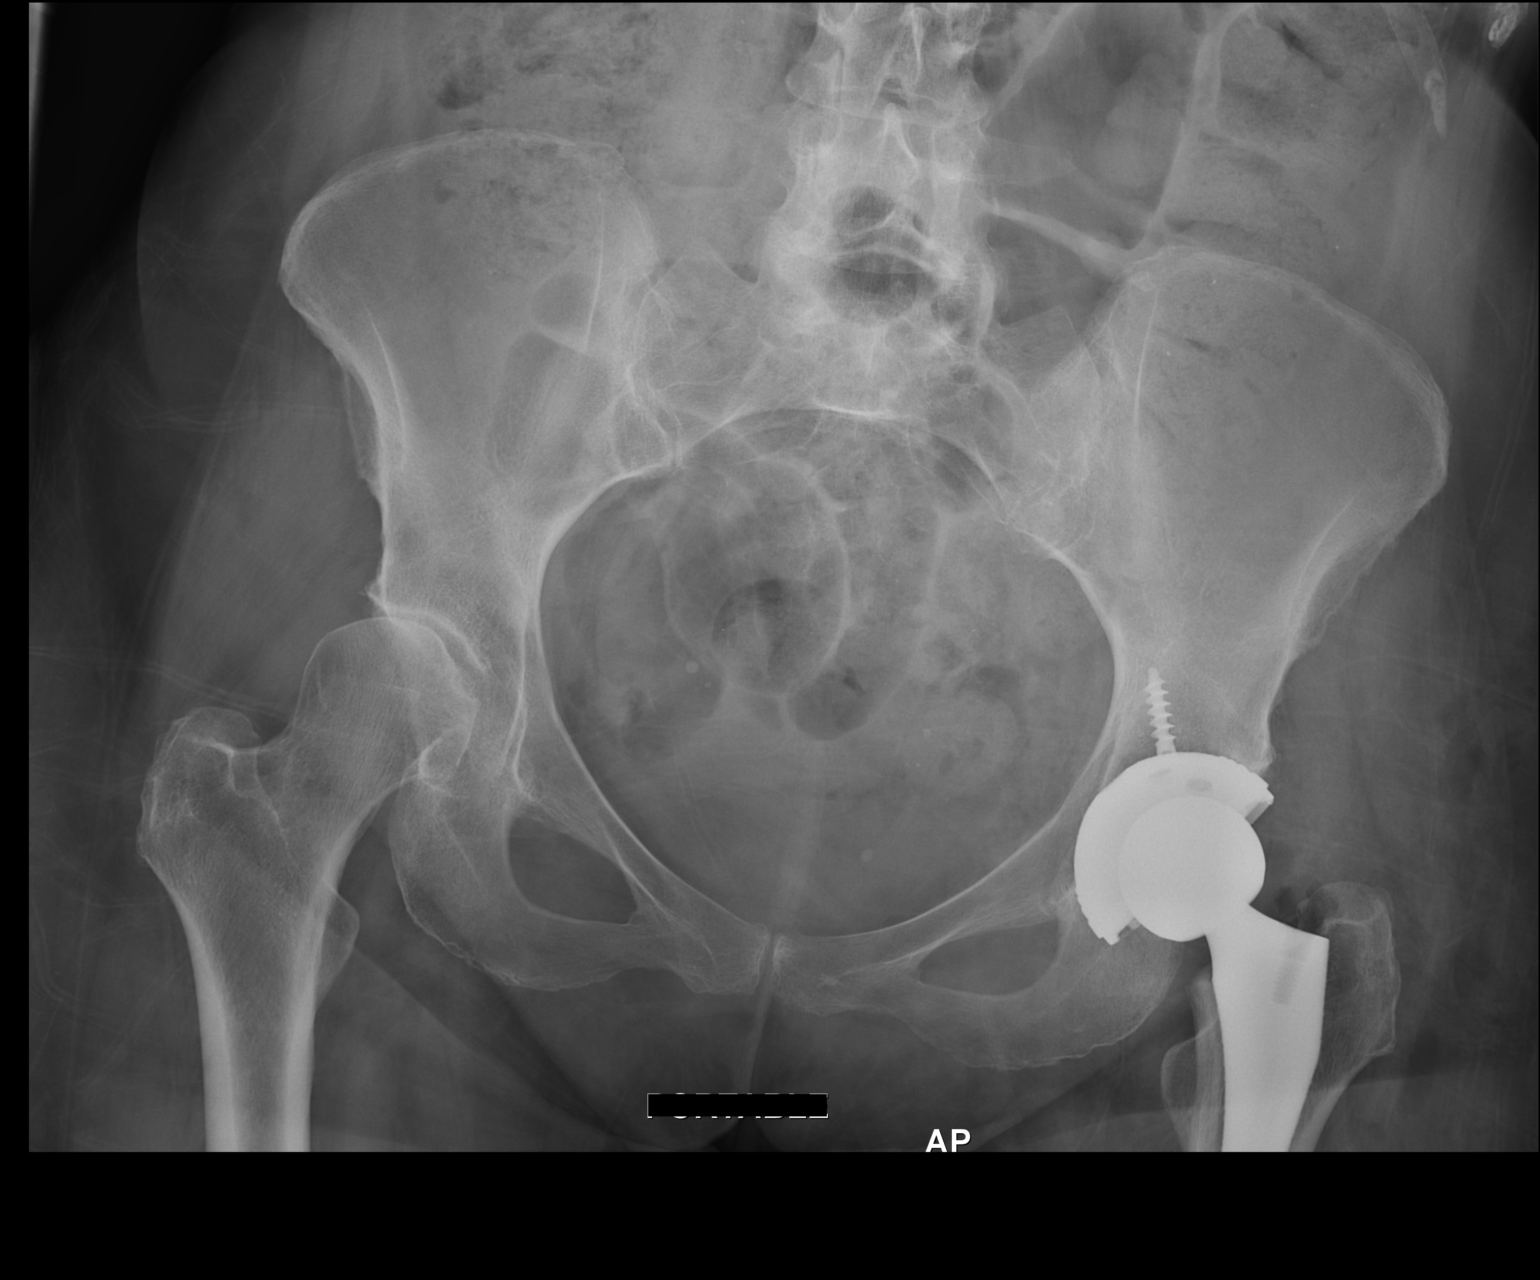

[[person_name] view (2 of 2)]
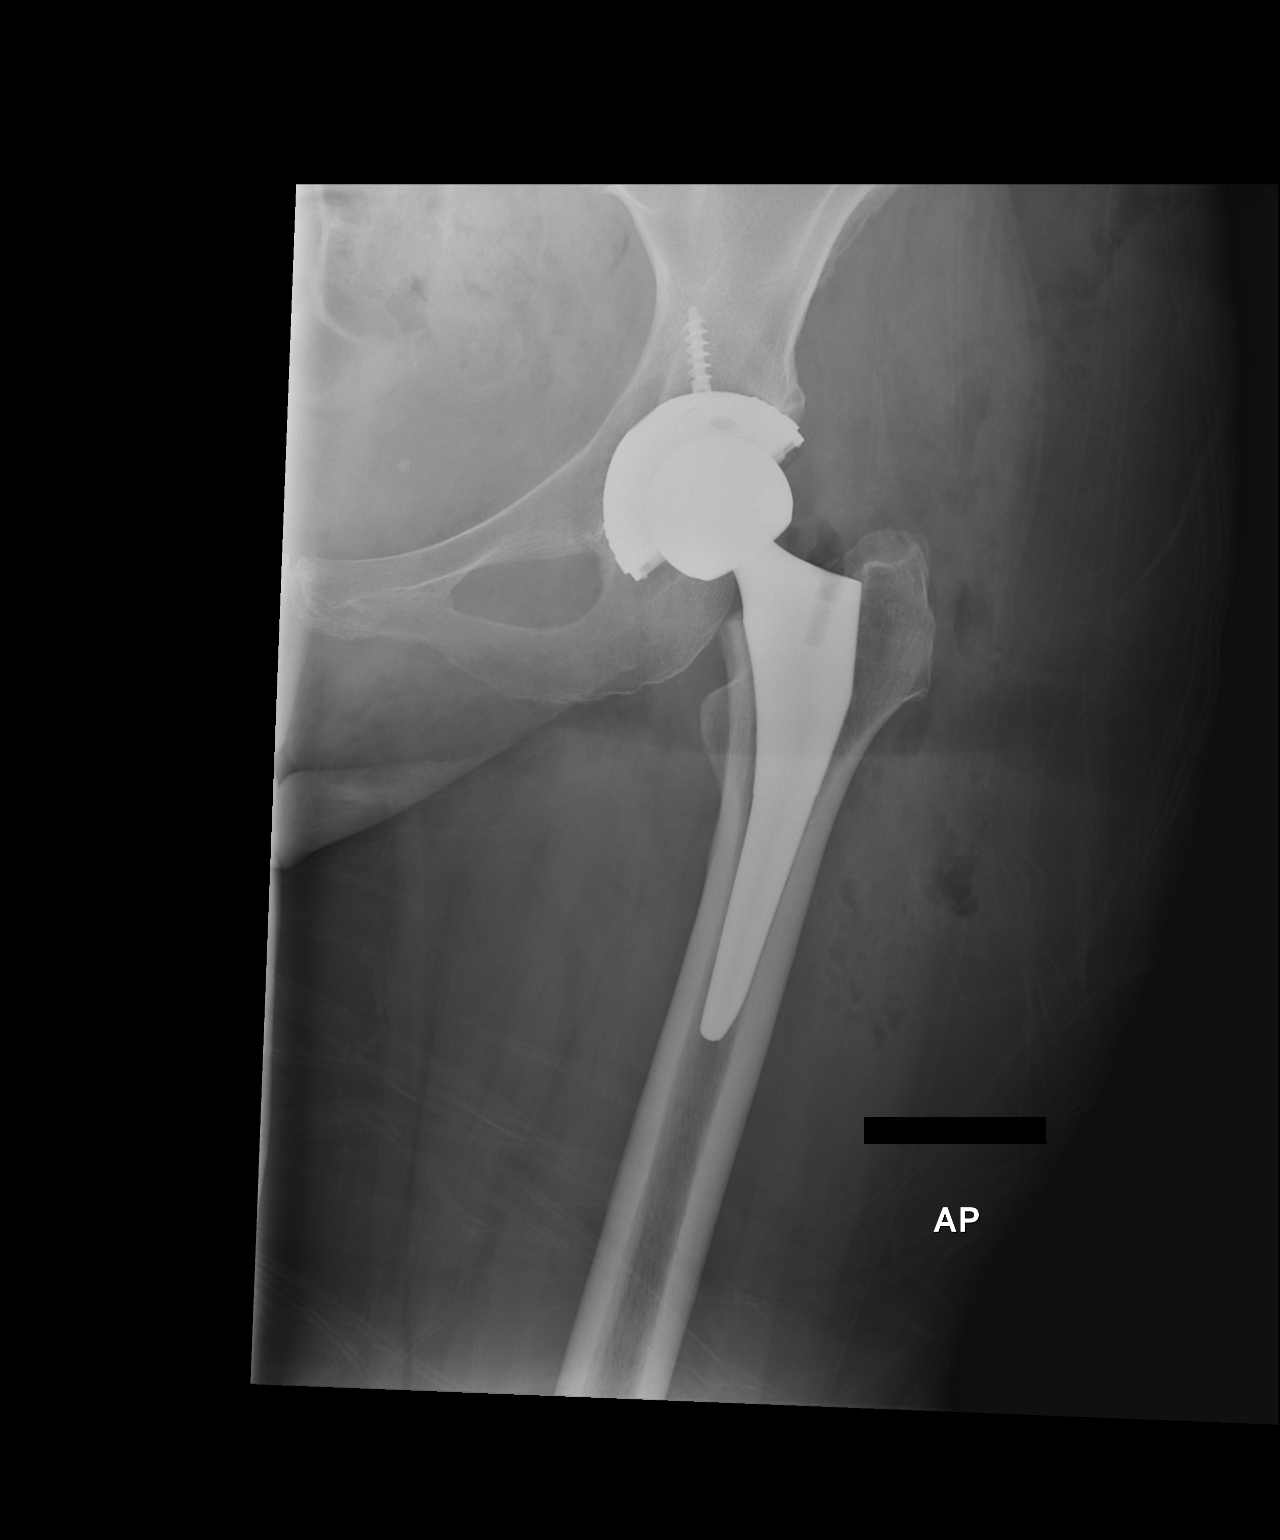

[2 of 2 positions shown; findings below may reference images not displayed]

FINDINGS: Evidence of left total hip arthroplasty noted. No evidence for
hardware failure. No fracture line identified. Soft tissue gas is
noted.
IMPRESSION: Expected postoperative appearance after left total hip arthroplasty.

## 2015-07-07 DIAGNOSIS — G894 Chronic pain syndrome: Secondary | ICD-10-CM | POA: Diagnosis not present

## 2015-07-07 DIAGNOSIS — M5136 Other intervertebral disc degeneration, lumbar region: Secondary | ICD-10-CM | POA: Diagnosis not present

## 2015-07-07 DIAGNOSIS — M4806 Spinal stenosis, lumbar region: Secondary | ICD-10-CM | POA: Diagnosis not present

## 2015-07-07 DIAGNOSIS — M545 Low back pain: Secondary | ICD-10-CM | POA: Diagnosis not present

## 2015-07-24 DIAGNOSIS — R609 Edema, unspecified: Secondary | ICD-10-CM | POA: Diagnosis not present

## 2015-07-24 DIAGNOSIS — M159 Polyosteoarthritis, unspecified: Secondary | ICD-10-CM | POA: Diagnosis not present

## 2015-07-30 ENCOUNTER — Other Ambulatory Visit: Payer: Self-pay

## 2015-07-30 DIAGNOSIS — R609 Edema, unspecified: Secondary | ICD-10-CM

## 2015-08-04 DIAGNOSIS — M25552 Pain in left hip: Secondary | ICD-10-CM | POA: Diagnosis not present

## 2015-08-04 DIAGNOSIS — G894 Chronic pain syndrome: Secondary | ICD-10-CM | POA: Diagnosis not present

## 2015-08-04 DIAGNOSIS — M25551 Pain in right hip: Secondary | ICD-10-CM | POA: Diagnosis not present

## 2015-08-04 DIAGNOSIS — M4806 Spinal stenosis, lumbar region: Secondary | ICD-10-CM | POA: Diagnosis not present

## 2015-09-01 DIAGNOSIS — M79672 Pain in left foot: Secondary | ICD-10-CM | POA: Diagnosis not present

## 2015-09-01 DIAGNOSIS — M25561 Pain in right knee: Secondary | ICD-10-CM | POA: Diagnosis not present

## 2015-09-01 DIAGNOSIS — G894 Chronic pain syndrome: Secondary | ICD-10-CM | POA: Diagnosis not present

## 2015-09-01 DIAGNOSIS — M79641 Pain in right hand: Secondary | ICD-10-CM | POA: Diagnosis not present

## 2015-09-12 ENCOUNTER — Encounter: Payer: Self-pay | Admitting: Vascular Surgery

## 2015-09-17 ENCOUNTER — Ambulatory Visit (INDEPENDENT_AMBULATORY_CARE_PROVIDER_SITE_OTHER): Payer: Commercial Managed Care - HMO | Admitting: Vascular Surgery

## 2015-09-17 ENCOUNTER — Encounter: Payer: Self-pay | Admitting: Vascular Surgery

## 2015-09-17 ENCOUNTER — Ambulatory Visit (HOSPITAL_COMMUNITY)
Admission: RE | Admit: 2015-09-17 | Discharge: 2015-09-17 | Disposition: A | Discharge status: Home / Self Care | Payer: Commercial Managed Care - HMO | Source: Ambulatory Visit | Attending: Vascular Surgery | Admitting: Vascular Surgery

## 2015-09-17 VITALS — BP 113/76 | HR 64 | Ht 66.0 in | Wt 171.0 lb

## 2015-09-17 DIAGNOSIS — I890 Lymphedema, not elsewhere classified: Secondary | ICD-10-CM | POA: Diagnosis not present

## 2015-09-17 DIAGNOSIS — R609 Edema, unspecified: Secondary | ICD-10-CM

## 2015-09-17 DIAGNOSIS — I872 Venous insufficiency (chronic) (peripheral): Secondary | ICD-10-CM

## 2015-09-17 NOTE — Progress Notes (Signed)
[facility] [place]  Patient name: [name] MRN: [id number] DOB: 1961-04-15 Sex: female  REASON FOR CONSULT: Bilateral lower extremity edema  HPI: [name] is a 54 y.o. female, who is who was referred with bilateral lower extremity edema which she has had for several months. She has had the gradual onset of swelling in both legs but most significantly on the left side. She denies any history of DVT or phlebitis. She complains of aching pain swelling and heaviness in the left leg especially. She's had no previous abdominal or groin surgery or radiation therapy. She has had previous knee surgery on the left several times and also bilateral hip replacements.  I have reviewed the records that were sent with her from [place]. She has undergone bilateral hip replacements.  Past Medical History  Diagnosis Date  . Alcoholic pancreatitis 24/0973    01/15/13  . Chronic pain     treated at [facility]   . Depression   . Anxiety   . Anemia     Reports being borderline  . Fibromyalgia   . History of blood transfusion     "related to ORs"  . Migraine     "monthly" (03/25/2015)  . Osteoarthritis     "wrists, knees, back, neck, fingers,  q joint" (03/25/2015)  . Chronic lower back pain     Family History  Problem Relation Age of Onset  . Cancer Mother   . Cancer Sister     SOCIAL HISTORY: Social History   Social History  . Marital Status: Divorced    Spouse Name: Gladiola Madore   . Number of Children: 2  . Years of Education: 12+   Occupational History  . Disabled    Social History Main Topics  . Smoking status: Current Every Day Smoker -- 1.00 packs/day for 35 years    Types: Cigarettes  . Smokeless tobacco: Never Used  . Alcohol Use: No     Comment: 03/25/2015 "not since I had pancreatitis"  . Drug Use: No  . Sexual Activity: Yes   Other Topics Concern  . Not on file   Social History Narrative   Patient is single.    Patient has 2 children.    Patient is disabled.    Patient has a college education.     Allergies  Allergen Reactions  . Morphine And Related Itching  . Vimovo [Naproxen-Esomeprazole] Swelling    Current Outpatient Prescriptions  Medication Sig Dispense Refill  . amitriptyline (ELAVIL) 10 MG tablet Take 1.5 tablets (15 mg total) by mouth at bedtime. 45 tablet 6  . baclofen (LIORESAL) 10 MG tablet Take 10 mg by mouth 2 (two) times daily.    Marland Kitchen buPROPion (WELLBUTRIN XL) 300 MG 24 hr tablet Take 300 mg by mouth daily.    . Calcium Carbonate-Vit D-Min (CALCIUM/VITAMIN D/MINERALS) 600-200 MG-UNIT TABS Take 1 tablet by mouth daily.     . Cyanocobalamin (VITAMIN B 12 PO) Take 500 mcg by mouth.    . docusate sodium (COLACE) 100 MG capsule Take 1 capsule (100 mg total) by mouth 2 (two) times daily. 10 capsule 0  . fentaNYL (DURAGESIC - DOSED MCG/HR) 50 MCG/HR Place 50 mcg onto the skin every other day.     . gabapentin (NEURONTIN) 300 MG capsule Take 600-1,200 mg by mouth 3 (three) times daily. 2 TAB IN THE MORNING, 2 TAB AT LUNCH, 4 TABS AT BEDTIME    . HYDROmorphone (DILAUDID) 2 MG  tablet Take 2 mg by mouth every 6 (six) hours as needed for severe pain.    . meclizine (ANTIVERT) 25 MG tablet Take 25 mg by mouth 3 (three) times daily as needed for dizziness.    . meloxicam (MOBIC) 15 MG tablet Take 1 tablet (15 mg total) by mouth daily. 30 tablet 1  . Multiple Vitamins-Minerals (MULTIVITAMIN PO) Take 1 tablet by mouth daily.     . Omega-3 Fatty Acids (FISH OIL) 1000 MG CAPS Take 1,000 mg by mouth daily.     . ondansetron (ZOFRAN) 4 MG tablet Take 1 tablet (4 mg total) by mouth every 8 (eight) hours as needed for nausea or vomiting. 20 tablet 0  . SUMAtriptan (IMITREX) 100 MG tablet Take 100 mg by mouth every 2 (two) hours as needed for migraine.     Marland Kitchen aspirin 325 MG tablet Take 1 tablet (325 mg total) by mouth daily. (Patient not taking: Reported on 09/17/2015) 30 tablet 0  . oxyCODONE  (ROXICODONE) 5 MG immediate release tablet Take 1 tablet (5 mg total) by mouth every 4 (four) hours as needed for severe pain. (Patient not taking: Reported on 09/17/2015) 60 tablet 0  . oxyCODONE-acetaminophen (PERCOCET) 5-325 MG per tablet Take 1-2 tablets by mouth every 4 (four) hours as needed for severe pain. (Patient not taking: Reported on 09/17/2015) 90 tablet 0   No current facility-administered medications for this visit.    REVIEW OF SYSTEMS:  [X]  denotes positive finding, [ ]  denotes negative finding Cardiac  Comments:  Chest pain or chest pressure:    Shortness of breath upon exertion:    Short of breath when lying flat:    Irregular heart rhythm:        Vascular    Pain in calf, thigh, or hip brought on by ambulation:    Pain in feet at night that wakes you up from your sleep:     Blood clot in your veins:    Leg swelling:  X       Pulmonary    Oxygen at home:    Productive cough:     Wheezing:         Neurologic    Sudden weakness in arms or legs:     Sudden numbness in arms or legs:     Sudden onset of difficulty speaking or slurred speech:    Temporary loss of vision in one eye:     Problems with dizziness:         Gastrointestinal    Blood in stool:     Vomited blood:         Genitourinary    Burning when urinating:     Blood in urine:        Psychiatric    Major depression:         Hematologic    Bleeding problems:    Problems with blood clotting too easily:        Skin    Rashes or ulcers:        Constitutional    Fever or chills:      PHYSICAL EXAM: Filed Vitals:   09/17/15 1314  BP: 113/76  Pulse: 64  Height: 5\' 6"  (1.676 m)  Weight: 171 lb (77.565 kg)  SpO2: 100%    GENERAL: The patient is a well-nourished female, in no acute distress. The vital signs are documented above. CARDIAC: There is a regular rate and rhythm.  VASCULAR: I do not detect carotid bruits.  She has palpable dorsalis pedis pulses bilaterally. She has mild right  lower extremity swelling and moderate left lower extremity swelling. PULMONARY: There is good air exchange bilaterally without wheezing or rales. ABDOMEN: Soft and non-tender with normal pitched bowel sounds.  MUSCULOSKELETAL: There are no major deformities or cyanosis. NEUROLOGIC: No focal weakness or paresthesias are detected. SKIN: There are no ulcers or rashes noted. He has no varicose veins. PSYCHIATRIC: The patient has a normal affect.  DATA:   LOWER EXTREMITY VENOUS DUPLEX: I have independently interpreted her lower extremity venous duplex scan.  On the right side, there is no evidence of DVT. There is reflux in the right saphenofemoral junction but no reflux in the deep system or in the great saphenous vein.  On the left side there is no evidence of DVT. There is some deep vein reflux on the left and the common femoral vein and femoral vein. There is no reflux in the great saphenous vein.  MEDICAL ISSUES:  LEFT LOWER EXTREMITY SWELLING: She has only mild right lower extremity swelling with no DVT and only mild reflux in the saphenofemoral junction on the right. She has significant swelling on the left which I think is related to 2 things. First she has chronic venous insufficiency on the left related to reflux in the deep veins. In addition because of the previous surgery on her left knee and left hip I think she does have a component of lymphedema. Regardless, I have explained that the treatment for these are the same. We have discussed the importance of intermittent leg elevation in the proper positioning for this. In addition I have written her a prescription for knee-high compression stockings with a gradient of 15-20 mmHg.  I have encouraged her to avoid prolonged sitting and standing. I encouraged her to exercise and also to consider water aerobics which is helpful for people with chronic venous insufficiency and lymphedema. I will see her back any time if her symptoms progress or any  new vascular issues arise.   [name] [facility] [place]: [phone]

## 2015-09-29 DIAGNOSIS — G894 Chronic pain syndrome: Secondary | ICD-10-CM | POA: Diagnosis not present

## 2015-09-29 DIAGNOSIS — M25561 Pain in right knee: Secondary | ICD-10-CM | POA: Diagnosis not present

## 2015-09-29 DIAGNOSIS — M79641 Pain in right hand: Secondary | ICD-10-CM | POA: Diagnosis not present

## 2015-09-29 DIAGNOSIS — M79672 Pain in left foot: Secondary | ICD-10-CM | POA: Diagnosis not present

## 2015-10-03 DIAGNOSIS — I890 Lymphedema, not elsewhere classified: Secondary | ICD-10-CM | POA: Diagnosis not present

## 2015-10-03 DIAGNOSIS — F329 Major depressive disorder, single episode, unspecified: Secondary | ICD-10-CM | POA: Diagnosis not present

## 2015-10-27 DIAGNOSIS — M25561 Pain in right knee: Secondary | ICD-10-CM | POA: Diagnosis not present

## 2015-10-27 DIAGNOSIS — M79672 Pain in left foot: Secondary | ICD-10-CM | POA: Diagnosis not present

## 2015-10-27 DIAGNOSIS — G894 Chronic pain syndrome: Secondary | ICD-10-CM | POA: Diagnosis not present

## 2015-10-27 DIAGNOSIS — M79641 Pain in right hand: Secondary | ICD-10-CM | POA: Diagnosis not present

## 2015-11-27 DIAGNOSIS — Z5181 Encounter for therapeutic drug level monitoring: Secondary | ICD-10-CM | POA: Diagnosis not present

## 2015-11-27 DIAGNOSIS — G8929 Other chronic pain: Secondary | ICD-10-CM | POA: Diagnosis not present

## 2015-11-27 DIAGNOSIS — M79672 Pain in left foot: Secondary | ICD-10-CM | POA: Diagnosis not present

## 2015-11-27 DIAGNOSIS — Z79899 Other long term (current) drug therapy: Secondary | ICD-10-CM | POA: Diagnosis not present

## 2015-11-27 DIAGNOSIS — M25561 Pain in right knee: Secondary | ICD-10-CM | POA: Diagnosis not present

## 2015-11-27 DIAGNOSIS — G894 Chronic pain syndrome: Secondary | ICD-10-CM | POA: Diagnosis not present

## 2015-12-22 DIAGNOSIS — Z791 Long term (current) use of non-steroidal anti-inflammatories (NSAID): Secondary | ICD-10-CM | POA: Diagnosis not present

## 2015-12-22 DIAGNOSIS — G894 Chronic pain syndrome: Secondary | ICD-10-CM | POA: Diagnosis not present

## 2015-12-22 DIAGNOSIS — M1711 Unilateral primary osteoarthritis, right knee: Secondary | ICD-10-CM | POA: Diagnosis not present

## 2015-12-22 DIAGNOSIS — Z885 Allergy status to narcotic agent status: Secondary | ICD-10-CM | POA: Diagnosis not present

## 2015-12-22 DIAGNOSIS — M25561 Pain in right knee: Secondary | ICD-10-CM | POA: Diagnosis not present

## 2015-12-22 DIAGNOSIS — Z79899 Other long term (current) drug therapy: Secondary | ICD-10-CM | POA: Diagnosis not present

## 2015-12-22 DIAGNOSIS — F17200 Nicotine dependence, unspecified, uncomplicated: Secondary | ICD-10-CM | POA: Diagnosis not present

## 2015-12-22 DIAGNOSIS — Z886 Allergy status to analgesic agent status: Secondary | ICD-10-CM | POA: Diagnosis not present

## 2015-12-25 DIAGNOSIS — M545 Low back pain: Secondary | ICD-10-CM | POA: Diagnosis not present

## 2015-12-25 DIAGNOSIS — G894 Chronic pain syndrome: Secondary | ICD-10-CM | POA: Diagnosis not present

## 2015-12-25 DIAGNOSIS — M25561 Pain in right knee: Secondary | ICD-10-CM | POA: Diagnosis not present

## 2015-12-25 DIAGNOSIS — G8929 Other chronic pain: Secondary | ICD-10-CM | POA: Diagnosis not present

## 2016-01-13 DIAGNOSIS — G5622 Lesion of ulnar nerve, left upper limb: Secondary | ICD-10-CM | POA: Diagnosis not present

## 2016-01-13 DIAGNOSIS — M25522 Pain in left elbow: Secondary | ICD-10-CM | POA: Diagnosis not present

## 2016-01-29 DIAGNOSIS — G5622 Lesion of ulnar nerve, left upper limb: Secondary | ICD-10-CM | POA: Diagnosis not present

## 2016-02-18 DIAGNOSIS — M25561 Pain in right knee: Secondary | ICD-10-CM | POA: Diagnosis not present

## 2016-02-24 DIAGNOSIS — M25522 Pain in left elbow: Secondary | ICD-10-CM | POA: Diagnosis not present

## 2016-02-24 DIAGNOSIS — G8929 Other chronic pain: Secondary | ICD-10-CM | POA: Diagnosis not present

## 2016-02-24 DIAGNOSIS — G5622 Lesion of ulnar nerve, left upper limb: Secondary | ICD-10-CM | POA: Diagnosis not present

## 2016-03-01 DIAGNOSIS — M79641 Pain in right hand: Secondary | ICD-10-CM | POA: Diagnosis not present

## 2016-03-01 DIAGNOSIS — M25561 Pain in right knee: Secondary | ICD-10-CM | POA: Diagnosis not present

## 2016-03-01 DIAGNOSIS — G894 Chronic pain syndrome: Secondary | ICD-10-CM | POA: Diagnosis not present

## 2016-03-01 DIAGNOSIS — M545 Low back pain: Secondary | ICD-10-CM | POA: Diagnosis not present

## 2016-03-15 DIAGNOSIS — G5622 Lesion of ulnar nerve, left upper limb: Secondary | ICD-10-CM | POA: Diagnosis not present

## 2016-03-29 DIAGNOSIS — I890 Lymphedema, not elsewhere classified: Secondary | ICD-10-CM | POA: Diagnosis not present

## 2016-03-29 DIAGNOSIS — D649 Anemia, unspecified: Secondary | ICD-10-CM | POA: Diagnosis not present

## 2016-03-29 DIAGNOSIS — F329 Major depressive disorder, single episode, unspecified: Secondary | ICD-10-CM | POA: Diagnosis not present

## 2016-03-29 DIAGNOSIS — G894 Chronic pain syndrome: Secondary | ICD-10-CM | POA: Diagnosis not present

## 2016-03-29 DIAGNOSIS — Z791 Long term (current) use of non-steroidal anti-inflammatories (NSAID): Secondary | ICD-10-CM | POA: Diagnosis not present

## 2016-03-29 DIAGNOSIS — E538 Deficiency of other specified B group vitamins: Secondary | ICD-10-CM | POA: Diagnosis not present

## 2016-04-12 DIAGNOSIS — M545 Low back pain: Secondary | ICD-10-CM | POA: Diagnosis not present

## 2016-04-12 DIAGNOSIS — Z79899 Other long term (current) drug therapy: Secondary | ICD-10-CM | POA: Diagnosis not present

## 2016-04-12 DIAGNOSIS — G894 Chronic pain syndrome: Secondary | ICD-10-CM | POA: Diagnosis not present

## 2016-04-12 DIAGNOSIS — Z5181 Encounter for therapeutic drug level monitoring: Secondary | ICD-10-CM | POA: Diagnosis not present

## 2016-04-12 DIAGNOSIS — M79671 Pain in right foot: Secondary | ICD-10-CM | POA: Diagnosis not present

## 2016-04-12 DIAGNOSIS — M25561 Pain in right knee: Secondary | ICD-10-CM | POA: Diagnosis not present

## 2016-04-23 ENCOUNTER — Other Ambulatory Visit (HOSPITAL_BASED_OUTPATIENT_CLINIC_OR_DEPARTMENT_OTHER): Payer: Self-pay | Admitting: Physician Assistant

## 2016-04-23 DIAGNOSIS — Z1231 Encounter for screening mammogram for malignant neoplasm of breast: Secondary | ICD-10-CM

## 2016-04-26 ENCOUNTER — Encounter: Payer: Self-pay | Admitting: Vascular Surgery

## 2016-04-29 ENCOUNTER — Ambulatory Visit (HOSPITAL_BASED_OUTPATIENT_CLINIC_OR_DEPARTMENT_OTHER)
Admission: RE | Admit: 2016-04-29 | Discharge: 2016-04-29 | Disposition: A | Discharge status: Home / Self Care | Payer: Commercial Managed Care - HMO | Source: Ambulatory Visit | Attending: Physician Assistant | Admitting: Physician Assistant

## 2016-04-29 DIAGNOSIS — Z1231 Encounter for screening mammogram for malignant neoplasm of breast: Secondary | ICD-10-CM | POA: Insufficient documentation

## 2016-05-28 DIAGNOSIS — Z0100 Encounter for examination of eyes and vision without abnormal findings: Secondary | ICD-10-CM | POA: Diagnosis not present

## 2016-06-02 DIAGNOSIS — M25561 Pain in right knee: Secondary | ICD-10-CM | POA: Diagnosis not present

## 2016-06-02 DIAGNOSIS — G894 Chronic pain syndrome: Secondary | ICD-10-CM | POA: Diagnosis not present

## 2016-06-02 DIAGNOSIS — M79671 Pain in right foot: Secondary | ICD-10-CM | POA: Diagnosis not present

## 2016-06-02 DIAGNOSIS — M79641 Pain in right hand: Secondary | ICD-10-CM | POA: Diagnosis not present

## 2016-07-05 DIAGNOSIS — M545 Low back pain: Secondary | ICD-10-CM | POA: Diagnosis not present

## 2016-07-05 DIAGNOSIS — G894 Chronic pain syndrome: Secondary | ICD-10-CM | POA: Diagnosis not present

## 2016-07-05 DIAGNOSIS — M79641 Pain in right hand: Secondary | ICD-10-CM | POA: Diagnosis not present

## 2016-07-05 DIAGNOSIS — M25561 Pain in right knee: Secondary | ICD-10-CM | POA: Diagnosis not present

## 2016-08-05 DIAGNOSIS — Z5181 Encounter for therapeutic drug level monitoring: Secondary | ICD-10-CM | POA: Diagnosis not present

## 2016-08-05 DIAGNOSIS — M79641 Pain in right hand: Secondary | ICD-10-CM | POA: Diagnosis not present

## 2016-08-05 DIAGNOSIS — M79672 Pain in left foot: Secondary | ICD-10-CM | POA: Diagnosis not present

## 2016-08-05 DIAGNOSIS — Z79899 Other long term (current) drug therapy: Secondary | ICD-10-CM | POA: Diagnosis not present

## 2016-08-05 DIAGNOSIS — M25561 Pain in right knee: Secondary | ICD-10-CM | POA: Diagnosis not present

## 2016-08-05 DIAGNOSIS — G8929 Other chronic pain: Secondary | ICD-10-CM | POA: Diagnosis not present

## 2016-08-05 DIAGNOSIS — M545 Low back pain: Secondary | ICD-10-CM | POA: Diagnosis not present

## 2016-08-30 DIAGNOSIS — M79672 Pain in left foot: Secondary | ICD-10-CM | POA: Diagnosis not present

## 2016-08-30 DIAGNOSIS — G894 Chronic pain syndrome: Secondary | ICD-10-CM | POA: Diagnosis not present

## 2016-08-30 DIAGNOSIS — M545 Low back pain: Secondary | ICD-10-CM | POA: Diagnosis not present

## 2016-08-30 DIAGNOSIS — M25561 Pain in right knee: Secondary | ICD-10-CM | POA: Diagnosis not present

## 2016-09-23 DIAGNOSIS — G894 Chronic pain syndrome: Secondary | ICD-10-CM | POA: Diagnosis not present

## 2016-09-23 DIAGNOSIS — M47812 Spondylosis without myelopathy or radiculopathy, cervical region: Secondary | ICD-10-CM | POA: Diagnosis not present

## 2016-09-23 DIAGNOSIS — G8929 Other chronic pain: Secondary | ICD-10-CM | POA: Diagnosis not present

## 2016-09-23 DIAGNOSIS — M25561 Pain in right knee: Secondary | ICD-10-CM | POA: Diagnosis not present

## 2016-10-01 DIAGNOSIS — F329 Major depressive disorder, single episode, unspecified: Secondary | ICD-10-CM | POA: Diagnosis not present

## 2016-10-01 DIAGNOSIS — I890 Lymphedema, not elsewhere classified: Secondary | ICD-10-CM | POA: Diagnosis not present

## 2016-10-11 DIAGNOSIS — M25561 Pain in right knee: Secondary | ICD-10-CM | POA: Diagnosis not present

## 2016-10-11 DIAGNOSIS — G894 Chronic pain syndrome: Secondary | ICD-10-CM | POA: Diagnosis not present

## 2016-10-11 DIAGNOSIS — M1711 Unilateral primary osteoarthritis, right knee: Secondary | ICD-10-CM | POA: Diagnosis not present

## 2016-10-11 DIAGNOSIS — Z885 Allergy status to narcotic agent status: Secondary | ICD-10-CM | POA: Diagnosis not present

## 2016-10-11 DIAGNOSIS — Z886 Allergy status to analgesic agent status: Secondary | ICD-10-CM | POA: Diagnosis not present

## 2016-10-11 DIAGNOSIS — F17200 Nicotine dependence, unspecified, uncomplicated: Secondary | ICD-10-CM | POA: Diagnosis not present

## 2016-10-11 DIAGNOSIS — M179 Osteoarthritis of knee, unspecified: Secondary | ICD-10-CM | POA: Diagnosis not present

## 2016-10-21 DIAGNOSIS — M545 Low back pain: Secondary | ICD-10-CM | POA: Diagnosis not present

## 2016-10-21 DIAGNOSIS — M47812 Spondylosis without myelopathy or radiculopathy, cervical region: Secondary | ICD-10-CM | POA: Diagnosis not present

## 2016-10-21 DIAGNOSIS — M25561 Pain in right knee: Secondary | ICD-10-CM | POA: Diagnosis not present

## 2016-10-21 DIAGNOSIS — G894 Chronic pain syndrome: Secondary | ICD-10-CM | POA: Diagnosis not present

## 2016-10-25 DIAGNOSIS — Z791 Long term (current) use of non-steroidal anti-inflammatories (NSAID): Secondary | ICD-10-CM | POA: Diagnosis not present

## 2016-10-25 DIAGNOSIS — Z79899 Other long term (current) drug therapy: Secondary | ICD-10-CM | POA: Diagnosis not present

## 2016-10-25 DIAGNOSIS — Z885 Allergy status to narcotic agent status: Secondary | ICD-10-CM | POA: Diagnosis not present

## 2016-10-25 DIAGNOSIS — Z886 Allergy status to analgesic agent status: Secondary | ICD-10-CM | POA: Diagnosis not present

## 2016-10-25 DIAGNOSIS — M1288 Other specific arthropathies, not elsewhere classified, other specified site: Secondary | ICD-10-CM | POA: Diagnosis not present

## 2016-10-25 DIAGNOSIS — F17200 Nicotine dependence, unspecified, uncomplicated: Secondary | ICD-10-CM | POA: Diagnosis not present

## 2016-10-25 DIAGNOSIS — G894 Chronic pain syndrome: Secondary | ICD-10-CM | POA: Diagnosis not present

## 2016-10-25 DIAGNOSIS — M47812 Spondylosis without myelopathy or radiculopathy, cervical region: Secondary | ICD-10-CM | POA: Diagnosis not present

## 2016-11-23 DIAGNOSIS — G894 Chronic pain syndrome: Secondary | ICD-10-CM | POA: Diagnosis not present

## 2016-11-23 DIAGNOSIS — M47812 Spondylosis without myelopathy or radiculopathy, cervical region: Secondary | ICD-10-CM | POA: Diagnosis not present

## 2016-11-23 DIAGNOSIS — Z79899 Other long term (current) drug therapy: Secondary | ICD-10-CM | POA: Diagnosis not present

## 2016-11-23 DIAGNOSIS — Z5181 Encounter for therapeutic drug level monitoring: Secondary | ICD-10-CM | POA: Diagnosis not present

## 2016-11-29 DIAGNOSIS — F17200 Nicotine dependence, unspecified, uncomplicated: Secondary | ICD-10-CM | POA: Diagnosis not present

## 2016-11-29 DIAGNOSIS — M47812 Spondylosis without myelopathy or radiculopathy, cervical region: Secondary | ICD-10-CM | POA: Diagnosis not present

## 2016-11-29 DIAGNOSIS — Z885 Allergy status to narcotic agent status: Secondary | ICD-10-CM | POA: Diagnosis not present

## 2016-12-14 DIAGNOSIS — M47812 Spondylosis without myelopathy or radiculopathy, cervical region: Secondary | ICD-10-CM | POA: Diagnosis not present

## 2016-12-14 DIAGNOSIS — G894 Chronic pain syndrome: Secondary | ICD-10-CM | POA: Diagnosis not present

## 2016-12-27 DIAGNOSIS — Z885 Allergy status to narcotic agent status: Secondary | ICD-10-CM | POA: Diagnosis not present

## 2016-12-27 DIAGNOSIS — G894 Chronic pain syndrome: Secondary | ICD-10-CM | POA: Diagnosis not present

## 2016-12-27 DIAGNOSIS — F17200 Nicotine dependence, unspecified, uncomplicated: Secondary | ICD-10-CM | POA: Diagnosis not present

## 2016-12-27 DIAGNOSIS — Z888 Allergy status to other drugs, medicaments and biological substances status: Secondary | ICD-10-CM | POA: Diagnosis not present

## 2016-12-27 DIAGNOSIS — Z79899 Other long term (current) drug therapy: Secondary | ICD-10-CM | POA: Diagnosis not present

## 2016-12-27 DIAGNOSIS — M47812 Spondylosis without myelopathy or radiculopathy, cervical region: Secondary | ICD-10-CM | POA: Diagnosis not present

## 2017-01-05 DIAGNOSIS — Z96641 Presence of right artificial hip joint: Secondary | ICD-10-CM | POA: Diagnosis not present

## 2017-01-05 DIAGNOSIS — M545 Low back pain: Secondary | ICD-10-CM | POA: Diagnosis not present

## 2017-01-06 ENCOUNTER — Other Ambulatory Visit: Payer: Self-pay | Admitting: Orthopedic Surgery

## 2017-01-06 DIAGNOSIS — M25551 Pain in right hip: Secondary | ICD-10-CM

## 2017-01-11 DIAGNOSIS — M25551 Pain in right hip: Secondary | ICD-10-CM | POA: Diagnosis not present

## 2017-01-11 DIAGNOSIS — M47812 Spondylosis without myelopathy or radiculopathy, cervical region: Secondary | ICD-10-CM | POA: Diagnosis not present

## 2017-01-11 DIAGNOSIS — M25552 Pain in left hip: Secondary | ICD-10-CM | POA: Diagnosis not present

## 2017-01-11 DIAGNOSIS — G894 Chronic pain syndrome: Secondary | ICD-10-CM | POA: Diagnosis not present

## 2017-01-12 ENCOUNTER — Ambulatory Visit
Admission: RE | Admit: 2017-01-12 | Discharge: 2017-01-12 | Disposition: A | Discharge status: Home / Self Care | Payer: Medicare HMO | Source: Ambulatory Visit | Attending: Orthopedic Surgery | Admitting: Orthopedic Surgery

## 2017-01-12 DIAGNOSIS — M25551 Pain in right hip: Secondary | ICD-10-CM | POA: Diagnosis not present

## 2017-01-12 MED ORDER — IOPAMIDOL (ISOVUE-M 200) INJECTION 41%
1.0000 mL | Freq: Once | INTRAMUSCULAR | Status: AC
  Administered 2017-01-12: 1 mL via INTRA_ARTICULAR

## 2017-01-12 MED ORDER — METHYLPREDNISOLONE ACETATE 40 MG/ML INJ SUSP (RADIOLOG
120.0000 mg | Freq: Once | INTRAMUSCULAR | Status: AC
  Administered 2017-01-12: 120 mg via INTRA_ARTICULAR

## 2017-01-24 DIAGNOSIS — M47812 Spondylosis without myelopathy or radiculopathy, cervical region: Secondary | ICD-10-CM | POA: Diagnosis not present

## 2017-01-24 DIAGNOSIS — F17210 Nicotine dependence, cigarettes, uncomplicated: Secondary | ICD-10-CM | POA: Diagnosis not present

## 2017-02-08 DIAGNOSIS — G894 Chronic pain syndrome: Secondary | ICD-10-CM | POA: Diagnosis not present

## 2017-02-08 DIAGNOSIS — M25551 Pain in right hip: Secondary | ICD-10-CM | POA: Diagnosis not present

## 2017-02-08 DIAGNOSIS — M25561 Pain in right knee: Secondary | ICD-10-CM | POA: Diagnosis not present

## 2017-02-08 DIAGNOSIS — M47812 Spondylosis without myelopathy or radiculopathy, cervical region: Secondary | ICD-10-CM | POA: Diagnosis not present

## 2017-02-21 DIAGNOSIS — G43009 Migraine without aura, not intractable, without status migrainosus: Secondary | ICD-10-CM | POA: Diagnosis not present

## 2017-03-08 DIAGNOSIS — G894 Chronic pain syndrome: Secondary | ICD-10-CM | POA: Diagnosis not present

## 2017-03-08 DIAGNOSIS — M47812 Spondylosis without myelopathy or radiculopathy, cervical region: Secondary | ICD-10-CM | POA: Diagnosis not present

## 2017-03-08 DIAGNOSIS — M25551 Pain in right hip: Secondary | ICD-10-CM | POA: Diagnosis not present

## 2017-03-08 DIAGNOSIS — Z5181 Encounter for therapeutic drug level monitoring: Secondary | ICD-10-CM | POA: Diagnosis not present

## 2017-03-08 DIAGNOSIS — M79672 Pain in left foot: Secondary | ICD-10-CM | POA: Diagnosis not present

## 2017-03-08 DIAGNOSIS — Z79899 Other long term (current) drug therapy: Secondary | ICD-10-CM | POA: Diagnosis not present

## 2017-03-22 DIAGNOSIS — M79641 Pain in right hand: Secondary | ICD-10-CM | POA: Diagnosis not present

## 2017-03-22 DIAGNOSIS — G894 Chronic pain syndrome: Secondary | ICD-10-CM | POA: Diagnosis not present

## 2017-03-22 DIAGNOSIS — M25561 Pain in right knee: Secondary | ICD-10-CM | POA: Diagnosis not present

## 2017-03-22 DIAGNOSIS — M79672 Pain in left foot: Secondary | ICD-10-CM | POA: Diagnosis not present

## 2017-04-01 ENCOUNTER — Other Ambulatory Visit: Payer: Self-pay | Admitting: Orthopedic Surgery

## 2017-04-01 DIAGNOSIS — R1031 Right lower quadrant pain: Secondary | ICD-10-CM

## 2017-04-01 DIAGNOSIS — M25551 Pain in right hip: Secondary | ICD-10-CM

## 2017-04-13 DIAGNOSIS — M25561 Pain in right knee: Secondary | ICD-10-CM | POA: Diagnosis not present

## 2017-04-13 DIAGNOSIS — M79672 Pain in left foot: Secondary | ICD-10-CM | POA: Diagnosis not present

## 2017-04-13 DIAGNOSIS — M79641 Pain in right hand: Secondary | ICD-10-CM | POA: Diagnosis not present

## 2017-04-13 DIAGNOSIS — G894 Chronic pain syndrome: Secondary | ICD-10-CM | POA: Diagnosis not present

## 2017-05-11 DIAGNOSIS — G894 Chronic pain syndrome: Secondary | ICD-10-CM | POA: Diagnosis not present

## 2017-05-11 DIAGNOSIS — M79641 Pain in right hand: Secondary | ICD-10-CM | POA: Diagnosis not present

## 2017-05-11 DIAGNOSIS — M79672 Pain in left foot: Secondary | ICD-10-CM | POA: Diagnosis not present

## 2017-05-11 DIAGNOSIS — M25561 Pain in right knee: Secondary | ICD-10-CM | POA: Diagnosis not present

## 2017-05-23 DIAGNOSIS — L301 Dyshidrosis [pompholyx]: Secondary | ICD-10-CM | POA: Diagnosis not present

## 2017-05-23 DIAGNOSIS — B351 Tinea unguium: Secondary | ICD-10-CM | POA: Diagnosis not present

## 2017-05-30 DIAGNOSIS — Z0100 Encounter for examination of eyes and vision without abnormal findings: Secondary | ICD-10-CM | POA: Diagnosis not present

## 2017-06-01 DIAGNOSIS — M25561 Pain in right knee: Secondary | ICD-10-CM | POA: Diagnosis not present

## 2017-06-01 DIAGNOSIS — Z5181 Encounter for therapeutic drug level monitoring: Secondary | ICD-10-CM | POA: Diagnosis not present

## 2017-06-01 DIAGNOSIS — M79641 Pain in right hand: Secondary | ICD-10-CM | POA: Diagnosis not present

## 2017-06-01 DIAGNOSIS — Z79899 Other long term (current) drug therapy: Secondary | ICD-10-CM | POA: Diagnosis not present

## 2017-06-01 DIAGNOSIS — M79672 Pain in left foot: Secondary | ICD-10-CM | POA: Diagnosis not present

## 2017-06-01 DIAGNOSIS — G894 Chronic pain syndrome: Secondary | ICD-10-CM | POA: Diagnosis not present

## 2017-07-05 DIAGNOSIS — G894 Chronic pain syndrome: Secondary | ICD-10-CM | POA: Diagnosis not present

## 2017-08-02 DIAGNOSIS — M545 Low back pain: Secondary | ICD-10-CM | POA: Diagnosis not present

## 2017-08-02 DIAGNOSIS — M79641 Pain in right hand: Secondary | ICD-10-CM | POA: Diagnosis not present

## 2017-08-02 DIAGNOSIS — M25561 Pain in right knee: Secondary | ICD-10-CM | POA: Diagnosis not present

## 2017-08-02 DIAGNOSIS — G894 Chronic pain syndrome: Secondary | ICD-10-CM | POA: Diagnosis not present

## 2017-09-08 DIAGNOSIS — G894 Chronic pain syndrome: Secondary | ICD-10-CM | POA: Diagnosis not present

## 2017-09-08 DIAGNOSIS — Z79899 Other long term (current) drug therapy: Secondary | ICD-10-CM | POA: Diagnosis not present

## 2017-09-08 DIAGNOSIS — M25561 Pain in right knee: Secondary | ICD-10-CM | POA: Diagnosis not present

## 2017-09-08 DIAGNOSIS — M79641 Pain in right hand: Secondary | ICD-10-CM | POA: Diagnosis not present

## 2017-09-08 DIAGNOSIS — Z5181 Encounter for therapeutic drug level monitoring: Secondary | ICD-10-CM | POA: Diagnosis not present

## 2017-09-08 DIAGNOSIS — M545 Low back pain: Secondary | ICD-10-CM | POA: Diagnosis not present

## 2017-10-06 DIAGNOSIS — G894 Chronic pain syndrome: Secondary | ICD-10-CM | POA: Diagnosis not present

## 2017-10-06 DIAGNOSIS — M25561 Pain in right knee: Secondary | ICD-10-CM | POA: Diagnosis not present

## 2017-10-06 DIAGNOSIS — M79642 Pain in left hand: Secondary | ICD-10-CM | POA: Diagnosis not present

## 2017-10-06 DIAGNOSIS — M79641 Pain in right hand: Secondary | ICD-10-CM | POA: Diagnosis not present

## 2017-11-24 DIAGNOSIS — M545 Low back pain: Secondary | ICD-10-CM | POA: Diagnosis not present

## 2017-11-24 DIAGNOSIS — M25561 Pain in right knee: Secondary | ICD-10-CM | POA: Diagnosis not present

## 2017-11-24 DIAGNOSIS — M79641 Pain in right hand: Secondary | ICD-10-CM | POA: Diagnosis not present

## 2017-11-24 DIAGNOSIS — G894 Chronic pain syndrome: Secondary | ICD-10-CM | POA: Diagnosis not present

## 2017-12-19 DIAGNOSIS — M25562 Pain in left knee: Secondary | ICD-10-CM | POA: Diagnosis not present

## 2017-12-19 DIAGNOSIS — M25551 Pain in right hip: Secondary | ICD-10-CM | POA: Diagnosis not present

## 2017-12-19 DIAGNOSIS — G8929 Other chronic pain: Secondary | ICD-10-CM | POA: Diagnosis not present

## 2017-12-19 DIAGNOSIS — M48061 Spinal stenosis, lumbar region without neurogenic claudication: Secondary | ICD-10-CM | POA: Diagnosis not present

## 2017-12-19 DIAGNOSIS — G894 Chronic pain syndrome: Secondary | ICD-10-CM | POA: Diagnosis not present

## 2017-12-19 DIAGNOSIS — M79672 Pain in left foot: Secondary | ICD-10-CM | POA: Diagnosis not present

## 2017-12-19 DIAGNOSIS — M25561 Pain in right knee: Secondary | ICD-10-CM | POA: Diagnosis not present

## 2017-12-19 DIAGNOSIS — M47812 Spondylosis without myelopathy or radiculopathy, cervical region: Secondary | ICD-10-CM | POA: Diagnosis not present

## 2017-12-19 DIAGNOSIS — M1711 Unilateral primary osteoarthritis, right knee: Secondary | ICD-10-CM | POA: Diagnosis not present

## 2017-12-19 DIAGNOSIS — M25552 Pain in left hip: Secondary | ICD-10-CM | POA: Diagnosis not present

## 2017-12-22 DIAGNOSIS — M25561 Pain in right knee: Secondary | ICD-10-CM | POA: Diagnosis not present

## 2017-12-22 DIAGNOSIS — Z5181 Encounter for therapeutic drug level monitoring: Secondary | ICD-10-CM | POA: Diagnosis not present

## 2017-12-22 DIAGNOSIS — Z79899 Other long term (current) drug therapy: Secondary | ICD-10-CM | POA: Diagnosis not present

## 2017-12-22 DIAGNOSIS — G894 Chronic pain syndrome: Secondary | ICD-10-CM | POA: Diagnosis not present

## 2017-12-22 DIAGNOSIS — M79641 Pain in right hand: Secondary | ICD-10-CM | POA: Diagnosis not present

## 2017-12-22 DIAGNOSIS — M545 Low back pain: Secondary | ICD-10-CM | POA: Diagnosis not present

## 2018-01-12 DIAGNOSIS — G894 Chronic pain syndrome: Secondary | ICD-10-CM | POA: Diagnosis not present

## 2018-01-12 DIAGNOSIS — M79641 Pain in right hand: Secondary | ICD-10-CM | POA: Diagnosis not present

## 2018-01-12 DIAGNOSIS — M25561 Pain in right knee: Secondary | ICD-10-CM | POA: Diagnosis not present

## 2018-01-12 DIAGNOSIS — M79672 Pain in left foot: Secondary | ICD-10-CM | POA: Diagnosis not present

## 2018-02-09 DIAGNOSIS — M79641 Pain in right hand: Secondary | ICD-10-CM | POA: Diagnosis not present

## 2018-02-09 DIAGNOSIS — G894 Chronic pain syndrome: Secondary | ICD-10-CM | POA: Diagnosis not present

## 2018-02-09 DIAGNOSIS — M79672 Pain in left foot: Secondary | ICD-10-CM | POA: Diagnosis not present

## 2018-02-09 DIAGNOSIS — M25561 Pain in right knee: Secondary | ICD-10-CM | POA: Diagnosis not present

## 2018-04-05 DIAGNOSIS — G894 Chronic pain syndrome: Secondary | ICD-10-CM | POA: Diagnosis not present

## 2018-04-05 DIAGNOSIS — F329 Major depressive disorder, single episode, unspecified: Secondary | ICD-10-CM | POA: Diagnosis not present

## 2018-04-05 DIAGNOSIS — E785 Hyperlipidemia, unspecified: Secondary | ICD-10-CM | POA: Diagnosis not present

## 2018-04-05 DIAGNOSIS — Z131 Encounter for screening for diabetes mellitus: Secondary | ICD-10-CM | POA: Diagnosis not present

## 2018-04-05 DIAGNOSIS — R6882 Decreased libido: Secondary | ICD-10-CM | POA: Diagnosis not present

## 2018-04-05 DIAGNOSIS — Z0000 Encounter for general adult medical examination without abnormal findings: Secondary | ICD-10-CM | POA: Diagnosis not present

## 2018-04-05 DIAGNOSIS — Z79899 Other long term (current) drug therapy: Secondary | ICD-10-CM | POA: Diagnosis not present

## 2018-04-06 DIAGNOSIS — Z5181 Encounter for therapeutic drug level monitoring: Secondary | ICD-10-CM | POA: Diagnosis not present

## 2018-04-06 DIAGNOSIS — G894 Chronic pain syndrome: Secondary | ICD-10-CM | POA: Diagnosis not present

## 2018-04-06 DIAGNOSIS — M79641 Pain in right hand: Secondary | ICD-10-CM | POA: Diagnosis not present

## 2018-04-06 DIAGNOSIS — M79672 Pain in left foot: Secondary | ICD-10-CM | POA: Diagnosis not present

## 2018-04-06 DIAGNOSIS — Z79899 Other long term (current) drug therapy: Secondary | ICD-10-CM | POA: Diagnosis not present

## 2018-04-06 DIAGNOSIS — M25561 Pain in right knee: Secondary | ICD-10-CM | POA: Diagnosis not present

## 2018-05-23 DIAGNOSIS — M545 Low back pain: Secondary | ICD-10-CM | POA: Diagnosis not present

## 2018-05-23 DIAGNOSIS — M1711 Unilateral primary osteoarthritis, right knee: Secondary | ICD-10-CM | POA: Diagnosis not present

## 2018-06-01 DIAGNOSIS — G894 Chronic pain syndrome: Secondary | ICD-10-CM | POA: Diagnosis not present

## 2018-06-01 DIAGNOSIS — M79641 Pain in right hand: Secondary | ICD-10-CM | POA: Diagnosis not present

## 2018-06-01 DIAGNOSIS — M25561 Pain in right knee: Secondary | ICD-10-CM | POA: Diagnosis not present

## 2018-06-01 DIAGNOSIS — M545 Low back pain: Secondary | ICD-10-CM | POA: Diagnosis not present

## 2018-06-01 DIAGNOSIS — M533 Sacrococcygeal disorders, not elsewhere classified: Secondary | ICD-10-CM | POA: Diagnosis not present

## 2018-07-27 DIAGNOSIS — M79672 Pain in left foot: Secondary | ICD-10-CM | POA: Diagnosis not present

## 2018-07-27 DIAGNOSIS — M79641 Pain in right hand: Secondary | ICD-10-CM | POA: Diagnosis not present

## 2018-07-27 DIAGNOSIS — G894 Chronic pain syndrome: Secondary | ICD-10-CM | POA: Diagnosis not present

## 2018-07-27 DIAGNOSIS — Z5181 Encounter for therapeutic drug level monitoring: Secondary | ICD-10-CM | POA: Diagnosis not present

## 2018-07-27 DIAGNOSIS — Z79899 Other long term (current) drug therapy: Secondary | ICD-10-CM | POA: Diagnosis not present

## 2018-07-27 DIAGNOSIS — M25561 Pain in right knee: Secondary | ICD-10-CM | POA: Diagnosis not present

## 2018-08-08 DIAGNOSIS — G894 Chronic pain syndrome: Secondary | ICD-10-CM | POA: Diagnosis not present

## 2018-08-08 DIAGNOSIS — M25861 Other specified joint disorders, right knee: Secondary | ICD-10-CM | POA: Diagnosis not present

## 2018-08-08 DIAGNOSIS — Z720 Tobacco use: Secondary | ICD-10-CM | POA: Diagnosis not present

## 2018-08-08 DIAGNOSIS — R001 Bradycardia, unspecified: Secondary | ICD-10-CM | POA: Diagnosis not present

## 2018-08-08 DIAGNOSIS — E785 Hyperlipidemia, unspecified: Secondary | ICD-10-CM | POA: Diagnosis not present

## 2018-08-08 DIAGNOSIS — Z01818 Encounter for other preprocedural examination: Secondary | ICD-10-CM | POA: Diagnosis not present

## 2018-08-11 DIAGNOSIS — M25561 Pain in right knee: Secondary | ICD-10-CM | POA: Diagnosis not present

## 2018-08-11 DIAGNOSIS — M1711 Unilateral primary osteoarthritis, right knee: Secondary | ICD-10-CM | POA: Diagnosis not present

## 2018-08-29 DIAGNOSIS — M533 Sacrococcygeal disorders, not elsewhere classified: Secondary | ICD-10-CM | POA: Diagnosis not present

## 2018-09-06 ENCOUNTER — Encounter (HOSPITAL_COMMUNITY): Payer: Self-pay | Admitting: Physician Assistant

## 2018-09-06 DIAGNOSIS — M797 Fibromyalgia: Secondary | ICD-10-CM | POA: Diagnosis present

## 2018-09-06 DIAGNOSIS — F32A Depression, unspecified: Secondary | ICD-10-CM | POA: Diagnosis present

## 2018-09-06 DIAGNOSIS — D649 Anemia, unspecified: Secondary | ICD-10-CM | POA: Diagnosis present

## 2018-09-06 DIAGNOSIS — F329 Major depressive disorder, single episode, unspecified: Secondary | ICD-10-CM | POA: Diagnosis present

## 2018-09-06 DIAGNOSIS — M545 Low back pain, unspecified: Secondary | ICD-10-CM | POA: Diagnosis present

## 2018-09-06 DIAGNOSIS — F419 Anxiety disorder, unspecified: Secondary | ICD-10-CM | POA: Diagnosis present

## 2018-09-06 DIAGNOSIS — G8929 Other chronic pain: Secondary | ICD-10-CM | POA: Diagnosis present

## 2018-09-06 DIAGNOSIS — M1711 Unilateral primary osteoarthritis, right knee: Secondary | ICD-10-CM | POA: Diagnosis present

## 2018-09-06 NOTE — H&P (View-Only) (Signed)
TOTAL KNEE ADMISSION H&P  Patient is being admitted for right total knee arthroplasty.  Subjective:  Chief Complaint:right knee pain.  HPI: [name], 57 y.o. female, has a history of pain and functional disability in the right knee due to arthritis and has failed non-surgical conservative treatments for greater than 12 weeks to includeNSAID's and/or analgesics, corticosteriod injections, viscosupplementation injections, flexibility and strengthening excercises, use of assistive devices and weight reduction as appropriate.  Onset of symptoms was gradual, starting 10 years ago with gradually worsening course since that time. The patient noted prior procedures on the knee to include  arthroscopy, menisectomy and ACL reconstruction on the right knee(s).  Patient currently rates pain in the right knee(s) at 10 out of 10 with activity. Patient has night pain, worsening of pain with activity and weight bearing, pain that interferes with activities of daily living, crepitus and joint swelling.  Patient has evidence of subchondral sclerosis, periarticular osteophytes and joint space narrowing by imaging studies.  There is no active infection.  Patient Active Problem List   Diagnosis Date Noted  . Primary localized osteoarthritis of right knee   . Fibromyalgia   . Depression   . Chronic pain   . Chronic lower back pain   . Anxiety   . Anemia   . DJD (degenerative joint disease) 01/07/2015  . Headache(784.0) 06/07/2014  . Alcoholic pancreatitis 52/84/1324   Past Medical History:  Diagnosis Date  . Alcoholic pancreatitis 40/1027   01/15/13  . Anemia    Reports being borderline  . Anxiety   . Chronic lower back pain   . Chronic pain    treated at [facility]   . Depression   . Fibromyalgia   . History of blood transfusion    "related to ORs"  . Migraine    "monthly" (03/25/2015)  . Osteoarthritis    "wrists, knees, back, neck, fingers,  q joint" (03/25/2015)  . Primary  localized osteoarthritis of right knee     Past Surgical History:  Procedure Laterality Date  . ANTERIOR CERVICAL DECOMP/DISCECTOMY FUSION  ~ 2008  . BACK SURGERY    . CARPAL TUNNEL RELEASE Right   . CYSTECTOMY Right 1980's   hand  . CYSTECTOMY     "off my mid to lower back"  . ENDOMETRIAL ABLATION  ~ 2005  . JOINT REPLACEMENT    . KNEE ARTHROPLASTY Left   . KNEE SURGERY Left    "I've had 9 OR's on this knee in the last 30 yrs"  . KNEE SURGERY     "I've had ~ 4 ORs on this knee in the last 20 yrs" (03/25/2015)  . LAPAROSCOPIC CHOLECYSTECTOMY  ~ 2010  . OSTEOTOMY AND ULNAR SHORTENING Left ~ 2010 X 2  . POSTERIOR FUSION CERVICAL SPINE  ~ 2009  . TONSILLECTOMY  1960's  . TOTAL HIP ARTHROPLASTY Left 01/07/2015   Procedure: TOTAL HIP ARTHROPLASTY ANTERIOR APPROACH;  Surgeon: Renette Butters, MD;  Location: [place];  Service: Orthopedics;  Laterality: Left;  . TOTAL HIP ARTHROPLASTY Right 03/25/2015  . TOTAL HIP ARTHROPLASTY Right 03/25/2015   Procedure: RIGHT TOTAL HIP ARTHROPLASTY ANTERIOR APPROACH;  Surgeon: Renette Butters, MD;  Location: [place];  Service: Orthopedics;  Laterality: Right;    No current facility-administered medications for this encounter.    Current Outpatient Medications  Medication Sig Dispense Refill Last Dose  . baclofen (LIORESAL) 10 MG tablet Take 10 mg by mouth at bedtime.    Taking  . Buprenorphine  HCl (BELBUCA) 600 MCG FILM Place 600 mcg inside cheek every 12 (twelve) hours.     Marland Kitchen buPROPion (WELLBUTRIN XL) 150 MG 24 hr tablet Take 450 mg by mouth daily.    Taking  . cyanocobalamin 2000 MCG tablet Take 2,000 mcg by mouth daily.     . cyclobenzaprine (FLEXERIL) 10 MG tablet Take 10 mg by mouth 3 (three) times daily as needed for muscle spasms.     Marland Kitchen docusate sodium (COLACE) 100 MG capsule Take 1 capsule (100 mg total) by mouth 2 (two) times daily. (Patient taking differently: Take 100 mg by mouth every other day. ) 10 capsule 0 Taking  . furosemide (LASIX) 40 MG  tablet Take 40 mg by mouth every other day.     . meclizine (ANTIVERT) 25 MG tablet Take 25 mg by mouth 3 (three) times daily as needed for dizziness.   Taking  . meloxicam (MOBIC) 15 MG tablet Take 1 tablet (15 mg total) by mouth daily. 30 tablet 1 Taking  . Multiple Vitamins-Minerals (MULTIVITAMIN PO) Take 1 tablet by mouth daily.    Taking  . Omega-3 Fatty Acids (FISH OIL) 1000 MG CAPS Take 1,000 mg by mouth daily.    Taking  . ondansetron (ZOFRAN) 4 MG tablet Take 1 tablet (4 mg total) by mouth every 8 (eight) hours as needed for nausea or vomiting. 20 tablet 0 Taking  . SUMAtriptan (IMITREX) 100 MG tablet Take 100 mg by mouth every 2 (two) hours as needed for migraine.    Taking  . topiramate (TOPAMAX) 100 MG tablet Take 100 mg by mouth 2 (two) times daily.     . traZODone (DESYREL) 150 MG tablet Take 150 mg by mouth at bedtime.     Marland Kitchen amitriptyline (ELAVIL) 10 MG tablet Take 1.5 tablets (15 mg total) by mouth at bedtime. (Patient not taking: Reported on 09/05/2018) 45 tablet 6 Not Taking at Unknown time  . aspirin 325 MG tablet Take 1 tablet (325 mg total) by mouth daily. (Patient not taking: Reported on 09/17/2015) 30 tablet 0 Not Taking  . oxyCODONE (ROXICODONE) 5 MG immediate release tablet Take 1 tablet (5 mg total) by mouth every 4 (four) hours as needed for severe pain. (Patient not taking: Reported on 09/17/2015) 60 tablet 0 Not Taking  . oxyCODONE-acetaminophen (PERCOCET) 5-325 MG per tablet Take 1-2 tablets by mouth every 4 (four) hours as needed for severe pain. (Patient not taking: Reported on 09/17/2015) 90 tablet 0 Not Taking   Allergies  Allergen Reactions  . Morphine And Related Itching  . Vimovo [Naproxen-Esomeprazole] Swelling    Social History   Tobacco Use  . Smoking status: Current Every Day Smoker    Packs/day: 1.00    Years: 35.00    Pack years: 35.00    Types: Cigarettes  . Smokeless tobacco: Never Used  Substance Use Topics  . Alcohol use: No     Alcohol/week: 0.0 standard drinks    Comment: 03/25/2015 "not since I had pancreatitis"    Family History  Problem Relation Age of Onset  . Cancer Mother   . Cancer Sister      Review of Systems  Constitutional: Negative.   HENT: Negative.   Eyes: Negative.   Respiratory: Negative.   Cardiovascular: Negative.   Gastrointestinal: Negative.   Genitourinary: Negative.   Musculoskeletal: Positive for back pain and joint pain.  Skin: Negative.   Neurological: Negative.   Endo/Heme/Allergies: Negative.   Psychiatric/Behavioral: Negative.     Objective:  Physical Exam  Constitutional: She is oriented to person, place, and time. She appears well-developed and well-nourished.  HENT:  Head: Normocephalic and atraumatic.  Mouth/Throat: Oropharynx is clear and moist.  Eyes: EOM are normal.  Neck: Neck supple.  Cardiovascular: Normal rate.  Respiratory: Effort normal.  GI: Soft.  Genitourinary:  Genitourinary Comments: Not pertinent to current symptomatology therefore not examined.  Musculoskeletal:  Examination of her right knee reveals pain medially and laterally.  1+ crepitation.  1+ synovitis.  Range of motion 0-120 degrees.  Knee is stable with normal patella tracking.  Examination of her left knee reveals well healed total knee incision without swelling or pain.  Range of motion 0-120 degrees.  Knee is stable with normal patella tracking.    Neurological: She is alert and oriented to person, place, and time.  Skin: Skin is warm and dry.  Psychiatric: She has a normal mood and affect. Her behavior is normal.    Vital signs in last 24 hours: Temp:  [97.8 F (36.6 C)] 97.8 F (36.6 C) (10/16 1100) Pulse Rate:  [67] 67 (10/16 1100) BP: (113)/(75) 113/75 (10/16 1100) SpO2:  [98 %] 98 % (10/16 1100) Weight:  [76.2 kg] 76.2 kg (10/16 1100)  Labs:   Estimated body mass index is 27.12 kg/m as calculated from the following:   Height as of this encounter: 5\' 6"  (1.676 m).    Weight as of this encounter: 76.2 kg.   Imaging Review Plain radiographs demonstrate severe degenerative joint disease of the right knee(s). The overall alignment issignificant varus. The bone quality appears to be good for age and reported activity level.   Preoperative templating of the joint replacement has been completed, documented, and submitted to the Operating Room personnel in order to optimize intra-operative equipment management.    Patient's anticipated LOS is less than 2 midnights, meeting these requirements: - Younger than 17 - Lives within 1 hour of care - Has a competent adult at home to recover with post-op recover - NO history of  - Chronic pain requiring opiods  - Diabetes  - Coronary Artery Disease  - Heart failure  - Heart attack  - Stroke  - DVT/VTE  - Cardiac arrhythmia  - Respiratory Failure/COPD  - Renal failure  - Anemia  - Advanced Liver disease        Assessment/Plan:  End stage arthritis, right knee   The patient history, physical examination, clinical judgment of the provider and imaging studies are consistent with end stage degenerative joint disease of the right knee(s) and total knee arthroplasty is deemed medically necessary. The treatment options including medical management, injection therapy arthroscopy and arthroplasty were discussed at length. The risks and benefits of total knee arthroplasty were presented and reviewed. The risks due to aseptic loosening, infection, stiffness, patella tracking problems, thromboembolic complications and other imponderables were discussed. The patient acknowledged the explanation, agreed to proceed with the plan and consent was signed. Patient is being admitted for inpatient treatment for surgery, pain control, PT, OT, prophylactic antibiotics, VTE prophylaxis, progressive ambulation and ADL's and discharge planning. The patient is planning to be discharged home with home health services

## 2018-09-06 NOTE — H&P (Signed)
TOTAL KNEE ADMISSION H&P  Patient is being admitted for right total knee arthroplasty.  Subjective:  Chief Complaint:right knee pain.  HPI: [name], 57 y.o. female, has a history of pain and functional disability in the right knee due to arthritis and has failed non-surgical conservative treatments for greater than 12 weeks to includeNSAID's and/or analgesics, corticosteriod injections, viscosupplementation injections, flexibility and strengthening excercises, use of assistive devices and weight reduction as appropriate.  Onset of symptoms was gradual, starting 10 years ago with gradually worsening course since that time. The patient noted prior procedures on the knee to include  arthroscopy, menisectomy and ACL reconstruction on the right knee(s).  Patient currently rates pain in the right knee(s) at 10 out of 10 with activity. Patient has night pain, worsening of pain with activity and weight bearing, pain that interferes with activities of daily living, crepitus and joint swelling.  Patient has evidence of subchondral sclerosis, periarticular osteophytes and joint space narrowing by imaging studies.  There is no active infection.  Patient Active Problem List   Diagnosis Date Noted  . Primary localized osteoarthritis of right knee   . Fibromyalgia   . Depression   . Chronic pain   . Chronic lower back pain   . Anxiety   . Anemia   . DJD (degenerative joint disease) 01/07/2015  . Headache(784.0) 06/07/2014  . Alcoholic pancreatitis 48/18/5631   Past Medical History:  Diagnosis Date  . Alcoholic pancreatitis 49/7026   01/15/13  . Anemia    Reports being borderline  . Anxiety   . Chronic lower back pain   . Chronic pain    treated at [facility]   . Depression   . Fibromyalgia   . History of blood transfusion    "related to ORs"  . Migraine    "monthly" (03/25/2015)  . Osteoarthritis    "wrists, knees, back, neck, fingers,  q joint" (03/25/2015)  . Primary  localized osteoarthritis of right knee     Past Surgical History:  Procedure Laterality Date  . ANTERIOR CERVICAL DECOMP/DISCECTOMY FUSION  ~ 2008  . BACK SURGERY    . CARPAL TUNNEL RELEASE Right   . CYSTECTOMY Right 1980's   hand  . CYSTECTOMY     "off my mid to lower back"  . ENDOMETRIAL ABLATION  ~ 2005  . JOINT REPLACEMENT    . KNEE ARTHROPLASTY Left   . KNEE SURGERY Left    "I've had 9 OR's on this knee in the last 30 yrs"  . KNEE SURGERY     "I've had ~ 4 ORs on this knee in the last 20 yrs" (03/25/2015)  . LAPAROSCOPIC CHOLECYSTECTOMY  ~ 2010  . OSTEOTOMY AND ULNAR SHORTENING Left ~ 2010 X 2  . POSTERIOR FUSION CERVICAL SPINE  ~ 2009  . TONSILLECTOMY  1960's  . TOTAL HIP ARTHROPLASTY Left 01/07/2015   Procedure: TOTAL HIP ARTHROPLASTY ANTERIOR APPROACH;  Surgeon: Renette Butters, MD;  Location: [place];  Service: Orthopedics;  Laterality: Left;  . TOTAL HIP ARTHROPLASTY Right 03/25/2015  . TOTAL HIP ARTHROPLASTY Right 03/25/2015   Procedure: RIGHT TOTAL HIP ARTHROPLASTY ANTERIOR APPROACH;  Surgeon: Renette Butters, MD;  Location: [place];  Service: Orthopedics;  Laterality: Right;    No current facility-administered medications for this encounter.    Current Outpatient Medications  Medication Sig Dispense Refill Last Dose  . baclofen (LIORESAL) 10 MG tablet Take 10 mg by mouth at bedtime.    Taking  . Buprenorphine  HCl (BELBUCA) 600 MCG FILM Place 600 mcg inside cheek every 12 (twelve) hours.     Marland Kitchen buPROPion (WELLBUTRIN XL) 150 MG 24 hr tablet Take 450 mg by mouth daily.    Taking  . cyanocobalamin 2000 MCG tablet Take 2,000 mcg by mouth daily.     . cyclobenzaprine (FLEXERIL) 10 MG tablet Take 10 mg by mouth 3 (three) times daily as needed for muscle spasms.     Marland Kitchen docusate sodium (COLACE) 100 MG capsule Take 1 capsule (100 mg total) by mouth 2 (two) times daily. (Patient taking differently: Take 100 mg by mouth every other day. ) 10 capsule 0 Taking  . furosemide (LASIX) 40 MG  tablet Take 40 mg by mouth every other day.     . meclizine (ANTIVERT) 25 MG tablet Take 25 mg by mouth 3 (three) times daily as needed for dizziness.   Taking  . meloxicam (MOBIC) 15 MG tablet Take 1 tablet (15 mg total) by mouth daily. 30 tablet 1 Taking  . Multiple Vitamins-Minerals (MULTIVITAMIN PO) Take 1 tablet by mouth daily.    Taking  . Omega-3 Fatty Acids (FISH OIL) 1000 MG CAPS Take 1,000 mg by mouth daily.    Taking  . ondansetron (ZOFRAN) 4 MG tablet Take 1 tablet (4 mg total) by mouth every 8 (eight) hours as needed for nausea or vomiting. 20 tablet 0 Taking  . SUMAtriptan (IMITREX) 100 MG tablet Take 100 mg by mouth every 2 (two) hours as needed for migraine.    Taking  . topiramate (TOPAMAX) 100 MG tablet Take 100 mg by mouth 2 (two) times daily.     . traZODone (DESYREL) 150 MG tablet Take 150 mg by mouth at bedtime.     Marland Kitchen amitriptyline (ELAVIL) 10 MG tablet Take 1.5 tablets (15 mg total) by mouth at bedtime. (Patient not taking: Reported on 09/05/2018) 45 tablet 6 Not Taking at Unknown time  . aspirin 325 MG tablet Take 1 tablet (325 mg total) by mouth daily. (Patient not taking: Reported on 09/17/2015) 30 tablet 0 Not Taking  . oxyCODONE (ROXICODONE) 5 MG immediate release tablet Take 1 tablet (5 mg total) by mouth every 4 (four) hours as needed for severe pain. (Patient not taking: Reported on 09/17/2015) 60 tablet 0 Not Taking  . oxyCODONE-acetaminophen (PERCOCET) 5-325 MG per tablet Take 1-2 tablets by mouth every 4 (four) hours as needed for severe pain. (Patient not taking: Reported on 09/17/2015) 90 tablet 0 Not Taking   Allergies  Allergen Reactions  . Morphine And Related Itching  . Vimovo [Naproxen-Esomeprazole] Swelling    Social History   Tobacco Use  . Smoking status: Current Every Day Smoker    Packs/day: 1.00    Years: 35.00    Pack years: 35.00    Types: Cigarettes  . Smokeless tobacco: Never Used  Substance Use Topics  . Alcohol use: No     Alcohol/week: 0.0 standard drinks    Comment: 03/25/2015 "not since I had pancreatitis"    Family History  Problem Relation Age of Onset  . Cancer Mother   . Cancer Sister      Review of Systems  Constitutional: Negative.   HENT: Negative.   Eyes: Negative.   Respiratory: Negative.   Cardiovascular: Negative.   Gastrointestinal: Negative.   Genitourinary: Negative.   Musculoskeletal: Positive for back pain and joint pain.  Skin: Negative.   Neurological: Negative.   Endo/Heme/Allergies: Negative.   Psychiatric/Behavioral: Negative.     Objective:  Physical Exam  Constitutional: She is oriented to person, place, and time. She appears well-developed and well-nourished.  HENT:  Head: Normocephalic and atraumatic.  Mouth/Throat: Oropharynx is clear and moist.  Eyes: EOM are normal.  Neck: Neck supple.  Cardiovascular: Normal rate.  Respiratory: Effort normal.  GI: Soft.  Genitourinary:  Genitourinary Comments: Not pertinent to current symptomatology therefore not examined.  Musculoskeletal:  Examination of her right knee reveals pain medially and laterally.  1+ crepitation.  1+ synovitis.  Range of motion 0-120 degrees.  Knee is stable with normal patella tracking.  Examination of her left knee reveals well healed total knee incision without swelling or pain.  Range of motion 0-120 degrees.  Knee is stable with normal patella tracking.    Neurological: She is alert and oriented to person, place, and time.  Skin: Skin is warm and dry.  Psychiatric: She has a normal mood and affect. Her behavior is normal.    Vital signs in last 24 hours: Temp:  [97.8 F (36.6 C)] 97.8 F (36.6 C) (10/16 1100) Pulse Rate:  [67] 67 (10/16 1100) BP: (113)/(75) 113/75 (10/16 1100) SpO2:  [98 %] 98 % (10/16 1100) Weight:  [76.2 kg] 76.2 kg (10/16 1100)  Labs:   Estimated body mass index is 27.12 kg/m as calculated from the following:   Height as of this encounter: 5\' 6"  (1.676 m).    Weight as of this encounter: 76.2 kg.   Imaging Review Plain radiographs demonstrate severe degenerative joint disease of the right knee(s). The overall alignment issignificant varus. The bone quality appears to be good for age and reported activity level.   Preoperative templating of the joint replacement has been completed, documented, and submitted to the Operating Room personnel in order to optimize intra-operative equipment management.    Patient's anticipated LOS is less than 2 midnights, meeting these requirements: - Younger than 44 - Lives within 1 hour of care - Has a competent adult at home to recover with post-op recover - NO history of  - Chronic pain requiring opiods  - Diabetes  - Coronary Artery Disease  - Heart failure  - Heart attack  - Stroke  - DVT/VTE  - Cardiac arrhythmia  - Respiratory Failure/COPD  - Renal failure  - Anemia  - Advanced Liver disease        Assessment/Plan:  End stage arthritis, right knee   The patient history, physical examination, clinical judgment of the provider and imaging studies are consistent with end stage degenerative joint disease of the right knee(s) and total knee arthroplasty is deemed medically necessary. The treatment options including medical management, injection therapy arthroscopy and arthroplasty were discussed at length. The risks and benefits of total knee arthroplasty were presented and reviewed. The risks due to aseptic loosening, infection, stiffness, patella tracking problems, thromboembolic complications and other imponderables were discussed. The patient acknowledged the explanation, agreed to proceed with the plan and consent was signed. Patient is being admitted for inpatient treatment for surgery, pain control, PT, OT, prophylactic antibiotics, VTE prophylaxis, progressive ambulation and ADL's and discharge planning. The patient is planning to be discharged home with home health services

## 2018-09-07 NOTE — Pre-Procedure Instructions (Signed)
[name]  09/07/2018      [id number] - [place], [place] - [address] [place] [address] Phone: [phone] Fax: [phone]  [id number] - [place], [place] [address] Phone: [phone] Fax: [phone]    Your procedure is scheduled on Monday October 28.  Report to [facility] Admitting at 5:30 A.M.  Call this number if you have problems the morning of surgery:  [phone]   Remember:  Do not eat or drink after midnight.    Take these medicines the morning of surgery with A SIP OF WATER:   Bupropion (Wellbutrin) Meclizine (Antivert) if needed Topiramate (Topamax) Flexeril if needed  STOP taking Belbuca (Buprenorphine) 3 days prior to surgery  7 days prior to surgery STOP taking any Aspirin(unless otherwise instructed by your surgeon), Aleve, Naproxen, Ibuprofen, Motrin, Advil, Goody's, BC's, all herbal medications, fish oil, and all vitamins      Do not wear jewelry, make-up or nail polish.  Do not wear lotions, powders, or perfumes, or deodorant.  Do not shave 48 hours prior to surgery.  Men may shave face and neck.  Do not bring valuables to the hospital.  [facility] is not responsible for any belongings or valuables.  Contacts, dentures or bridgework may not be worn into surgery.  Leave your suitcase in the car.  After surgery it may be brought to your room.  For patients admitted to the hospital, discharge time will be determined by your treatment team.  Patients discharged the day of surgery will not be allowed to drive home.   Special instructions:    [facility]- Preparing For Surgery  Before surgery, you can play an important role. Because skin is not sterile, your skin needs to be as free of germs as possible. You can reduce the number of germs on your skin by washing with CHG (chlorahexidine gluconate)  Soap before surgery.  CHG is an antiseptic cleaner which kills germs and bonds with the skin to continue killing germs even after washing.    Oral Hygiene is also important to reduce your risk of infection.  Remember - BRUSH YOUR TEETH THE MORNING OF SURGERY WITH YOUR REGULAR TOOTHPASTE  Please do not use if you have an allergy to CHG or antibacterial soaps. If your skin becomes reddened/irritated stop using the CHG.  Do not shave (including legs and underarms) for at least 48 hours prior to first CHG shower. It is OK to shave your face.  Please follow these instructions carefully.   1. Shower the NIGHT BEFORE SURGERY and the MORNING OF SURGERY with CHG.   2. If you chose to wash your hair, wash your hair first as usual with your normal shampoo.  3. After you shampoo, rinse your hair and body thoroughly to remove the shampoo.  4. Use CHG as you would any other liquid soap. You can apply CHG directly to the skin and wash gently with a scrungie or a clean washcloth.   5. Apply the CHG Soap to your body ONLY FROM THE NECK DOWN.  Do not use on open wounds or open sores. Avoid contact with your eyes, ears, mouth and genitals (private parts). Wash Face and genitals (private parts)  with your normal soap.  6. Wash thoroughly, paying special attention to the area where your surgery will be performed.  7. Thoroughly rinse your body with warm water from the neck down.  8. DO NOT shower/wash with your normal soap after using and rinsing off the CHG Soap.  9. Pat yourself dry with a CLEAN TOWEL.  10. Wear CLEAN PAJAMAS to bed the night before surgery, wear comfortable clothes the morning of surgery  11. Place CLEAN SHEETS on your bed the night of your first shower and DO NOT SLEEP WITH PETS.    Day of Surgery:  Do not apply any deodorants/lotions.  Please wear clean clothes to the hospital/surgery center.   Remember to brush your teeth WITH YOUR REGULAR TOOTHPASTE.    Please read over  the following fact sheets that you were given. Coughing and Deep Breathing, Total Joint Packet, MRSA Information and Surgical Site Infection Prevention

## 2018-09-08 ENCOUNTER — Other Ambulatory Visit: Payer: Self-pay

## 2018-09-08 ENCOUNTER — Encounter (HOSPITAL_COMMUNITY)
Admission: RE | Admit: 2018-09-08 | Discharge: 2018-09-08 | Disposition: A | Discharge status: Home / Self Care | Payer: Medicare HMO | Source: Ambulatory Visit | Attending: Orthopedic Surgery | Admitting: Orthopedic Surgery

## 2018-09-08 ENCOUNTER — Encounter (HOSPITAL_COMMUNITY): Payer: Self-pay

## 2018-09-08 DIAGNOSIS — Z01812 Encounter for preprocedural laboratory examination: Secondary | ICD-10-CM | POA: Diagnosis not present

## 2018-09-08 LAB — CBC WITH DIFFERENTIAL/PLATELET
ABS IMMATURE GRANULOCYTES: 0.03 10*3/uL (ref 0.00–0.07)
BASOS PCT: 1 %
Basophils Absolute: 0.1 10*3/uL (ref 0.0–0.1)
Eosinophils Absolute: 0.2 10*3/uL (ref 0.0–0.5)
Eosinophils Relative: 3 %
HCT: 40 % (ref 36.0–46.0)
HEMOGLOBIN: 12.2 g/dL (ref 12.0–15.0)
Immature Granulocytes: 0 %
LYMPHS PCT: 30 %
Lymphs Abs: 2.4 10*3/uL (ref 0.7–4.0)
MCH: 28.8 pg (ref 26.0–34.0)
MCHC: 30.5 g/dL (ref 30.0–36.0)
MCV: 94.3 fL (ref 80.0–100.0)
MONO ABS: 0.5 10*3/uL (ref 0.1–1.0)
MONOS PCT: 6 %
NEUTROS PCT: 60 %
Neutro Abs: 4.9 10*3/uL (ref 1.7–7.7)
PLATELETS: 221 10*3/uL (ref 150–400)
RBC: 4.24 MIL/uL (ref 3.87–5.11)
RDW: 13.2 % (ref 11.5–15.5)
WBC: 8.1 10*3/uL (ref 4.0–10.5)
nRBC: 0 % (ref 0.0–0.2)

## 2018-09-08 LAB — TYPE AND SCREEN
ABO/RH(D): O NEG
ANTIBODY SCREEN: NEGATIVE

## 2018-09-08 LAB — SURGICAL PCR SCREEN
MRSA, PCR: NEGATIVE
Staphylococcus aureus: POSITIVE — AB

## 2018-09-08 LAB — PROTIME-INR
INR: 1.06
PROTHROMBIN TIME: 13.7 s (ref 11.4–15.2)

## 2018-09-08 LAB — COMPREHENSIVE METABOLIC PANEL
ALBUMIN: 4.3 g/dL (ref 3.5–5.0)
ALK PHOS: 64 U/L (ref 38–126)
ALT: 17 U/L (ref 0–44)
AST: 20 U/L (ref 15–41)
Anion gap: 10 (ref 5–15)
BUN: 19 mg/dL (ref 6–20)
CALCIUM: 9.8 mg/dL (ref 8.9–10.3)
CHLORIDE: 105 mmol/L (ref 98–111)
CO2: 23 mmol/L (ref 22–32)
CREATININE: 0.83 mg/dL (ref 0.44–1.00)
GFR calc non Af Amer: >60 mL/min (ref >60)
GLUCOSE: 105 mg/dL — AB (ref 70–99)
Potassium: 3.6 mmol/L (ref 3.5–5.1)
SODIUM: 138 mmol/L (ref 135–145)
Total Bilirubin: 0.6 mg/dL (ref 0.3–1.2)
Total Protein: 7.1 g/dL (ref 6.5–8.1)

## 2018-09-08 NOTE — Progress Notes (Signed)
PCP - Orpah Melter Cardiologist - denies  EKG - requested from PCP  Anesthesia review: follow up on requested EKG  Patient denies shortness of breath, fever, cough and chest pain at PAT appointment   Patient verbalized understanding of instructions that were given to them at the PAT appointment. Patient was also instructed that they will need to review over the PAT instructions again at home before surgery.

## 2018-09-09 LAB — URINE CULTURE

## 2018-09-11 ENCOUNTER — Encounter (HOSPITAL_COMMUNITY): Payer: Self-pay | Admitting: Physician Assistant

## 2018-09-13 DIAGNOSIS — G8929 Other chronic pain: Secondary | ICD-10-CM | POA: Diagnosis not present

## 2018-09-13 DIAGNOSIS — G894 Chronic pain syndrome: Secondary | ICD-10-CM | POA: Diagnosis not present

## 2018-09-13 DIAGNOSIS — M25561 Pain in right knee: Secondary | ICD-10-CM | POA: Diagnosis not present

## 2018-09-15 DIAGNOSIS — M25561 Pain in right knee: Secondary | ICD-10-CM | POA: Diagnosis not present

## 2018-09-15 MED ORDER — BUPIVACAINE LIPOSOME 1.3 % IJ SUSP
20.0000 mL | INTRAMUSCULAR | Status: DC
  Filled 2018-09-15: qty 20

## 2018-09-15 MED ORDER — TRANEXAMIC ACID-NACL 1000-0.7 MG/100ML-% IV SOLN
1000.0000 mg | INTRAVENOUS | Status: DC
  Filled 2018-09-15: qty 100

## 2018-09-18 ENCOUNTER — Other Ambulatory Visit: Payer: Self-pay

## 2018-09-18 ENCOUNTER — Encounter (HOSPITAL_COMMUNITY): Admission: RE | Payer: Self-pay | Source: Ambulatory Visit

## 2018-09-18 ENCOUNTER — Inpatient Hospital Stay (HOSPITAL_COMMUNITY): Admission: RE | Admit: 2018-09-18 | Payer: Medicare HMO | Source: Ambulatory Visit | Admitting: Orthopedic Surgery

## 2018-09-18 ENCOUNTER — Encounter (HOSPITAL_BASED_OUTPATIENT_CLINIC_OR_DEPARTMENT_OTHER): Payer: Self-pay | Admitting: Physician Assistant

## 2018-09-18 DIAGNOSIS — S83271A Complex tear of lateral meniscus, current injury, right knee, initial encounter: Secondary | ICD-10-CM | POA: Diagnosis present

## 2018-09-18 HISTORY — DX: Unilateral primary osteoarthritis, right knee: M17.11

## 2018-09-18 HISTORY — DX: Complex tear of lateral meniscus, current injury, right knee, initial encounter: S83.271A

## 2018-09-18 SURGERY — ARTHROPLASTY, KNEE, TOTAL
Anesthesia: Spinal | Site: Knee | Laterality: Right

## 2018-09-18 NOTE — Progress Notes (Signed)
Per Dr Kerin Perna, [place] for patient to resume Buprenorphine for chronic pain syndrome that is managed by her Pain Clinic.

## 2018-09-18 NOTE — Anesthesia Preprocedure Evaluation (Signed)
Anesthesia Evaluation  Patient identified by MRN, date of birth, ID band Patient awake    Reviewed: Allergy & Precautions, H&P , NPO status , Patient's Chart, lab work & pertinent test results, reviewed documented beta blocker date and time   Airway Mallampati: II  TM Distance: >3 FB Neck ROM: Full    Dental no notable dental hx. (+) Teeth Intact, Dental Advisory Given   Pulmonary neg pulmonary ROS, Current Smoker,    Pulmonary exam normal breath sounds clear to auscultation       Cardiovascular Exercise Tolerance: Good negative cardio ROS   Rhythm:Regular Rate:Normal     Neuro/Psych negative neurological ROS  negative psych ROS   GI/Hepatic negative GI ROS, (+)     substance abuse  alcohol use,   Endo/Other  negative endocrine ROS  Renal/GU negative Renal ROS  negative genitourinary   Musculoskeletal  (+) Arthritis , Osteoarthritis,  Fibromyalgia -, narcotic dependent  Abdominal   Peds  Hematology  (+) Blood dyscrasia, anemia ,   Anesthesia Other Findings   Reproductive/Obstetrics negative OB ROS                             Anesthesia Physical  Anesthesia Plan  ASA: III  Anesthesia Plan: General   Post-op Pain Management:    Induction: Intravenous  PONV Risk Score and Plan: 1 and Ondansetron, Treatment may vary due to age or medical condition and Dexamethasone  Airway Management Planned: Oral ETT and LMA  Additional Equipment:   Intra-op Plan:   Post-operative Plan: Extubation in OR  Informed Consent: I have reviewed the patients History and Physical, chart, labs and discussed the procedure including the risks, benefits and alternatives for the proposed anesthesia with the patient or authorized representative who has indicated his/her understanding and acceptance.   Dental advisory given  Plan Discussed with: CRNA, Anesthesiologist and Surgeon  Anesthesia Plan  Comments: ( )        Anesthesia Quick Evaluation

## 2018-09-18 NOTE — H&P (Signed)
[name] is an 57 y.o. female.   Chief Complaint: right knee lateral meniscus tear HPI: 40 yowf with history of right lateral knee pain.  She has a lateral meniscus tear and moderate osteoarthritis of the right knee.  She is not bone on bone osteoarthritis so she will have a right knee arthroscopy to address the meniscus tear laterally and a chondroplasty as necessary.    Past Medical History:  Diagnosis Date  . Alcoholic pancreatitis 36/6440   01/15/13  . Anemia    Reports being borderline  . Anxiety   . Chronic lower back pain   . Chronic pain    treated at [facility]   . Depression   . Fibromyalgia   . History of blood transfusion    "related to ORs"  . Migraine    "monthly" (03/25/2015)  . Osteoarthritis    "wrists, knees, back, neck, fingers,  q joint" (03/25/2015)  . Primary localized osteoarthritis of right knee     Past Surgical History:  Procedure Laterality Date  . ANTERIOR CERVICAL DECOMP/DISCECTOMY FUSION  ~ 2008  . BACK SURGERY    . CARPAL TUNNEL RELEASE Right   . CYSTECTOMY Right 1980's   hand  . CYSTECTOMY     "off my mid to lower back"  . ENDOMETRIAL ABLATION  ~ 2005  . JOINT REPLACEMENT    . KNEE SURGERY Left    "I've had 9 OR's on this knee in the last 30 yrs"  . KNEE SURGERY     "I've had ~ 4 ORs on this knee in the last 20 yrs" (03/25/2015)  . LAPAROSCOPIC CHOLECYSTECTOMY  ~ 2010  . OSTEOTOMY AND ULNAR SHORTENING Left ~ 2010 X 2  . POSTERIOR FUSION CERVICAL SPINE  ~ 2009  . TONSILLECTOMY  1960's  . TOTAL HIP ARTHROPLASTY Left 01/07/2015   Procedure: TOTAL HIP ARTHROPLASTY ANTERIOR APPROACH;  Surgeon: Renette Butters, MD;  Location: [place];  Service: Orthopedics;  Laterality: Left;  . TOTAL HIP ARTHROPLASTY Right 03/25/2015  . TOTAL HIP ARTHROPLASTY Right 03/25/2015   Procedure: RIGHT TOTAL HIP ARTHROPLASTY ANTERIOR APPROACH;  Surgeon: Renette Butters, MD;  Location: [place];  Service: Orthopedics;  Laterality: Right;    Family History   Problem Relation Age of Onset  . Cancer Mother   . Cancer Sister    Social History:  reports that she has been smoking cigarettes. She has a 8.75 pack-year smoking history. She has never used smokeless tobacco. She reports that she does not drink alcohol or use drugs.  Allergies:  Allergies  Allergen Reactions  . Morphine And Related Itching  . Vimovo [Naproxen-Esomeprazole] Swelling    Lip swelling    No medications prior to admission.    No results found for this or any previous visit (from the past 48 hour(s)). No results found.  Review of Systems  Constitutional: Negative.   HENT: Negative.   Eyes: Negative.   Respiratory: Negative.   Cardiovascular: Negative.   Gastrointestinal: Negative.   Genitourinary: Negative.   Musculoskeletal: Positive for back pain and joint pain.  Skin: Negative.   Neurological: Negative.   Endo/Heme/Allergies: Negative.   Psychiatric/Behavioral: Negative.     There were no vitals taken for this visit. Physical Exam  Constitutional: She is oriented to person, place, and time. She appears well-developed and well-nourished.  HENT:  Head: Normocephalic and atraumatic.  Mouth/Throat: Oropharynx is clear and moist.  Eyes: Pupils are equal, round, and reactive to light. Conjunctivae are  normal.  Neck: Neck supple.  Cardiovascular: Normal rate and regular rhythm.  Respiratory: Effort normal and breath sounds normal.  GI: Soft. Bowel sounds are normal.  Genitourinary:  Genitourinary Comments: Not pertinent to current symptomatology therefore not examined.  Musculoskeletal:  Right knee range of motion -5 to 120    2+ crep 2+ synovitis, + lateral mcmurray's neg lachman.  Left knee well healed total knee incision with full range of motion, no pain, swelling or instability.  DNVI  Neurological: She is alert and oriented to person, place, and time.  Chronic back pain on long term narcotic therapy  Skin: Skin is warm and dry.  Psychiatric: She  has a normal mood and affect. Her behavior is normal.     Assessment Principal Problem:   Complex tear of lateral meniscus of right knee Active Problems:   Primary localized osteoarthritis of right knee   Chronic pain   Chronic lower back pain   Anxiety   Anemia   Alcoholic pancreatitis    Plan Right knee arthroscopy, partial lateral meniscectomy, and chondroplasty.  The risks, benefits, and possible complications of the procedure were discussed in detail with the patient.  The patient is without question.  [name] Earl Lagos, PA-C 09/18/2018, 9:33 AM

## 2018-09-19 ENCOUNTER — Ambulatory Visit (HOSPITAL_BASED_OUTPATIENT_CLINIC_OR_DEPARTMENT_OTHER): Payer: Medicare HMO | Admitting: Anesthesiology

## 2018-09-19 ENCOUNTER — Encounter (HOSPITAL_BASED_OUTPATIENT_CLINIC_OR_DEPARTMENT_OTHER): Payer: Self-pay | Admitting: Anesthesiology

## 2018-09-19 ENCOUNTER — Encounter (HOSPITAL_BASED_OUTPATIENT_CLINIC_OR_DEPARTMENT_OTHER)
Admission: RE | Disposition: A | Discharge status: Home / Self Care | Payer: Self-pay | Source: Ambulatory Visit | Attending: Orthopedic Surgery

## 2018-09-19 ENCOUNTER — Other Ambulatory Visit: Payer: Self-pay

## 2018-09-19 ENCOUNTER — Ambulatory Visit (HOSPITAL_BASED_OUTPATIENT_CLINIC_OR_DEPARTMENT_OTHER)
Admission: RE | Admit: 2018-09-19 | Discharge: 2018-09-19 | Disposition: A | Discharge status: Home / Self Care | Payer: Medicare HMO | Source: Ambulatory Visit | Attending: Orthopedic Surgery | Admitting: Orthopedic Surgery

## 2018-09-19 DIAGNOSIS — F1120 Opioid dependence, uncomplicated: Secondary | ICD-10-CM | POA: Insufficient documentation

## 2018-09-19 DIAGNOSIS — M19031 Primary osteoarthritis, right wrist: Secondary | ICD-10-CM | POA: Diagnosis not present

## 2018-09-19 DIAGNOSIS — S83281A Other tear of lateral meniscus, current injury, right knee, initial encounter: Secondary | ICD-10-CM | POA: Diagnosis not present

## 2018-09-19 DIAGNOSIS — M19049 Primary osteoarthritis, unspecified hand: Secondary | ICD-10-CM | POA: Diagnosis not present

## 2018-09-19 DIAGNOSIS — M1711 Unilateral primary osteoarthritis, right knee: Secondary | ICD-10-CM | POA: Diagnosis present

## 2018-09-19 DIAGNOSIS — M94261 Chondromalacia, right knee: Secondary | ICD-10-CM | POA: Insufficient documentation

## 2018-09-19 DIAGNOSIS — G8929 Other chronic pain: Secondary | ICD-10-CM | POA: Diagnosis not present

## 2018-09-19 DIAGNOSIS — Z981 Arthrodesis status: Secondary | ICD-10-CM | POA: Diagnosis not present

## 2018-09-19 DIAGNOSIS — F329 Major depressive disorder, single episode, unspecified: Secondary | ICD-10-CM | POA: Diagnosis not present

## 2018-09-19 DIAGNOSIS — F17210 Nicotine dependence, cigarettes, uncomplicated: Secondary | ICD-10-CM | POA: Insufficient documentation

## 2018-09-19 DIAGNOSIS — M23251 Derangement of posterior horn of lateral meniscus due to old tear or injury, right knee: Secondary | ICD-10-CM | POA: Insufficient documentation

## 2018-09-19 DIAGNOSIS — M797 Fibromyalgia: Secondary | ICD-10-CM | POA: Diagnosis not present

## 2018-09-19 DIAGNOSIS — Z96643 Presence of artificial hip joint, bilateral: Secondary | ICD-10-CM | POA: Diagnosis not present

## 2018-09-19 DIAGNOSIS — D649 Anemia, unspecified: Secondary | ICD-10-CM | POA: Diagnosis present

## 2018-09-19 DIAGNOSIS — Z7982 Long term (current) use of aspirin: Secondary | ICD-10-CM | POA: Insufficient documentation

## 2018-09-19 DIAGNOSIS — M19032 Primary osteoarthritis, left wrist: Secondary | ICD-10-CM | POA: Insufficient documentation

## 2018-09-19 DIAGNOSIS — Z885 Allergy status to narcotic agent status: Secondary | ICD-10-CM | POA: Insufficient documentation

## 2018-09-19 DIAGNOSIS — S83271A Complex tear of lateral meniscus, current injury, right knee, initial encounter: Secondary | ICD-10-CM | POA: Diagnosis present

## 2018-09-19 DIAGNOSIS — M23221 Derangement of posterior horn of medial meniscus due to old tear or injury, right knee: Secondary | ICD-10-CM | POA: Diagnosis not present

## 2018-09-19 DIAGNOSIS — F419 Anxiety disorder, unspecified: Secondary | ICD-10-CM | POA: Diagnosis present

## 2018-09-19 DIAGNOSIS — M545 Low back pain, unspecified: Secondary | ICD-10-CM | POA: Diagnosis present

## 2018-09-19 DIAGNOSIS — M47812 Spondylosis without myelopathy or radiculopathy, cervical region: Secondary | ICD-10-CM | POA: Diagnosis not present

## 2018-09-19 DIAGNOSIS — K852 Alcohol induced acute pancreatitis without necrosis or infection: Secondary | ICD-10-CM | POA: Diagnosis present

## 2018-09-19 DIAGNOSIS — M170 Bilateral primary osteoarthritis of knee: Secondary | ICD-10-CM | POA: Insufficient documentation

## 2018-09-19 DIAGNOSIS — S83241A Other tear of medial meniscus, current injury, right knee, initial encounter: Secondary | ICD-10-CM | POA: Diagnosis not present

## 2018-09-19 DIAGNOSIS — Z79899 Other long term (current) drug therapy: Secondary | ICD-10-CM | POA: Diagnosis not present

## 2018-09-19 DIAGNOSIS — Z886 Allergy status to analgesic agent status: Secondary | ICD-10-CM | POA: Diagnosis not present

## 2018-09-19 HISTORY — DX: Complex tear of lateral meniscus, current injury, right knee, initial encounter: S83.271A

## 2018-09-19 HISTORY — PX: KNEE ARTHROSCOPY WITH MEDIAL MENISECTOMY: SHX5651

## 2018-09-19 SURGERY — ARTHROSCOPY, KNEE, WITH MEDIAL MENISCECTOMY
Anesthesia: General | Site: Knee | Laterality: Right

## 2018-09-19 MED ORDER — LACTATED RINGERS IV SOLN
INTRAVENOUS | Status: DC
  Administered 2018-09-19: 07:00:00 via INTRAVENOUS

## 2018-09-19 MED ORDER — LIDOCAINE HCL (CARDIAC) PF 100 MG/5ML IV SOSY
PREFILLED_SYRINGE | INTRAVENOUS | Status: DC | PRN
  Administered 2018-09-19: 60 mg via INTRAVENOUS

## 2018-09-19 MED ORDER — CEFAZOLIN SODIUM-DEXTROSE 2-4 GM/100ML-% IV SOLN
2.0000 g | INTRAVENOUS | Status: AC
  Administered 2018-09-19: 2 g via INTRAVENOUS

## 2018-09-19 MED ORDER — POVIDONE-IODINE 7.5 % EX SOLN: Freq: Once | CUTANEOUS | Status: DC

## 2018-09-19 MED ORDER — SODIUM CHLORIDE 0.9 % IR SOLN
Status: DC | PRN
  Administered 2018-09-19: 3000 mL

## 2018-09-19 MED ORDER — FENTANYL CITRATE (PF) 100 MCG/2ML IJ SOLN
INTRAMUSCULAR | Status: AC
  Filled 2018-09-19: qty 2

## 2018-09-19 MED ORDER — EPHEDRINE SULFATE 50 MG/ML IJ SOLN
INTRAMUSCULAR | Status: DC | PRN
  Administered 2018-09-19: 10 mg via INTRAVENOUS

## 2018-09-19 MED ORDER — CHLORHEXIDINE GLUCONATE 4 % EX LIQD: 60.0000 mL | Freq: Once | CUTANEOUS | Status: DC

## 2018-09-19 MED ORDER — LACTATED RINGERS IV SOLN
INTRAVENOUS | Status: DC
  Administered 2018-09-19 (×2): via INTRAVENOUS

## 2018-09-19 MED ORDER — OXYCODONE HCL 5 MG/5ML PO SOLN: 5.0000 mg | Freq: Once | ORAL | Status: DC | PRN

## 2018-09-19 MED ORDER — OXYCODONE HCL 5 MG PO TABS: 5.0000 mg | ORAL_TABLET | Freq: Once | ORAL | Status: DC | PRN

## 2018-09-19 MED ORDER — ONDANSETRON HCL 4 MG/2ML IJ SOLN
INTRAMUSCULAR | Status: DC | PRN
  Administered 2018-09-19: 4 mg via INTRAVENOUS

## 2018-09-19 MED ORDER — ACETAMINOPHEN 160 MG/5ML PO SOLN: 325.0000 mg | ORAL | Status: DC | PRN

## 2018-09-19 MED ORDER — DEXAMETHASONE SODIUM PHOSPHATE 4 MG/ML IJ SOLN
INTRAMUSCULAR | Status: DC | PRN
  Administered 2018-09-19: 10 mg via INTRAVENOUS

## 2018-09-19 MED ORDER — MIDAZOLAM HCL 2 MG/2ML IJ SOLN
1.0000 mg | INTRAMUSCULAR | Status: DC | PRN
  Administered 2018-09-19: 2 mg via INTRAVENOUS

## 2018-09-19 MED ORDER — ONDANSETRON HCL 4 MG/2ML IJ SOLN: 4.0000 mg | Freq: Once | INTRAMUSCULAR | Status: DC | PRN

## 2018-09-19 MED ORDER — MIDAZOLAM HCL 2 MG/2ML IJ SOLN
INTRAMUSCULAR | Status: AC
  Filled 2018-09-19: qty 2

## 2018-09-19 MED ORDER — ACETAMINOPHEN 325 MG PO TABS: 325.0000 mg | ORAL_TABLET | ORAL | Status: DC | PRN

## 2018-09-19 MED ORDER — SCOPOLAMINE 1 MG/3DAYS TD PT72: 1.0000 | MEDICATED_PATCH | Freq: Once | TRANSDERMAL | Status: DC | PRN

## 2018-09-19 MED ORDER — MEPERIDINE HCL 25 MG/ML IJ SOLN: 6.2500 mg | INTRAMUSCULAR | Status: DC | PRN

## 2018-09-19 MED ORDER — CEFAZOLIN SODIUM-DEXTROSE 2-4 GM/100ML-% IV SOLN
INTRAVENOUS | Status: AC
  Filled 2018-09-19: qty 100

## 2018-09-19 MED ORDER — BUPIVACAINE-EPINEPHRINE (PF) 0.5% -1:200000 IJ SOLN
INTRAMUSCULAR | Status: DC | PRN
  Administered 2018-09-19: 25 mL

## 2018-09-19 MED ORDER — FENTANYL CITRATE (PF) 100 MCG/2ML IJ SOLN
50.0000 ug | INTRAMUSCULAR | Status: AC | PRN
  Administered 2018-09-19: 50 ug via INTRAVENOUS
  Administered 2018-09-19: 100 ug via INTRAVENOUS
  Administered 2018-09-19: 50 ug via INTRAVENOUS

## 2018-09-19 MED ORDER — FENTANYL CITRATE (PF) 100 MCG/2ML IJ SOLN: 25.0000 ug | INTRAMUSCULAR | Status: DC | PRN

## 2018-09-19 MED ORDER — PROPOFOL 10 MG/ML IV BOLUS
INTRAVENOUS | Status: DC | PRN
  Administered 2018-09-19: 180 mg via INTRAVENOUS

## 2018-09-19 SURGICAL SUPPLY — 58 items
APL SKNCLS STERI-STRIP NONHPOA (GAUZE/BANDAGES/DRESSINGS)
BANDAGE ACE 6X5 VEL STRL LF (GAUZE/BANDAGES/DRESSINGS) ×3 IMPLANT
BANDAGE ESMARK 6X9 LF (GAUZE/BANDAGES/DRESSINGS) IMPLANT
BENZOIN TINCTURE PRP APPL 2/3 (GAUZE/BANDAGES/DRESSINGS) IMPLANT
BLADE CUDA GRT WHITE 3.5 (BLADE) IMPLANT
BLADE CUTTER GATOR 3.5 (BLADE) ×2 IMPLANT
BLADE SURG 15 STRL LF DISP TIS (BLADE) IMPLANT
BLADE SURG 15 STRL SS (BLADE)
BNDG CMPR 9X6 STRL LF SNTH (GAUZE/BANDAGES/DRESSINGS)
BNDG COHESIVE 4X5 TAN STRL (GAUZE/BANDAGES/DRESSINGS) IMPLANT
BNDG ESMARK 6X9 LF (GAUZE/BANDAGES/DRESSINGS)
CLOSURE WOUND 1/2 X4 (GAUZE/BANDAGES/DRESSINGS)
COVER WAND RF STERILE (DRAPES) IMPLANT
DRAPE ARTHROSCOPY W/POUCH 90 (DRAPES) ×3 IMPLANT
DURAPREP 26ML APPLICATOR (WOUND CARE) ×3 IMPLANT
GAUZE SPONGE 4X4 12PLY STRL (GAUZE/BANDAGES/DRESSINGS) ×3 IMPLANT
GAUZE XEROFORM 1X8 LF (GAUZE/BANDAGES/DRESSINGS) ×3 IMPLANT
GLOVE BIO SURGEON STRL SZ7 (GLOVE) ×5 IMPLANT
GLOVE BIOGEL PI IND STRL 7.0 (GLOVE) ×1 IMPLANT
GLOVE BIOGEL PI IND STRL 7.5 (GLOVE) ×1 IMPLANT
GLOVE BIOGEL PI INDICATOR 7.0 (GLOVE) ×2
GLOVE BIOGEL PI INDICATOR 7.5 (GLOVE) ×2
GLOVE SS BIOGEL STRL SZ 7.5 (GLOVE) ×1 IMPLANT
GLOVE SUPERSENSE BIOGEL SZ 7.5 (GLOVE) ×2
GOWN STRL REUS W/ TWL LRG LVL3 (GOWN DISPOSABLE) ×2 IMPLANT
GOWN STRL REUS W/ TWL XL LVL3 (GOWN DISPOSABLE) ×1 IMPLANT
GOWN STRL REUS W/TWL LRG LVL3 (GOWN DISPOSABLE) ×6
GOWN STRL REUS W/TWL XL LVL3 (GOWN DISPOSABLE) ×3
HOLDER KNEE FOAM BLUE (MISCELLANEOUS) ×3 IMPLANT
K-WIRE .062X4 (WIRE) IMPLANT
KNEE WRAP E Z 3 GEL PACK (MISCELLANEOUS) ×3 IMPLANT
MANIFOLD NEPTUNE II (INSTRUMENTS) IMPLANT
NDL SAFETY ECLIPSE 18X1.5 (NEEDLE) ×2 IMPLANT
NEEDLE HYPO 18GX1.5 SHARP (NEEDLE) ×3
NEEDLE HYPO 22GX1.5 SAFETY (NEEDLE) IMPLANT
PACK ARTHROSCOPY DSU (CUSTOM PROCEDURE TRAY) ×3 IMPLANT
PACK BASIN DAY SURGERY FS (CUSTOM PROCEDURE TRAY) ×3 IMPLANT
PAD ALCOHOL SWAB (MISCELLANEOUS) IMPLANT
STRIP CLOSURE SKIN 1/2X4 (GAUZE/BANDAGES/DRESSINGS) IMPLANT
SUCTION FRAZIER HANDLE 10FR (MISCELLANEOUS)
SUCTION TUBE FRAZIER 10FR DISP (MISCELLANEOUS) IMPLANT
SUT ETHILON 4 0 PS 2 18 (SUTURE) IMPLANT
SUT FIBERWIRE #2 38 T-5 BLUE (SUTURE)
SUT PDS AB 0 CT 36 (SUTURE) IMPLANT
SUT PROLENE 3 0 PS 2 (SUTURE) IMPLANT
SUT VIC AB 0 CT1 18XCR BRD 8 (SUTURE) IMPLANT
SUT VIC AB 0 CT1 8-18 (SUTURE)
SUT VIC AB 2-0 CT1 27 (SUTURE)
SUT VIC AB 2-0 CT1 TAPERPNT 27 (SUTURE) IMPLANT
SUT VIC AB 3-0 PS1 18 (SUTURE)
SUT VIC AB 3-0 PS1 18XBRD (SUTURE) IMPLANT
SUTURE FIBERWR #2 38 T-5 BLUE (SUTURE) IMPLANT
SYR 20CC LL (SYRINGE) IMPLANT
SYR 5ML LL (SYRINGE) ×3 IMPLANT
TOWEL GREEN STERILE FF (TOWEL DISPOSABLE) ×3 IMPLANT
TUBING ARTHRO INFLOW-ONLY STRL (TUBING) ×3 IMPLANT
WAND STAR VAC 90 (SURGICAL WAND) IMPLANT
WATER STERILE IRR 1000ML POUR (IV SOLUTION) ×3 IMPLANT

## 2018-09-19 NOTE — Anesthesia Procedure Notes (Signed)
Procedure Name: LMA Insertion Performed by: Maryella Shivers, CRNA Pre-anesthesia Checklist: Patient identified, Emergency Drugs available, Suction available and Patient being monitored Patient Re-evaluated:Patient Re-evaluated prior to induction Oxygen Delivery Method: Circle system utilized Preoxygenation: Pre-oxygenation with 100% oxygen Induction Type: IV induction Ventilation: Mask ventilation without difficulty LMA: LMA inserted LMA Size: 4.0 Number of attempts: 1 Airway Equipment and Method: Bite block Placement Confirmation: positive ETCO2 Tube secured with: Tape Dental Injury: Teeth and Oropharynx as per pre-operative assessment

## 2018-09-19 NOTE — Op Note (Signed)
NAME: [name], [name] MEDICAL RECORD DX:[id number] ACCOUNT [id number] DATE OF BIRTH:01/18/1961 FACILITY: [facility] LOCATION: [facility] PHYSICIAN:Ryu Cerreta Venetia Maxon, MD  OPERATIVE REPORT  DATE OF PROCEDURE:  09/19/2018  PREOPERATIVE DIAGNOSES:   1.  Right knee chronic traumatic medial and lateral meniscal tears. 2.  Right knee chondromalacia.  PROCEDURE:   1.  Right knee exam under anesthesia followed by arthroscopic partial medial and lateral meniscectomies. 2.  Right knee chondroplasty.  SURGEON:  Dr. Elsie Saas.  ANESTHESIA:  General.  OPERATIVE TIME:  30 minutes.  COMPLICATIONS:  None.  INDICATIONS:  The patient is a 57 year old who has had significant longstanding right knee pain, increasing in nature.  Initial x-rays and examination were consistent with a possible arthritis needing a total knee replacement.  However, after further  investigation and MRI, this revealed meniscal tearing with chondromalacia, but not significant enough chondromalacia to require knee replacement.  Because of this, we decided with the patient to proceed with knee replacement instead of total knee  replacement and she understood and agreed with this plan.  DESCRIPTION OF PROCEDURE:  The patient was brought to the operating room on 09/19/2018 after a knee block was placed in the holding room by anesthesia.  She was placed on the operating table in supine position.  She received antibiotics preoperatively  for prophylaxis.  After being placed under general anesthesia, her right knee was examined.  She had full range of motion.  Knee was stable ligamentous exam with normal patellar tracking.  The right leg was prepped and using sterile DuraPrep and draped  using sterile technique.  Time-out procedure was called.  The correct right knee identified.  Initially through an anterolateral portal, the arthroscope with pump attached was placed in through an anteromedial portal, an arthroscopic probe was placed.    On initial inspection of medial compartment, the articular cartilage showed grade I and II chondromalacia.  Medial meniscus showed a small tear of the posterior horn 10-15%, which was debrided back to a stable rim.  Intercondylar notch was inspected.   Her ACL graft was intact and stable.  The PCL was stable and intact.  Lateral compartment showed 30-40%, grade III chondromalacia of lateral femoral condyle which was debrided 20%, grade III changes lateral tibial plateau which was debrided.  Lateral  meniscus showed tearing of the posterior and lateral horn of which 30% was resected back to a stable rim.  Patellofemoral joint showed 30% grade III chondromalacia, which was debrided.  The patella tracked normally.  Medial and lateral gutters were free  of pathology.  At this point, it was felt that all pathology had been satisfactorily addressed.  The instruments were removed.  Portals closed with 3-0 nylon suture.  Sterile dressings were applied and the patient awakened and taken to recovery room in  stable condition.  Follow up care with the patient will be followed as an outpatient on oxycodone for pain.  See her back in the office in a week for sutures out and followup.  TN/NUANCE  D:09/19/2018 T:09/19/2018 JOB:[id number]

## 2018-09-19 NOTE — Progress Notes (Signed)
Assisted Dr. Ambrose Pancoast with right, knee block. Side rails up, monitors on throughout procedure. See vital signs in flow sheet. Tolerated Procedure well.

## 2018-09-19 NOTE — Anesthesia Procedure Notes (Addendum)
Anesthesia Regional Block: Knee block   Pre-Anesthetic Checklist: ,, timeout performed, Correct Patient, Correct Site, Correct Laterality, Correct Procedure, Correct Position, site marked, Risks and benefits discussed,  Surgical consent,  Pre-op evaluation,  At surgeon's request and post-op pain management  Laterality: Right  Prep: chloraprep       Needles:  Injection technique: Single-shot  Needle Type: Other     Needle Length: 5cm  Needle Gauge: 22     Additional Needles:   Narrative:  Start time: 09/19/2018 7:10 AM End time: 09/19/2018 7:20 AM Injection made incrementally with aspirations every 5 mL.  Performed by: Personally  Anesthesiologist: Janeece Riggers, MD  Additional Notes: Functioning IV was confirmed and monitors were applied.   Sterile prep and drape,hand hygiene and sterile gloves were used. Relevant anatomy identified, needle position confirmed, local anesthetic spread ., vascular puncture avoided. Negative aspiration and negative test dose prior to incremental administration of local anesthetic. The patient tolerated the procedure well.

## 2018-09-19 NOTE — Discharge Instructions (Signed)
Regional Anesthesia Blocks ? ?1. Numbness or the inability to move the "blocked" extremity may last from 3-48 hours after placement. The length of time depends on the medication injected and your individual response to the medication. If the numbness is not going away after 48 hours, call your surgeon. ? ?2. The extremity that is blocked will need to be protected until the numbness is gone and the  Strength has returned. Because you cannot feel it, you will need to take extra care to avoid injury. Because it may be weak, you may have difficulty moving it or using it. You may not know what position it is in without looking at it while the block is in effect. ? ?3. For blocks in the legs and feet, returning to weight bearing and walking needs to be done carefully. You will need to wait until the numbness is entirely gone and the strength has returned. You should be able to move your leg and foot normally before you try and bear weight or walk. You will need someone to be with you when you first try to ensure you do not fall and possibly risk injury. ? ?4. Bruising and tenderness at the needle site are common side effects and will resolve in a few days. ? ?5. Persistent numbness or new problems with movement should be communicated to the surgeon or the [facility] ([phone])/ [facility] ([phone]).  ? ?Post Anesthesia Home Care Instructions ? ?Activity: ?Get plenty of rest for the remainder of the day. A responsible individual must stay with you for 24 hours following the procedure.  ?For the next 24 hours, DO NOT: ?-Drive a car ?-Operate machinery ?-Drink alcoholic beverages ?-Take any medication unless instructed by your physician ?-Make any legal decisions or sign important papers. ? ?Meals: ?Start with liquid foods such as gelatin or soup. Progress to regular foods as tolerated. Avoid greasy, spicy, heavy foods. If nausea and/or vomiting occur, drink only clear liquids until the  nausea and/or vomiting subsides. Call your physician if vomiting continues. ? ?Special Instructions/Symptoms: ?Your throat may feel dry or sore from the anesthesia or the breathing tube placed in your throat during surgery. If this causes discomfort, gargle with warm salt water. The discomfort should disappear within 24 hours. ? ?If you had a scopolamine patch placed behind your ear for the management of post- operative nausea and/or vomiting: ? ?1. The medication in the patch is effective for 72 hours, after which it should be removed.  Wrap patch in a tissue and discard in the trash. Wash hands thoroughly with soap and water. ?2. You may remove the patch earlier than 72 hours if you experience unpleasant side effects which may include dry mouth, dizziness or visual disturbances. ?3. Avoid touching the patch. Wash your hands with soap and water after contact with the patch. ?    ?

## 2018-09-19 NOTE — Interval H&P Note (Signed)
History and Physical Interval Note:  09/19/2018 6:32 AM  [name]  has presented today for surgery, with the diagnosis of [place]  The various methods of treatment have been discussed with the patient and family. After consideration of risks, benefits and other options for treatment, the patient has consented to  Procedure(s): KNEE ARTHROSCOPY WITH LATERAL MENISECTOMY, CHONDROPLASTY (Right) as a surgical intervention .  The patient's history has been reviewed, patient examined, no change in status, stable for surgery.  I have reviewed the patient's chart and labs.  Questions were answered to the patient's satisfaction.     Lorn Junes

## 2018-09-19 NOTE — Addendum Note (Signed)
Addendum  created 09/19/18 1223 by Janeece Riggers, MD   Child order released for a procedure order, Intraprocedure Blocks edited, Intraprocedure Meds edited, Sign clinical note

## 2018-09-19 NOTE — Anesthesia Postprocedure Evaluation (Signed)
Anesthesia Post Note  Patient: [name]  Procedure(s) Performed: KNEE ARTHROSCOPY WITH LATERAL MENISECTOMY AND MEDIAL MENISECTOMY, CHONDROPLASTY (Right Knee)     Patient location during evaluation: PACU Anesthesia Type: General Level of consciousness: awake and alert Pain management: pain level controlled Vital Signs Assessment: post-procedure vital signs reviewed and stable Respiratory status: spontaneous breathing, nonlabored ventilation, respiratory function stable and patient connected to nasal cannula oxygen Cardiovascular status: blood pressure returned to baseline and stable Postop Assessment: no apparent nausea or vomiting Anesthetic complications: no    Last Vitals:  Vitals:   09/19/18 0815 09/19/18 0830  BP: 119/75 103/78  Pulse: 62 (!) 55  Resp: 11 10  Temp:    SpO2: 100% 100%    Last Pain:  Vitals:   09/19/18 0830  TempSrc:   PainSc: 0-No pain        RLE Motor Response: Purposeful movement (09/19/18 0830) RLE Sensation: Decreased (09/19/18 0830)      Jamarien Rodkey

## 2018-09-19 NOTE — Transfer of Care (Signed)
Immediate Anesthesia Transfer of Care Note  Patient: [name]  Procedure(s) Performed: KNEE ARTHROSCOPY WITH LATERAL MENISECTOMY AND MEDIAL MENISECTOMY, CHONDROPLASTY (Right Knee)  Patient Location: PACU  Anesthesia Type:General  Level of Consciousness: awake, alert  and oriented  Airway & Oxygen Therapy: Patient Spontanous Breathing and Patient connected to face mask oxygen  Post-op Assessment: Report given to RN and Post -op Vital signs reviewed and stable  Post vital signs: Reviewed and stable  Last Vitals:  Vitals Value Taken Time  BP 119/75 09/19/2018  8:15 AM  Temp    Pulse 60 09/19/2018  8:16 AM  Resp 10 09/19/2018  8:16 AM  SpO2 100 % 09/19/2018  8:16 AM  Vitals shown include unvalidated device data.  Last Pain:  Vitals:   09/19/18 0648  TempSrc: Oral  PainSc: 6          Complications: No apparent anesthesia complications

## 2018-09-20 ENCOUNTER — Encounter (HOSPITAL_BASED_OUTPATIENT_CLINIC_OR_DEPARTMENT_OTHER): Payer: Self-pay | Admitting: Orthopedic Surgery

## 2018-09-26 DIAGNOSIS — S83281D Other tear of lateral meniscus, current injury, right knee, subsequent encounter: Secondary | ICD-10-CM | POA: Diagnosis not present

## 2018-09-27 NOTE — Addendum Note (Signed)
Addendum  created 09/27/18 0859 by Janeece Riggers, MD   Intraprocedure Blocks edited, Sign clinical note

## 2018-09-27 NOTE — Addendum Note (Signed)
Addendum  created 09/27/18 2122 by Janeece Riggers, MD   Intraprocedure Blocks edited, Sign clinical note

## 2018-10-06 DIAGNOSIS — G4700 Insomnia, unspecified: Secondary | ICD-10-CM | POA: Diagnosis not present

## 2018-10-06 DIAGNOSIS — Z23 Encounter for immunization: Secondary | ICD-10-CM | POA: Diagnosis not present

## 2018-10-06 DIAGNOSIS — F329 Major depressive disorder, single episode, unspecified: Secondary | ICD-10-CM | POA: Diagnosis not present

## 2018-10-06 DIAGNOSIS — Z720 Tobacco use: Secondary | ICD-10-CM | POA: Diagnosis not present

## 2018-10-06 DIAGNOSIS — Z1239 Encounter for other screening for malignant neoplasm of breast: Secondary | ICD-10-CM | POA: Diagnosis not present

## 2018-10-06 DIAGNOSIS — E785 Hyperlipidemia, unspecified: Secondary | ICD-10-CM | POA: Diagnosis not present

## 2018-10-11 ENCOUNTER — Other Ambulatory Visit (HOSPITAL_BASED_OUTPATIENT_CLINIC_OR_DEPARTMENT_OTHER): Payer: Self-pay | Admitting: Physician Assistant

## 2018-10-11 DIAGNOSIS — M79641 Pain in right hand: Secondary | ICD-10-CM | POA: Diagnosis not present

## 2018-10-11 DIAGNOSIS — M25561 Pain in right knee: Secondary | ICD-10-CM | POA: Diagnosis not present

## 2018-10-11 DIAGNOSIS — M79672 Pain in left foot: Secondary | ICD-10-CM | POA: Diagnosis not present

## 2018-10-11 DIAGNOSIS — G894 Chronic pain syndrome: Secondary | ICD-10-CM | POA: Diagnosis not present

## 2018-10-11 DIAGNOSIS — Z1239 Encounter for other screening for malignant neoplasm of breast: Secondary | ICD-10-CM

## 2018-11-09 DIAGNOSIS — Z79899 Other long term (current) drug therapy: Secondary | ICD-10-CM | POA: Diagnosis not present

## 2018-11-09 DIAGNOSIS — Z5181 Encounter for therapeutic drug level monitoring: Secondary | ICD-10-CM | POA: Diagnosis not present

## 2018-11-09 DIAGNOSIS — M25561 Pain in right knee: Secondary | ICD-10-CM | POA: Diagnosis not present

## 2018-11-09 DIAGNOSIS — M79672 Pain in left foot: Secondary | ICD-10-CM | POA: Diagnosis not present

## 2018-11-09 DIAGNOSIS — M79641 Pain in right hand: Secondary | ICD-10-CM | POA: Diagnosis not present

## 2018-11-09 DIAGNOSIS — G894 Chronic pain syndrome: Secondary | ICD-10-CM | POA: Diagnosis not present

## 2018-11-30 DIAGNOSIS — M7701 Medial epicondylitis, right elbow: Secondary | ICD-10-CM | POA: Diagnosis not present

## 2018-11-30 DIAGNOSIS — M5412 Radiculopathy, cervical region: Secondary | ICD-10-CM | POA: Diagnosis not present

## 2018-11-30 DIAGNOSIS — M7702 Medial epicondylitis, left elbow: Secondary | ICD-10-CM | POA: Diagnosis not present

## 2018-11-30 DIAGNOSIS — S83281D Other tear of lateral meniscus, current injury, right knee, subsequent encounter: Secondary | ICD-10-CM | POA: Diagnosis not present

## 2018-12-11 DIAGNOSIS — Z6825 Body mass index (BMI) 25.0-25.9, adult: Secondary | ICD-10-CM | POA: Diagnosis not present

## 2018-12-11 DIAGNOSIS — M79602 Pain in left arm: Secondary | ICD-10-CM | POA: Diagnosis not present

## 2018-12-11 DIAGNOSIS — M79601 Pain in right arm: Secondary | ICD-10-CM | POA: Diagnosis not present

## 2018-12-28 DIAGNOSIS — M4802 Spinal stenosis, cervical region: Secondary | ICD-10-CM | POA: Diagnosis not present

## 2018-12-28 DIAGNOSIS — M79601 Pain in right arm: Secondary | ICD-10-CM | POA: Diagnosis not present

## 2019-01-02 DIAGNOSIS — G5623 Lesion of ulnar nerve, bilateral upper limbs: Secondary | ICD-10-CM | POA: Diagnosis not present

## 2019-01-02 DIAGNOSIS — G5603 Carpal tunnel syndrome, bilateral upper limbs: Secondary | ICD-10-CM | POA: Diagnosis not present

## 2019-01-04 DIAGNOSIS — M79641 Pain in right hand: Secondary | ICD-10-CM | POA: Diagnosis not present

## 2019-01-04 DIAGNOSIS — M25561 Pain in right knee: Secondary | ICD-10-CM | POA: Diagnosis not present

## 2019-01-04 DIAGNOSIS — G894 Chronic pain syndrome: Secondary | ICD-10-CM | POA: Diagnosis not present

## 2019-01-04 DIAGNOSIS — M79672 Pain in left foot: Secondary | ICD-10-CM | POA: Diagnosis not present

## 2019-01-10 DIAGNOSIS — G5623 Lesion of ulnar nerve, bilateral upper limbs: Secondary | ICD-10-CM | POA: Diagnosis not present

## 2019-01-22 DIAGNOSIS — M25521 Pain in right elbow: Secondary | ICD-10-CM | POA: Diagnosis not present

## 2019-01-22 DIAGNOSIS — G5603 Carpal tunnel syndrome, bilateral upper limbs: Secondary | ICD-10-CM | POA: Diagnosis not present

## 2019-01-22 DIAGNOSIS — G5622 Lesion of ulnar nerve, left upper limb: Secondary | ICD-10-CM | POA: Diagnosis not present

## 2019-01-22 DIAGNOSIS — M25522 Pain in left elbow: Secondary | ICD-10-CM | POA: Diagnosis not present

## 2019-01-24 DIAGNOSIS — G5622 Lesion of ulnar nerve, left upper limb: Secondary | ICD-10-CM | POA: Diagnosis not present

## 2019-01-24 DIAGNOSIS — G5602 Carpal tunnel syndrome, left upper limb: Secondary | ICD-10-CM | POA: Diagnosis not present

## 2019-01-30 DIAGNOSIS — G5603 Carpal tunnel syndrome, bilateral upper limbs: Secondary | ICD-10-CM | POA: Diagnosis not present

## 2019-01-30 DIAGNOSIS — G5622 Lesion of ulnar nerve, left upper limb: Secondary | ICD-10-CM | POA: Diagnosis not present

## 2019-01-30 DIAGNOSIS — M25521 Pain in right elbow: Secondary | ICD-10-CM | POA: Diagnosis not present

## 2019-01-30 DIAGNOSIS — M25522 Pain in left elbow: Secondary | ICD-10-CM | POA: Diagnosis not present

## 2019-02-01 DIAGNOSIS — H8110 Benign paroxysmal vertigo, unspecified ear: Secondary | ICD-10-CM | POA: Diagnosis not present

## 2019-02-01 DIAGNOSIS — R42 Dizziness and giddiness: Secondary | ICD-10-CM | POA: Diagnosis not present

## 2019-03-01 DIAGNOSIS — G894 Chronic pain syndrome: Secondary | ICD-10-CM | POA: Diagnosis not present

## 2019-03-16 DIAGNOSIS — M545 Low back pain: Secondary | ICD-10-CM | POA: Diagnosis not present

## 2019-03-16 DIAGNOSIS — R350 Frequency of micturition: Secondary | ICD-10-CM | POA: Diagnosis not present

## 2019-03-19 DIAGNOSIS — M545 Low back pain: Secondary | ICD-10-CM | POA: Diagnosis not present

## 2019-03-27 DIAGNOSIS — K625 Hemorrhage of anus and rectum: Secondary | ICD-10-CM | POA: Diagnosis not present

## 2019-03-29 DIAGNOSIS — M79672 Pain in left foot: Secondary | ICD-10-CM | POA: Diagnosis not present

## 2019-03-29 DIAGNOSIS — G894 Chronic pain syndrome: Secondary | ICD-10-CM | POA: Diagnosis not present

## 2019-03-29 DIAGNOSIS — M25561 Pain in right knee: Secondary | ICD-10-CM | POA: Diagnosis not present

## 2019-03-29 DIAGNOSIS — M79641 Pain in right hand: Secondary | ICD-10-CM | POA: Diagnosis not present

## 2019-04-06 DIAGNOSIS — K5730 Diverticulosis of large intestine without perforation or abscess without bleeding: Secondary | ICD-10-CM | POA: Diagnosis not present

## 2019-04-06 DIAGNOSIS — R1032 Left lower quadrant pain: Secondary | ICD-10-CM | POA: Diagnosis not present

## 2019-04-06 DIAGNOSIS — K635 Polyp of colon: Secondary | ICD-10-CM | POA: Diagnosis not present

## 2019-04-06 DIAGNOSIS — K625 Hemorrhage of anus and rectum: Secondary | ICD-10-CM | POA: Diagnosis not present

## 2019-04-11 DIAGNOSIS — K635 Polyp of colon: Secondary | ICD-10-CM | POA: Diagnosis not present

## 2019-04-12 DIAGNOSIS — R42 Dizziness and giddiness: Secondary | ICD-10-CM | POA: Diagnosis not present

## 2019-04-12 DIAGNOSIS — K859 Acute pancreatitis without necrosis or infection, unspecified: Secondary | ICD-10-CM | POA: Diagnosis not present

## 2019-04-12 DIAGNOSIS — Z79899 Other long term (current) drug therapy: Secondary | ICD-10-CM | POA: Diagnosis not present

## 2019-04-24 DIAGNOSIS — M79642 Pain in left hand: Secondary | ICD-10-CM | POA: Diagnosis not present

## 2019-04-24 DIAGNOSIS — M79641 Pain in right hand: Secondary | ICD-10-CM | POA: Diagnosis not present

## 2019-04-24 DIAGNOSIS — M25561 Pain in right knee: Secondary | ICD-10-CM | POA: Diagnosis not present

## 2019-04-24 DIAGNOSIS — G894 Chronic pain syndrome: Secondary | ICD-10-CM | POA: Diagnosis not present

## 2019-04-30 DIAGNOSIS — G5621 Lesion of ulnar nerve, right upper limb: Secondary | ICD-10-CM | POA: Diagnosis not present

## 2019-04-30 DIAGNOSIS — G5601 Carpal tunnel syndrome, right upper limb: Secondary | ICD-10-CM | POA: Diagnosis not present

## 2019-05-22 DIAGNOSIS — M79641 Pain in right hand: Secondary | ICD-10-CM | POA: Diagnosis not present

## 2019-05-22 DIAGNOSIS — G894 Chronic pain syndrome: Secondary | ICD-10-CM | POA: Diagnosis not present

## 2019-05-22 DIAGNOSIS — M25561 Pain in right knee: Secondary | ICD-10-CM | POA: Diagnosis not present

## 2019-05-22 DIAGNOSIS — M79672 Pain in left foot: Secondary | ICD-10-CM | POA: Diagnosis not present

## 2019-05-23 DIAGNOSIS — Z79899 Other long term (current) drug therapy: Secondary | ICD-10-CM | POA: Diagnosis not present

## 2019-05-23 DIAGNOSIS — Z5181 Encounter for therapeutic drug level monitoring: Secondary | ICD-10-CM | POA: Diagnosis not present

## 2019-06-12 DIAGNOSIS — M79641 Pain in right hand: Secondary | ICD-10-CM | POA: Diagnosis not present

## 2019-06-12 DIAGNOSIS — M79672 Pain in left foot: Secondary | ICD-10-CM | POA: Diagnosis not present

## 2019-06-12 DIAGNOSIS — G894 Chronic pain syndrome: Secondary | ICD-10-CM | POA: Diagnosis not present

## 2019-06-12 DIAGNOSIS — M25561 Pain in right knee: Secondary | ICD-10-CM | POA: Diagnosis not present

## 2019-07-11 DIAGNOSIS — H5213 Myopia, bilateral: Secondary | ICD-10-CM | POA: Diagnosis not present

## 2019-07-11 DIAGNOSIS — H16223 Keratoconjunctivitis sicca, not specified as Sjogren's, bilateral: Secondary | ICD-10-CM | POA: Diagnosis not present

## 2019-07-11 DIAGNOSIS — H524 Presbyopia: Secondary | ICD-10-CM | POA: Diagnosis not present

## 2019-07-23 DIAGNOSIS — Z23 Encounter for immunization: Secondary | ICD-10-CM | POA: Diagnosis not present

## 2019-07-23 DIAGNOSIS — Z124 Encounter for screening for malignant neoplasm of cervix: Secondary | ICD-10-CM | POA: Diagnosis not present

## 2019-07-23 DIAGNOSIS — E785 Hyperlipidemia, unspecified: Secondary | ICD-10-CM | POA: Diagnosis not present

## 2019-07-23 DIAGNOSIS — G4700 Insomnia, unspecified: Secondary | ICD-10-CM | POA: Diagnosis not present

## 2019-07-23 DIAGNOSIS — Z13220 Encounter for screening for lipoid disorders: Secondary | ICD-10-CM | POA: Diagnosis not present

## 2019-07-23 DIAGNOSIS — Z131 Encounter for screening for diabetes mellitus: Secondary | ICD-10-CM | POA: Diagnosis not present

## 2019-07-23 DIAGNOSIS — Z1239 Encounter for other screening for malignant neoplasm of breast: Secondary | ICD-10-CM | POA: Diagnosis not present

## 2019-07-23 DIAGNOSIS — G894 Chronic pain syndrome: Secondary | ICD-10-CM | POA: Diagnosis not present

## 2019-07-23 DIAGNOSIS — Z0000 Encounter for general adult medical examination without abnormal findings: Secondary | ICD-10-CM | POA: Diagnosis not present

## 2019-07-24 ENCOUNTER — Other Ambulatory Visit (HOSPITAL_BASED_OUTPATIENT_CLINIC_OR_DEPARTMENT_OTHER): Payer: Self-pay | Admitting: Physician Assistant

## 2019-07-24 DIAGNOSIS — Z1239 Encounter for other screening for malignant neoplasm of breast: Secondary | ICD-10-CM

## 2019-07-31 DIAGNOSIS — G894 Chronic pain syndrome: Secondary | ICD-10-CM | POA: Diagnosis not present

## 2019-07-31 DIAGNOSIS — M79672 Pain in left foot: Secondary | ICD-10-CM | POA: Diagnosis not present

## 2019-07-31 DIAGNOSIS — M25561 Pain in right knee: Secondary | ICD-10-CM | POA: Diagnosis not present

## 2019-07-31 DIAGNOSIS — M79641 Pain in right hand: Secondary | ICD-10-CM | POA: Diagnosis not present

## 2019-08-28 DIAGNOSIS — Z79899 Other long term (current) drug therapy: Secondary | ICD-10-CM | POA: Diagnosis not present

## 2019-08-28 DIAGNOSIS — M25561 Pain in right knee: Secondary | ICD-10-CM | POA: Diagnosis not present

## 2019-08-28 DIAGNOSIS — M79641 Pain in right hand: Secondary | ICD-10-CM | POA: Diagnosis not present

## 2019-08-28 DIAGNOSIS — M79672 Pain in left foot: Secondary | ICD-10-CM | POA: Diagnosis not present

## 2019-08-28 DIAGNOSIS — G894 Chronic pain syndrome: Secondary | ICD-10-CM | POA: Diagnosis not present

## 2019-08-28 DIAGNOSIS — Z5181 Encounter for therapeutic drug level monitoring: Secondary | ICD-10-CM | POA: Diagnosis not present

## 2019-09-13 ENCOUNTER — Ambulatory Visit (HOSPITAL_BASED_OUTPATIENT_CLINIC_OR_DEPARTMENT_OTHER): Payer: Medicare HMO

## 2019-10-23 DIAGNOSIS — M533 Sacrococcygeal disorders, not elsewhere classified: Secondary | ICD-10-CM | POA: Diagnosis not present

## 2019-10-23 DIAGNOSIS — M79641 Pain in right hand: Secondary | ICD-10-CM | POA: Diagnosis not present

## 2019-10-23 DIAGNOSIS — G894 Chronic pain syndrome: Secondary | ICD-10-CM | POA: Diagnosis not present

## 2019-10-23 DIAGNOSIS — M25561 Pain in right knee: Secondary | ICD-10-CM | POA: Diagnosis not present

## 2019-10-30 DIAGNOSIS — Z03818 Encounter for observation for suspected exposure to other biological agents ruled out: Secondary | ICD-10-CM | POA: Diagnosis not present

## 2019-10-30 DIAGNOSIS — J019 Acute sinusitis, unspecified: Secondary | ICD-10-CM | POA: Diagnosis not present

## 2019-11-27 DIAGNOSIS — H5213 Myopia, bilateral: Secondary | ICD-10-CM | POA: Diagnosis not present

## 2019-12-13 DIAGNOSIS — Z5181 Encounter for therapeutic drug level monitoring: Secondary | ICD-10-CM | POA: Diagnosis not present

## 2019-12-13 DIAGNOSIS — Z79899 Other long term (current) drug therapy: Secondary | ICD-10-CM | POA: Diagnosis not present

## 2019-12-18 DIAGNOSIS — M79672 Pain in left foot: Secondary | ICD-10-CM | POA: Diagnosis not present

## 2019-12-18 DIAGNOSIS — M25561 Pain in right knee: Secondary | ICD-10-CM | POA: Diagnosis not present

## 2019-12-18 DIAGNOSIS — G894 Chronic pain syndrome: Secondary | ICD-10-CM | POA: Diagnosis not present

## 2019-12-18 DIAGNOSIS — M79641 Pain in right hand: Secondary | ICD-10-CM | POA: Diagnosis not present

## 2020-02-12 DIAGNOSIS — M79641 Pain in right hand: Secondary | ICD-10-CM | POA: Diagnosis not present

## 2020-02-12 DIAGNOSIS — M79672 Pain in left foot: Secondary | ICD-10-CM | POA: Diagnosis not present

## 2020-02-12 DIAGNOSIS — M25561 Pain in right knee: Secondary | ICD-10-CM | POA: Diagnosis not present

## 2020-02-12 DIAGNOSIS — G894 Chronic pain syndrome: Secondary | ICD-10-CM | POA: Diagnosis not present

## 2020-03-11 DIAGNOSIS — Z79899 Other long term (current) drug therapy: Secondary | ICD-10-CM | POA: Diagnosis not present

## 2020-03-11 DIAGNOSIS — Z5181 Encounter for therapeutic drug level monitoring: Secondary | ICD-10-CM | POA: Diagnosis not present

## 2020-03-11 DIAGNOSIS — G894 Chronic pain syndrome: Secondary | ICD-10-CM | POA: Diagnosis not present

## 2020-03-11 DIAGNOSIS — M79641 Pain in right hand: Secondary | ICD-10-CM | POA: Diagnosis not present

## 2020-03-11 DIAGNOSIS — M79672 Pain in left foot: Secondary | ICD-10-CM | POA: Diagnosis not present

## 2020-03-11 DIAGNOSIS — M25561 Pain in right knee: Secondary | ICD-10-CM | POA: Diagnosis not present

## 2020-03-13 DIAGNOSIS — G894 Chronic pain syndrome: Secondary | ICD-10-CM | POA: Diagnosis not present

## 2020-03-25 DIAGNOSIS — M79605 Pain in left leg: Secondary | ICD-10-CM | POA: Diagnosis not present

## 2020-03-25 DIAGNOSIS — M79604 Pain in right leg: Secondary | ICD-10-CM | POA: Diagnosis not present

## 2020-04-06 DIAGNOSIS — J00 Acute nasopharyngitis [common cold]: Secondary | ICD-10-CM | POA: Diagnosis not present

## 2020-04-06 DIAGNOSIS — Z03818 Encounter for observation for suspected exposure to other biological agents ruled out: Secondary | ICD-10-CM | POA: Diagnosis not present

## 2020-04-06 DIAGNOSIS — R519 Headache, unspecified: Secondary | ICD-10-CM | POA: Diagnosis not present

## 2020-04-06 DIAGNOSIS — B349 Viral infection, unspecified: Secondary | ICD-10-CM | POA: Diagnosis not present

## 2020-04-07 DIAGNOSIS — M79604 Pain in right leg: Secondary | ICD-10-CM | POA: Diagnosis not present

## 2020-04-07 DIAGNOSIS — M79605 Pain in left leg: Secondary | ICD-10-CM | POA: Diagnosis not present

## 2020-04-30 DIAGNOSIS — M533 Sacrococcygeal disorders, not elsewhere classified: Secondary | ICD-10-CM | POA: Diagnosis not present

## 2020-04-30 DIAGNOSIS — G894 Chronic pain syndrome: Secondary | ICD-10-CM | POA: Diagnosis not present

## 2020-04-30 DIAGNOSIS — M79672 Pain in left foot: Secondary | ICD-10-CM | POA: Diagnosis not present

## 2020-04-30 DIAGNOSIS — M79641 Pain in right hand: Secondary | ICD-10-CM | POA: Diagnosis not present

## 2020-07-31 DIAGNOSIS — G5621 Lesion of ulnar nerve, right upper limb: Secondary | ICD-10-CM | POA: Diagnosis not present

## 2020-07-31 DIAGNOSIS — G5603 Carpal tunnel syndrome, bilateral upper limbs: Secondary | ICD-10-CM | POA: Diagnosis not present

## 2020-07-31 DIAGNOSIS — M19021 Primary osteoarthritis, right elbow: Secondary | ICD-10-CM | POA: Diagnosis not present

## 2020-07-31 DIAGNOSIS — M25522 Pain in left elbow: Secondary | ICD-10-CM | POA: Diagnosis not present

## 2020-07-31 DIAGNOSIS — M25521 Pain in right elbow: Secondary | ICD-10-CM | POA: Diagnosis not present

## 2020-07-31 DIAGNOSIS — G5622 Lesion of ulnar nerve, left upper limb: Secondary | ICD-10-CM | POA: Diagnosis not present

## 2020-07-31 DIAGNOSIS — M19029 Primary osteoarthritis, unspecified elbow: Secondary | ICD-10-CM | POA: Diagnosis not present

## 2020-08-05 DIAGNOSIS — E785 Hyperlipidemia, unspecified: Secondary | ICD-10-CM | POA: Diagnosis not present

## 2020-08-05 DIAGNOSIS — Z720 Tobacco use: Secondary | ICD-10-CM | POA: Diagnosis not present

## 2020-08-05 DIAGNOSIS — Z124 Encounter for screening for malignant neoplasm of cervix: Secondary | ICD-10-CM | POA: Diagnosis not present

## 2020-08-05 DIAGNOSIS — Z8719 Personal history of other diseases of the digestive system: Secondary | ICD-10-CM | POA: Diagnosis not present

## 2020-08-05 DIAGNOSIS — G894 Chronic pain syndrome: Secondary | ICD-10-CM | POA: Diagnosis not present

## 2020-08-05 DIAGNOSIS — R8290 Unspecified abnormal findings in urine: Secondary | ICD-10-CM | POA: Diagnosis not present

## 2020-08-05 DIAGNOSIS — Z0000 Encounter for general adult medical examination without abnormal findings: Secondary | ICD-10-CM | POA: Diagnosis not present

## 2020-08-18 ENCOUNTER — Other Ambulatory Visit: Payer: Self-pay

## 2020-08-18 DIAGNOSIS — G5621 Lesion of ulnar nerve, right upper limb: Secondary | ICD-10-CM | POA: Diagnosis not present

## 2020-08-18 DIAGNOSIS — Z0189 Encounter for other specified special examinations: Secondary | ICD-10-CM | POA: Diagnosis not present

## 2020-08-18 DIAGNOSIS — M868X8 Other osteomyelitis, other site: Secondary | ICD-10-CM | POA: Diagnosis not present

## 2020-08-18 DIAGNOSIS — M19029 Primary osteoarthritis, unspecified elbow: Secondary | ICD-10-CM | POA: Diagnosis not present

## 2020-08-18 DIAGNOSIS — M19021 Primary osteoarthritis, right elbow: Secondary | ICD-10-CM | POA: Diagnosis not present

## 2020-08-18 DIAGNOSIS — M7989 Other specified soft tissue disorders: Secondary | ICD-10-CM | POA: Diagnosis not present

## 2020-08-18 DIAGNOSIS — M24021 Loose body in right elbow: Secondary | ICD-10-CM | POA: Diagnosis not present

## 2020-08-19 ENCOUNTER — Other Ambulatory Visit: Payer: Self-pay | Admitting: Family Medicine

## 2020-08-19 DIAGNOSIS — Z720 Tobacco use: Secondary | ICD-10-CM

## 2020-08-21 DIAGNOSIS — G5621 Lesion of ulnar nerve, right upper limb: Secondary | ICD-10-CM | POA: Diagnosis not present

## 2020-08-21 DIAGNOSIS — M25521 Pain in right elbow: Secondary | ICD-10-CM | POA: Diagnosis not present

## 2020-09-01 ENCOUNTER — Telehealth (HOSPITAL_COMMUNITY): Payer: Self-pay | Admitting: Radiology

## 2020-09-01 ENCOUNTER — Ambulatory Visit (INDEPENDENT_AMBULATORY_CARE_PROVIDER_SITE_OTHER): Payer: Medicare HMO | Admitting: Internal Medicine

## 2020-09-01 ENCOUNTER — Encounter: Payer: Self-pay | Admitting: Internal Medicine

## 2020-09-01 ENCOUNTER — Other Ambulatory Visit: Payer: Self-pay

## 2020-09-01 ENCOUNTER — Telehealth: Payer: Self-pay | Admitting: *Deleted

## 2020-09-01 VITALS — BP 110/77 | HR 78 | Temp 98.0°F | Ht 66.0 in | Wt 138.0 lb

## 2020-09-01 DIAGNOSIS — M869 Osteomyelitis, unspecified: Secondary | ICD-10-CM | POA: Insufficient documentation

## 2020-09-01 DIAGNOSIS — Z23 Encounter for immunization: Secondary | ICD-10-CM

## 2020-09-01 DIAGNOSIS — M109 Gout, unspecified: Secondary | ICD-10-CM | POA: Insufficient documentation

## 2020-09-01 LAB — COMPREHENSIVE METABOLIC PANEL
CO2: 27 mmol/L (ref 20–32)
Chloride: 103 mmol/L (ref 98–110)
Creat: 0.89 mg/dL (ref 0.50–1.05)
Total Bilirubin: 0.5 mg/dL (ref 0.2–1.2)

## 2020-09-01 LAB — CBC WITH DIFFERENTIAL/PLATELET: Neutrophils Relative %: 58.2 %

## 2020-09-01 NOTE — Patient Instructions (Signed)
Thank you for coming to see me today. It was a pleasure.   Today we talked about:  -- the pathology from your surgery showed osteomyelitis which is an infection in your bone -- we typically treat this infection for several weeks if there is concern that any infected bone tissue remains -- I would like to treat you with 2 antibiotics for "empiric" coverage since there were not any cultures to guide [place].  This is not uncommon for [place] to do -- once you get a PICC line placed, we will plan to do ceftriaxone and daptomycin.  Both can be given just once per day but I will check some baseline labs first    Please follow-up with me in about 5 weeks  If you have any questions or concerns, please do not hesitate to call the office at [phone].  Take Care,   Jule Ser, DO

## 2020-09-01 NOTE — Telephone Encounter (Signed)
Called pt to schedule PICC placement, no answer, left VM. JM

## 2020-09-01 NOTE — Telephone Encounter (Signed)
Thanks Michelle

## 2020-09-01 NOTE — Progress Notes (Addendum)
[facility]ious [facility]  Reason for Consult: osteomyelitis Referring Provider: Dr Burney Gauze  HPI:  [name] is a 59 y.o. female who presents to clinic for further evaluation of right elbow osteomyelitis.     Pt referred from her orthopedic surgery office.  She is s/p right elbow subcutaneous ulnar nerve transfer and right elbow arthotomy with Dr Burney Gauze.  It was felt that her presentation was due to gout arthritis however pathology specimens from 2 sites were c/w osteomyelitis.  Cultures obtained are negative.  She was started on Bactrim last Thursday.  She began having right elbow pain about 8 weeks ago that was thought to be from arthritis or gout.  She has a hx of prior right elbow surgery about 1 year ago but no recent trauma.  Prior to 8 weeks ago she was feeling fine.  The symptoms progressed so she was scheduled for surgery on 08/18/20 which she underwent without complications.  About 3 days prior to her surgery, the elbow became red, hot, and swollen.  Nothing provided relief and was worse with movement and palpation.  She might have had a fever.  She had chills and nausea.  She was told that this was thoroughly irrigated in the OR and there is no hardware.  Patient's Medications  New Prescriptions   No medications on file  Previous Medications   ALBUTEROL (VENTOLIN HFA) 108 (90 BASE) MCG/ACT INHALER       BACLOFEN (LIORESAL) 10 MG TABLET    Take 10 mg by mouth at bedtime.    CYANOCOBALAMIN 2000 MCG TABLET    Take 2,000 mcg by mouth daily.    CYCLOBENZAPRINE (FLEXERIL) 10 MG TABLET    Take 10 mg by mouth 3 (three) times daily as needed for muscle spasms. PRN   DOCUSATE SODIUM (COLACE) 100 MG CAPSULE    Take 1 capsule (100 mg total) by mouth 2 (two) times daily.   MELOXICAM (MOBIC) 15 MG TABLET    Take 1 tablet (15 mg total) by mouth daily.   MULTIPLE VITAMINS-MINERALS (MULTIVITAMIN PO)    Take 1 tablet by mouth daily.    NALOXONE (NARCAN) NASAL SPRAY 4  MG/0.1 ML    Place into the nose.   OMEGA-3 FATTY ACIDS (FISH OIL) 1000 MG CAPS    Take 1,000 mg by mouth daily.    ONDANSETRON (ZOFRAN) 4 MG TABLET    Take 1 tablet (4 mg total) by mouth every 8 (eight) hours as needed for nausea or vomiting.   OXYCODONE-ACETAMINOPHEN (PERCOCET/ROXICET) 5-325 MG TABLET    postop   ROPINIROLE (REQUIP) 0.25 MG TABLET    Take 2 tablets by mouth at bedtime.   SULFAMETHOXAZOLE-TRIMETHOPRIM (BACTRIM DS) 800-160 MG TABLET    Take 1 tablet by mouth 2 (two) times daily.   SUMATRIPTAN (IMITREX) 100 MG TABLET    Take 100 mg by mouth every 2 (two) hours as needed for migraine.    TOPIRAMATE (TOPAMAX) 100 MG TABLET    Take 100 mg by mouth 2 (two) times daily.   TRAMADOL (ULTRAM) 50 MG TABLET       TRAZODONE (DESYREL) 150 MG TABLET    Take 150 mg by mouth at bedtime.  Modified Medications   No medications on file  Discontinued Medications   BUPRENORPHINE HCL (BELBUCA) 600 MCG FILM    Place 600 mcg inside cheek every 12 (twelve) hours.   BUPROPION (WELLBUTRIN XL) 150 MG 24 HR TABLET  Take 450 mg by mouth daily.    FUROSEMIDE (LASIX) 40 MG TABLET    Take 40 mg by mouth every other day.   MECLIZINE (ANTIVERT) 25 MG TABLET    Take 25 mg by mouth 3 (three) times daily as needed for dizziness.   OXYCODONE-ACETAMINOPHEN (PERCOCET) 10-325 MG TABLET    Take 1 tablet by mouth every 6 (six) hours as needed for pain.      Past Medical History:  Diagnosis Date  . Alcoholic pancreatitis 72/0947   01/15/13  . Anemia    Reports being borderline  . Anxiety   . Chronic lower back pain   . Chronic pain    treated at [facility]   . Complex tear of lateral meniscus of right knee 09/18/2018  . Depression   . Fibromyalgia   . History of blood transfusion    "related to ORs"  . Migraine    "monthly" (03/25/2015)  . Osteoarthritis    "wrists, knees, back, neck, fingers,  q joint" (03/25/2015)  . Primary localized osteoarthritis of right knee     Social History    Tobacco Use  . Smoking status: Current Every Day Smoker    Packs/day: 0.50    Years: 35.00    Pack years: 17.50    Types: Cigarettes  . Smokeless tobacco: Never Used  . Tobacco comment: no ready quit   Substance Use Topics  . Alcohol use: Yes    Alcohol/week: 0.0 standard drinks    Comment: rare  . Drug use: No    Comment: occassional marijuana    Family History  Problem Relation Age of Onset  . Cancer Mother   . Cancer Sister     Allergies  Allergen Reactions  . Morphine And Related Itching  . Vimovo [Naproxen-Esomeprazole] Swelling    Lip swelling    Review of Systems: Review of Systems  Constitutional: Positive for chills and fever.  HENT: Negative.   Respiratory: Negative.   Cardiovascular: Negative.   Gastrointestinal: Positive for nausea. Negative for abdominal pain, constipation, diarrhea and vomiting.  Musculoskeletal: Positive for joint pain.  Skin: Negative for itching and rash.  All other systems reviewed and are negative.   Objective: Vitals:   09/01/20 0851  BP: 110/77  Pulse: 78  Temp: 98 F (36.7 C)  TempSrc: Oral  SpO2: 98%  Weight: 138 lb (62.6 kg)  Height: _0  (1.676 m)     Body mass index is 22.27 kg/m.  Physical Exam Constitutional:      General: She is not in acute distress.    Appearance: Normal appearance.  HENT:     Right Ear: External ear normal.     Left Ear: External ear normal.     Nose: Nose normal.  Cardiovascular:     Rate and Rhythm: Normal rate and regular rhythm.     Heart sounds: No murmur heard.   Pulmonary:     Effort: Pulmonary effort is normal.  Musculoskeletal:        General: Swelling, tenderness and signs of injury present.     Comments: Right elbow with ecchymoses at surgical site.  Incision appears to be healing well.  There is decreased ROM from her surgery.  No cellulitis or fluctuance.   Neurological:     Mental Status: She is alert.      Pertinent Labs and Microbiology:   CBC Latest  Ref Rng & Units 09/01/2020 09/08/2018 03/13/2015  WBC 3.8 - 10.8 Thousand/uL 7.8 8.1 6.7  Hemoglobin 11.7 - 15.5 g/dL 13.3 12.2 12.0  Hematocrit 35 - 45 % 39.8 40.0 36.4  Platelets 140 - 400 Thousand/uL 269 221 141(L)   CMP Latest Ref Rng & Units 09/01/2020 09/08/2018 03/13/2015  Glucose 65 - 99 mg/dL 102(H) 105(H) 93  BUN 7 - 25 mg/dL _0 Creatinine 0.50 - 1.05 mg/dL 0.89 0.83 0.75  Sodium 135 - 146 mmol/L 138 138 137  Potassium 3.5 - 5.3 mmol/L 4.4 3.6 3.6  Chloride 98 - 110 mmol/L 103 105 98  CO2 20 - 32 mmol/L _1 Calcium 8.6 - 10.4 mg/dL 10.1 9.8 10.0  Total Protein 6.1 - 8.1 g/dL 7.0 7.1 7.0  Total Bilirubin 0.2 - 1.2 mg/dL 0.5 0.6 0.7  Alkaline Phos 38 - 126 U/L - 64 89  AST 10 - 35 U/L _2 ALT 6 - 29 U/L _3 Path report from right elbow tissue = osteomyelitis and synovitis    All pertinent labs/notes reviewed.   Decision making incorporated into the Assessment and Plan.  Assessment and Plan: Osteomyelitis ([place]) She is s/p right elbow arthrotomy for what was thought to be gouty arthritis based on history and OR findings per patient report as well as after discussing with her surgeon.  She was thoroughly irrigated in the OR.  However, her pathology specimen has yielded a diagnosis of osteomyelitis.  There is no micro to guide therapy based on negative cultures and no organisms seen on Gram stain.    Initially, I had intended to treat with prolonged IV antibiotics due to concern for residual bone infection.  However, I have received patient's lab results from her visit.  This was notable for normal WBC, normal ESR, and normal CRP.  I discussed her case with the pathologist who read her report as well as her surgeon, Dr. Burney Gauze.  Although her pathology report was concerning for osteomyelitis, clinically her presentation is not consistent with this.  Her specimen showed acutely inflamed soft tissue and neutrophils present in the bony fragments  submitted, however, this could have represented an inflammatory process involving the tissues and bone fragments that were removed and thoroughly irrigated.  This likely did not involve any cortical bone tissue.  As result, I do not think a prolonged course of IV antibiotics is necessary as my suspicion for residual bone infection is much lower given all the information available.  I discussed this with her surgeon who is in agreement.  We will plan to continue a relatively short course of oral TMP-SMX as potential "mop up" therapy from her surgery.  I will plan to see her in follow-up in about 2 weeks to assess her recovery.  Patient was called and this plan of care was discussed with her.  We will cancel orders for PICC line and home IV antibiotics that were initially ordered.       Raynelle Highland for Infectious Disease [facility] 09/02/2020, 2:08 PM

## 2020-09-01 NOTE — Telephone Encounter (Signed)
PICC with IV antibiotics and Home health per Dr Juleen China. RN left message with IR for PICC scheduling. Will follow for first dose appointment at short stay after PICC placement. RN faxed referral to [facility].  IV antibiotics daptomycin 56m/kg q 24, ceftriaxone 2 gm q 24 hours x 6 weeks; weekly labs CBC, CMP, CK, ESR, CRP.  Will follow up on insurance approval via [facility]. HLandis Gandy RN

## 2020-09-01 NOTE — Assessment & Plan Note (Addendum)
She is s/p right elbow arthrotomy for what was thought to be gouty arthritis based on history and OR findings per patient report as well as after discussing with her surgeon.  She was thoroughly irrigated in the OR.  However, her pathology specimen has yielded a diagnosis of osteomyelitis.  There is no micro to guide therapy based on negative cultures and no organisms seen on Gram stain.    Initially, I had intended to treat with prolonged IV antibiotics due to concern for residual bone infection.  However, I have received patient's lab results from her visit.  This was notable for normal WBC, normal ESR, and normal CRP.  I discussed her case with the pathologist who read her report as well as her surgeon, Dr. Burney Gauze.  Although her pathology report was concerning for osteomyelitis, clinically her presentation is not consistent with this.  Her specimen showed acutely inflamed soft tissue and neutrophils present in the bony fragments submitted, however, this could have represented an inflammatory process involving the tissues and bone fragments that were removed and thoroughly irrigated.  This likely did not involve any cortical bone tissue.  As result, I do not think a prolonged course of IV antibiotics is necessary as my suspicion for residual bone infection is much lower given all the information available.  I discussed this with her surgeon who is in agreement.  We will plan to continue a relatively short course of oral TMP-SMX as potential "mop up" therapy from her surgery.  I will plan to see her in follow-up in about 2 weeks to assess her recovery.  Patient was called and this plan of care was discussed with her.  We will cancel orders for PICC line and home IV antibiotics that were initially ordered.

## 2020-09-02 LAB — CBC WITH DIFFERENTIAL/PLATELET
Absolute Monocytes: 484 cells/uL (ref 200–950)
Basophils Absolute: 101 cells/uL (ref 0–200)
Basophils Relative: 1.3 %
Eosinophils Absolute: 242 cells/uL (ref 15–500)
Eosinophils Relative: 3.1 %
HCT: 39.8 % (ref 35.0–45.0)
Hemoglobin: 13.3 g/dL (ref 11.7–15.5)
Lymphs Abs: 2434 cells/uL (ref 850–3900)
MCH: 30.9 pg (ref 27.0–33.0)
MCHC: 33.4 g/dL (ref 32.0–36.0)
MCV: 92.6 fL (ref 80.0–100.0)
MPV: 9.7 fL (ref 7.5–12.5)
Monocytes Relative: 6.2 %
Neutro Abs: 4540 cells/uL (ref 1500–7800)
Platelets: 269 10*3/uL (ref 140–400)
RBC: 4.3 10*6/uL (ref 3.80–5.10)
RDW: 11.8 % (ref 11.0–15.0)
Total Lymphocyte: 31.2 %
WBC: 7.8 10*3/uL (ref 3.8–10.8)

## 2020-09-02 LAB — SEDIMENTATION RATE: Sed Rate: 2 mm/h (ref 0–30)

## 2020-09-02 LAB — COMPREHENSIVE METABOLIC PANEL
AG Ratio: 2 (calc) (ref 1.0–2.5)
ALT: 14 U/L (ref 6–29)
AST: 21 U/L (ref 10–35)
Albumin: 4.7 g/dL (ref 3.6–5.1)
Alkaline phosphatase (APISO): 98 U/L (ref 37–153)
BUN: 10 mg/dL (ref 7–25)
Calcium: 10.1 mg/dL (ref 8.6–10.4)
Globulin: 2.3 g/dL (calc) (ref 1.9–3.7)
Glucose, Bld: 102 mg/dL — ABNORMAL HIGH (ref 65–99)
Potassium: 4.4 mmol/L (ref 3.5–5.3)
Sodium: 138 mmol/L (ref 135–146)
Total Protein: 7 g/dL (ref 6.1–8.1)

## 2020-09-02 LAB — C-REACTIVE PROTEIN: CRP: 4.3 mg/L (ref <8.0)

## 2020-09-02 LAB — CK: Total CK: 56 U/L (ref 29–143)

## 2020-09-02 NOTE — Telephone Encounter (Signed)
Dr Juleen China spoke with surgeon, decided to cancel PICC and IV antibiotics.  He would like her to follow up the first week of November.  RN cancelled referral to [facility] ([facility]), cancelled PICC appointment Anderson [place]), rescheduled patient to 11/1 10:15. Landis Gandy, RN

## 2020-09-04 ENCOUNTER — Ambulatory Visit (HOSPITAL_COMMUNITY): Payer: Medicare HMO

## 2020-09-18 ENCOUNTER — Ambulatory Visit
Admission: RE | Admit: 2020-09-18 | Discharge: 2020-09-18 | Disposition: A | Discharge status: Home / Self Care | Payer: Medicare HMO | Source: Ambulatory Visit | Attending: Family Medicine | Admitting: Family Medicine

## 2020-09-18 DIAGNOSIS — J432 Centrilobular emphysema: Secondary | ICD-10-CM | POA: Diagnosis not present

## 2020-09-18 DIAGNOSIS — I700 Atherosclerosis of aorta: Secondary | ICD-10-CM | POA: Diagnosis not present

## 2020-09-18 DIAGNOSIS — F17210 Nicotine dependence, cigarettes, uncomplicated: Secondary | ICD-10-CM | POA: Diagnosis not present

## 2020-09-18 DIAGNOSIS — K8689 Other specified diseases of pancreas: Secondary | ICD-10-CM | POA: Diagnosis not present

## 2020-09-18 DIAGNOSIS — Z720 Tobacco use: Secondary | ICD-10-CM

## 2020-09-22 ENCOUNTER — Ambulatory Visit: Payer: Medicare HMO | Admitting: Internal Medicine

## 2020-09-25 NOTE — Progress Notes (Signed)
[facility] for Infectious Disease  Chief Complaint:  Follow up concern for right elbow infection  Subjective: [name] is a 59 y.o. female with PMHx as below who presents to the clinic for follow up of possible right elbow infection. Please see A&P for the details of today's visit and status of the patient's medical problems.   Past Medical History:  Diagnosis Date  . Alcoholic pancreatitis 77/8242   01/15/13  . Anemia    Reports being borderline  . Anxiety   . Chronic lower back pain   . Chronic pain    treated at [facility]   . Complex tear of lateral meniscus of right knee 09/18/2018  . Depression   . Fibromyalgia   . History of blood transfusion    "related to ORs"  . Migraine    "monthly" (03/25/2015)  . Osteoarthritis    "wrists, knees, back, neck, fingers,  q joint" (03/25/2015)  . Primary localized osteoarthritis of right knee     Outpatient Encounter Medications as of 09/26/2020  Medication Sig Note  . albuterol (VENTOLIN HFA) 108 (90 Base) MCG/ACT inhaler    . baclofen (LIORESAL) 10 MG tablet Take 10 mg by mouth at bedtime.  09/01/2020: PRN for headaches  . cyanocobalamin 2000 MCG tablet Take 2,000 mcg by mouth daily.    . meloxicam (MOBIC) 15 MG tablet Take 1 tablet (15 mg total) by mouth daily.   . Multiple Vitamins-Minerals (MULTIVITAMIN PO) Take 1 tablet by mouth daily.    . Omega-3 Fatty Acids (FISH OIL) 1000 MG CAPS Take 1,000 mg by mouth daily.    . ondansetron (ZOFRAN) 4 MG tablet Take 1 tablet (4 mg total) by mouth every 8 (eight) hours as needed for nausea or vomiting. (Patient taking differently: Take 4 mg by mouth every 8 (eight) hours as needed for nausea or vomiting. )   . rOPINIRole (REQUIP) 0.25 MG tablet Take 2 tablets by mouth at bedtime.   . SUMAtriptan (IMITREX) 100 MG tablet Take 100 mg by mouth every 2 (two) hours as needed for migraine.    . traMADol (ULTRAM) 50 MG tablet  09/01/2020: "doesn't really help much"    . traZODone (DESYREL) 150 MG tablet Take 150 mg by mouth at bedtime.   . cyclobenzaprine (FLEXERIL) 10 MG tablet Take 10 mg by mouth 3 (three) times daily as needed for muscle spasms. PRN (Patient not taking: Reported on 09/26/2020)   . docusate sodium (COLACE) 100 MG capsule Take 1 capsule (100 mg total) by mouth 2 (two) times daily. (Patient not taking: Reported on 09/26/2020)   . naloxone (NARCAN) nasal spray 4 mg/0.1 mL Place into the nose. (Patient not taking: Reported on 09/01/2020) 09/01/2020: "it's somewhere, never needed it"  . oxyCODONE-acetaminophen (PERCOCET/ROXICET) 5-325 MG tablet postop (Patient not taking: Reported on 09/26/2020)   . topiramate (TOPAMAX) 100 MG tablet Take 100 mg by mouth 2 (two) times daily. (Patient not taking: Reported on 09/26/2020)   . [DISCONTINUED] sulfamethoxazole-trimethoprim (BACTRIM DS) 800-160 MG tablet Take 1 tablet by mouth 2 (two) times daily. (Patient not taking: Reported on 09/26/2020)    No facility-administered encounter medications on file as of 09/26/2020.    Family History  Problem Relation Age of Onset  . Cancer Mother   . Cancer Sister     Social History   Socioeconomic History  . Marital status: Divorced    Spouse name: Ailey Wessling   . Number of children: 2  .  Years of education: 12+  . Highest education level: Not on file  Occupational History  . Occupation: Disabled  Tobacco Use  . Smoking status: Current Every Day Smoker    Packs/day: 0.50    Years: 35.00    Pack years: 17.50    Types: Cigarettes  . Smokeless tobacco: Never Used  . Tobacco comment: no ready quit   Substance and Sexual Activity  . Alcohol use: Yes    Alcohol/week: 0.0 standard drinks    Comment: rare  . Drug use: No    Comment: occassional marijuana  . Sexual activity: Yes    Partners: Male  Other Topics Concern  . Not on file  Social History Narrative   Patient is single.    Patient has 2 children.    Patient is disabled.    Patient has a  college education.    Social Determinants of Health   Financial Resource Strain:   . Difficulty of Paying Living Expenses: Not on file  Food Insecurity:   . Worried About Charity fundraiser in the Last Year: Not on file  . Ran Out of Food in the Last Year: Not on file  Transportation Needs:   . Lack of Transportation (Medical): Not on file  . Lack of Transportation (Non-Medical): Not on file  Physical Activity:   . Days of Exercise per Week: Not on file  . Minutes of Exercise per Session: Not on file  Stress:   . Feeling of Stress : Not on file  Social Connections:   . Frequency of Communication with Friends and Family: Not on file  . Frequency of Social Gatherings with Friends and Family: Not on file  . Attends Religious Services: Not on file  . Active Member of Clubs or Organizations: Not on file  . Attends Archivist Meetings: Not on file  . Marital Status: Not on file  Intimate Partner Violence:   . Fear of Current or Ex-Partner: Not on file  . Emotionally Abused: Not on file  . Physically Abused: Not on file  . Sexually Abused: Not on file    Allergies  Allergen Reactions  . Morphine And Related Itching  . Vimovo [Naproxen-Esomeprazole] Swelling    Lip swelling     Review of Systems: Review of Systems  Constitutional: Negative for chills and fever.  Musculoskeletal: Negative for joint pain.  Skin: Negative for rash.     All other systems reviewed and negative.  Objective: Vitals:   09/26/20 0848  BP: 121/69  Pulse: 76  Temp: 97.8 F (36.6 C)  TempSrc: Oral  Weight: 140 lb (63.5 kg)   Body mass index is 22.6 kg/m.  Physical Exam Vitals reviewed.  Constitutional:      Appearance: Normal appearance.  HENT:     Head: Normocephalic and atraumatic.  Musculoskeletal:        General: No swelling or tenderness. Normal range of motion.     Comments: She has normal ROM without any pain in her right elbow.  Her incision is healing nicely  without any warmth or erythema.   Skin:    General: Skin is warm and dry.     Findings: No erythema.  Neurological:     Mental Status: She is alert.       Pertinent Labs and Microbiology CBC Latest Ref Rng & Units 09/01/2020 09/08/2018 03/13/2015  WBC 3.8 - 10.8 Thousand/uL 7.8 8.1 6.7  Hemoglobin 11.7 - 15.5 g/dL 13.3 12.2 12.0  Hematocrit 35 -  45 % 39.8 40.0 36.4  Platelets 140 - 400 Thousand/uL 269 221 141(L)   CMP Latest Ref Rng & Units 09/01/2020 09/08/2018 03/13/2015  Glucose 65 - 99 mg/dL 102(H) 105(H) 93  BUN 7 - 25 mg/dL 10 19 9   Creatinine 0.50 - 1.05 mg/dL 0.89 0.83 0.75  Sodium 135 - 146 mmol/L 138 138 137  Potassium 3.5 - 5.3 mmol/L 4.4 3.6 3.6  Chloride 98 - 110 mmol/L 103 105 98  CO2 20 - 32 mmol/L 27 23 30   Calcium 8.6 - 10.4 mg/dL 10.1 9.8 10.0  Total Protein 6.1 - 8.1 g/dL 7.0 7.1 7.0  Total Bilirubin 0.2 - 1.2 mg/dL 0.5 0.6 0.7  Alkaline Phos 38 - 126 U/L - 64 89  AST 10 - 35 U/L 21 20 30   ALT 6 - 29 U/L 14 17 20     C-Reactive Protein     Component Value Date/Time   CRP 4.3 09/01/2020 0940   Erythrocyte Sedimentation Rate     Component Value Date/Time   ESRSEDRATE 2 09/01/2020 0940     Assessment and Plan: Gout, arthritis She presents for follow-up today after initially being seen in our clinic October 11.  She had follow-up recently with orthopedic surgery on October 26.  At that exam she was noted to be much less swollen and had regained almost all of her motion.  There were no signs of infection and her incision was clean and dry.  She completed approximately 2 weeks of "mop up" therapy with TMP-SMX 1-2 weeks ago and has been doing well from an infectious standpoint without any fevers, chills, sweats.  She continues to recover from her surgery and clinically does not have any signs or symptoms of infection.  Plan: -- clinically no evidence of infection and will continue to monitor off antibiotics -- RTC as needed.    Raynelle Highland for Infectious Disease [facility] 09/26/2020, 9:31 AM

## 2020-09-25 NOTE — Assessment & Plan Note (Addendum)
She presents for follow-up today after initially being seen in our clinic October 11.  She had follow-up recently with orthopedic surgery on October 26.  At that exam she was noted to be much less swollen and had regained almost all of her motion.  There were no signs of infection and her incision was clean and dry.  She completed approximately 2 weeks of "mop up" therapy with TMP-SMX 1-2 weeks ago and has been doing well from an infectious standpoint without any fevers, chills, sweats.  She continues to recover from her surgery and clinically does not have any signs or symptoms of infection.  Plan: -- clinically no evidence of infection and will continue to monitor off antibiotics -- RTC as needed.

## 2020-09-26 ENCOUNTER — Ambulatory Visit: Payer: Medicare HMO | Admitting: Internal Medicine

## 2020-09-26 ENCOUNTER — Other Ambulatory Visit: Payer: Self-pay

## 2020-09-26 ENCOUNTER — Encounter: Payer: Self-pay | Admitting: Internal Medicine

## 2020-09-26 VITALS — BP 121/69 | HR 76 | Temp 97.8°F | Wt 140.0 lb

## 2020-09-26 DIAGNOSIS — M109 Gout, unspecified: Secondary | ICD-10-CM

## 2020-09-26 NOTE — Patient Instructions (Signed)
Thank you for coming to see me today. It was a pleasure seeing you.  Please follow up with Dr Burney Gauze as scheduled for your surgery but I am glad you are feeling better off antibiotics and there does not seem to be any signs of infection.  If you have any questions or concerns, please do not hesitate to call the office at [phone].  Take Care,   Jule Ser, DO

## 2020-10-07 ENCOUNTER — Ambulatory Visit: Payer: Medicare HMO | Admitting: Internal Medicine

## 2020-10-14 DIAGNOSIS — Z4789 Encounter for other orthopedic aftercare: Secondary | ICD-10-CM | POA: Diagnosis not present

## 2020-11-24 DIAGNOSIS — S83241A Other tear of medial meniscus, current injury, right knee, initial encounter: Secondary | ICD-10-CM | POA: Diagnosis not present

## 2020-11-24 DIAGNOSIS — S83281A Other tear of lateral meniscus, current injury, right knee, initial encounter: Secondary | ICD-10-CM | POA: Diagnosis not present

## 2021-01-19 DIAGNOSIS — M25561 Pain in right knee: Secondary | ICD-10-CM | POA: Diagnosis not present

## 2021-02-03 DIAGNOSIS — M1711 Unilateral primary osteoarthritis, right knee: Secondary | ICD-10-CM | POA: Diagnosis not present

## 2021-02-10 DIAGNOSIS — M1711 Unilateral primary osteoarthritis, right knee: Secondary | ICD-10-CM | POA: Diagnosis not present

## 2021-02-17 DIAGNOSIS — M1711 Unilateral primary osteoarthritis, right knee: Secondary | ICD-10-CM | POA: Diagnosis not present

## 2021-02-27 ENCOUNTER — Other Ambulatory Visit (HOSPITAL_BASED_OUTPATIENT_CLINIC_OR_DEPARTMENT_OTHER): Payer: Self-pay | Admitting: Family Medicine

## 2021-02-27 DIAGNOSIS — Z1231 Encounter for screening mammogram for malignant neoplasm of breast: Secondary | ICD-10-CM

## 2021-02-28 ENCOUNTER — Other Ambulatory Visit: Payer: Self-pay

## 2021-02-28 ENCOUNTER — Ambulatory Visit (HOSPITAL_BASED_OUTPATIENT_CLINIC_OR_DEPARTMENT_OTHER)
Admission: RE | Admit: 2021-02-28 | Discharge: 2021-02-28 | Disposition: A | Discharge status: Home / Self Care | Payer: Medicare HMO | Source: Ambulatory Visit | Attending: Family Medicine | Admitting: Family Medicine

## 2021-02-28 DIAGNOSIS — Z1231 Encounter for screening mammogram for malignant neoplasm of breast: Secondary | ICD-10-CM | POA: Diagnosis not present

## 2021-03-02 DIAGNOSIS — G894 Chronic pain syndrome: Secondary | ICD-10-CM | POA: Diagnosis not present

## 2021-03-02 DIAGNOSIS — S90121A Contusion of right lesser toe(s) without damage to nail, initial encounter: Secondary | ICD-10-CM | POA: Diagnosis not present

## 2021-03-02 DIAGNOSIS — M7989 Other specified soft tissue disorders: Secondary | ICD-10-CM | POA: Diagnosis not present

## 2021-03-02 DIAGNOSIS — Z886 Allergy status to analgesic agent status: Secondary | ICD-10-CM | POA: Diagnosis not present

## 2021-03-02 DIAGNOSIS — Z885 Allergy status to narcotic agent status: Secondary | ICD-10-CM | POA: Diagnosis not present

## 2021-03-02 DIAGNOSIS — Z79891 Long term (current) use of opiate analgesic: Secondary | ICD-10-CM | POA: Diagnosis not present

## 2021-03-02 DIAGNOSIS — Z79899 Other long term (current) drug therapy: Secondary | ICD-10-CM | POA: Diagnosis not present

## 2021-03-02 DIAGNOSIS — G8911 Acute pain due to trauma: Secondary | ICD-10-CM | POA: Diagnosis not present

## 2021-03-21 DIAGNOSIS — J209 Acute bronchitis, unspecified: Secondary | ICD-10-CM | POA: Diagnosis not present

## 2021-04-06 DIAGNOSIS — M545 Low back pain, unspecified: Secondary | ICD-10-CM | POA: Diagnosis not present

## 2021-04-06 DIAGNOSIS — U099 Post covid-19 condition, unspecified: Secondary | ICD-10-CM | POA: Diagnosis not present

## 2021-04-06 DIAGNOSIS — R053 Chronic cough: Secondary | ICD-10-CM | POA: Diagnosis not present

## 2021-04-28 DIAGNOSIS — M533 Sacrococcygeal disorders, not elsewhere classified: Secondary | ICD-10-CM | POA: Diagnosis not present

## 2021-04-28 DIAGNOSIS — M545 Low back pain, unspecified: Secondary | ICD-10-CM | POA: Diagnosis not present

## 2021-04-28 DIAGNOSIS — M79641 Pain in right hand: Secondary | ICD-10-CM | POA: Diagnosis not present

## 2021-04-28 DIAGNOSIS — M79671 Pain in right foot: Secondary | ICD-10-CM | POA: Diagnosis not present

## 2021-04-28 DIAGNOSIS — Z79899 Other long term (current) drug therapy: Secondary | ICD-10-CM | POA: Diagnosis not present

## 2021-04-28 DIAGNOSIS — M79642 Pain in left hand: Secondary | ICD-10-CM | POA: Diagnosis not present

## 2021-04-28 DIAGNOSIS — M79672 Pain in left foot: Secondary | ICD-10-CM | POA: Diagnosis not present

## 2021-04-28 DIAGNOSIS — Z5181 Encounter for therapeutic drug level monitoring: Secondary | ICD-10-CM | POA: Diagnosis not present

## 2021-05-11 DIAGNOSIS — M533 Sacrococcygeal disorders, not elsewhere classified: Secondary | ICD-10-CM | POA: Diagnosis not present

## 2021-05-19 DIAGNOSIS — M1711 Unilateral primary osteoarthritis, right knee: Secondary | ICD-10-CM | POA: Diagnosis not present

## 2021-05-29 DIAGNOSIS — M1711 Unilateral primary osteoarthritis, right knee: Secondary | ICD-10-CM | POA: Diagnosis not present

## 2021-05-29 DIAGNOSIS — G43909 Migraine, unspecified, not intractable, without status migrainosus: Secondary | ICD-10-CM | POA: Diagnosis not present

## 2021-05-29 DIAGNOSIS — Z96652 Presence of left artificial knee joint: Secondary | ICD-10-CM | POA: Diagnosis not present

## 2021-05-29 DIAGNOSIS — Z79899 Other long term (current) drug therapy: Secondary | ICD-10-CM | POA: Diagnosis not present

## 2021-05-29 DIAGNOSIS — M961 Postlaminectomy syndrome, not elsewhere classified: Secondary | ICD-10-CM | POA: Diagnosis not present

## 2021-05-29 DIAGNOSIS — Z1159 Encounter for screening for other viral diseases: Secondary | ICD-10-CM | POA: Diagnosis not present

## 2021-06-01 DIAGNOSIS — Z79899 Other long term (current) drug therapy: Secondary | ICD-10-CM | POA: Diagnosis not present

## 2021-06-08 DIAGNOSIS — M961 Postlaminectomy syndrome, not elsewhere classified: Secondary | ICD-10-CM | POA: Diagnosis not present

## 2021-06-08 DIAGNOSIS — Z96652 Presence of left artificial knee joint: Secondary | ICD-10-CM | POA: Diagnosis not present

## 2021-06-08 DIAGNOSIS — M1711 Unilateral primary osteoarthritis, right knee: Secondary | ICD-10-CM | POA: Diagnosis not present

## 2021-06-08 DIAGNOSIS — Z79899 Other long term (current) drug therapy: Secondary | ICD-10-CM | POA: Diagnosis not present

## 2021-06-08 DIAGNOSIS — G43909 Migraine, unspecified, not intractable, without status migrainosus: Secondary | ICD-10-CM | POA: Diagnosis not present

## 2021-07-06 DIAGNOSIS — M1711 Unilateral primary osteoarthritis, right knee: Secondary | ICD-10-CM | POA: Diagnosis not present

## 2021-07-07 DIAGNOSIS — R0602 Shortness of breath: Secondary | ICD-10-CM | POA: Diagnosis not present

## 2021-07-07 DIAGNOSIS — Z96652 Presence of left artificial knee joint: Secondary | ICD-10-CM | POA: Diagnosis not present

## 2021-07-07 DIAGNOSIS — F17210 Nicotine dependence, cigarettes, uncomplicated: Secondary | ICD-10-CM | POA: Diagnosis not present

## 2021-07-07 DIAGNOSIS — M1711 Unilateral primary osteoarthritis, right knee: Secondary | ICD-10-CM | POA: Diagnosis not present

## 2021-07-07 DIAGNOSIS — M961 Postlaminectomy syndrome, not elsewhere classified: Secondary | ICD-10-CM | POA: Diagnosis not present

## 2021-07-07 DIAGNOSIS — G43909 Migraine, unspecified, not intractable, without status migrainosus: Secondary | ICD-10-CM | POA: Diagnosis not present

## 2021-07-07 DIAGNOSIS — Z79899 Other long term (current) drug therapy: Secondary | ICD-10-CM | POA: Diagnosis not present

## 2021-07-15 DIAGNOSIS — Z01818 Encounter for other preprocedural examination: Secondary | ICD-10-CM | POA: Diagnosis not present

## 2021-07-15 DIAGNOSIS — Z419 Encounter for procedure for purposes other than remedying health state, unspecified: Secondary | ICD-10-CM | POA: Diagnosis not present

## 2021-08-07 DIAGNOSIS — M961 Postlaminectomy syndrome, not elsewhere classified: Secondary | ICD-10-CM | POA: Diagnosis not present

## 2021-08-07 DIAGNOSIS — M1711 Unilateral primary osteoarthritis, right knee: Secondary | ICD-10-CM | POA: Diagnosis not present

## 2021-08-07 DIAGNOSIS — Z79899 Other long term (current) drug therapy: Secondary | ICD-10-CM | POA: Diagnosis not present

## 2021-08-07 DIAGNOSIS — Z96652 Presence of left artificial knee joint: Secondary | ICD-10-CM | POA: Diagnosis not present

## 2021-08-07 DIAGNOSIS — F17210 Nicotine dependence, cigarettes, uncomplicated: Secondary | ICD-10-CM | POA: Diagnosis not present

## 2021-08-07 DIAGNOSIS — G43909 Migraine, unspecified, not intractable, without status migrainosus: Secondary | ICD-10-CM | POA: Diagnosis not present

## 2021-08-10 DIAGNOSIS — Z79899 Other long term (current) drug therapy: Secondary | ICD-10-CM | POA: Diagnosis not present

## 2021-08-25 DIAGNOSIS — M1711 Unilateral primary osteoarthritis, right knee: Secondary | ICD-10-CM | POA: Diagnosis not present

## 2021-08-31 DIAGNOSIS — M1711 Unilateral primary osteoarthritis, right knee: Secondary | ICD-10-CM | POA: Diagnosis not present

## 2021-08-31 NOTE — Progress Notes (Signed)
Sent message, via epic in basket, requesting orders in epic from surgeon.  

## 2021-09-03 ENCOUNTER — Other Ambulatory Visit: Payer: Self-pay | Admitting: Surgical

## 2021-09-03 DIAGNOSIS — Z96652 Presence of left artificial knee joint: Secondary | ICD-10-CM | POA: Diagnosis not present

## 2021-09-03 DIAGNOSIS — Z1159 Encounter for screening for other viral diseases: Secondary | ICD-10-CM | POA: Diagnosis not present

## 2021-09-03 DIAGNOSIS — F17210 Nicotine dependence, cigarettes, uncomplicated: Secondary | ICD-10-CM | POA: Diagnosis not present

## 2021-09-03 DIAGNOSIS — Z6824 Body mass index (BMI) 24.0-24.9, adult: Secondary | ICD-10-CM | POA: Diagnosis not present

## 2021-09-03 DIAGNOSIS — G43909 Migraine, unspecified, not intractable, without status migrainosus: Secondary | ICD-10-CM | POA: Diagnosis not present

## 2021-09-03 DIAGNOSIS — Z79899 Other long term (current) drug therapy: Secondary | ICD-10-CM | POA: Diagnosis not present

## 2021-09-03 DIAGNOSIS — F17200 Nicotine dependence, unspecified, uncomplicated: Secondary | ICD-10-CM

## 2021-09-03 DIAGNOSIS — M961 Postlaminectomy syndrome, not elsewhere classified: Secondary | ICD-10-CM | POA: Diagnosis not present

## 2021-09-03 DIAGNOSIS — M1711 Unilateral primary osteoarthritis, right knee: Secondary | ICD-10-CM | POA: Diagnosis not present

## 2021-09-07 NOTE — Patient Instructions (Signed)
DUE TO COVID-19 ONLY ONE VISITOR IS ALLOWED TO COME WITH YOU AND STAY IN THE WAITING ROOM ONLY DURING PRE OP AND PROCEDURE DAY OF SURGERY IF YOU ARE GOING HOME AFTER SURGERY. IF YOU ARE SPENDING THE NIGHT 2 PEOPLE MAY VISIT WITH YOU IN YOUR PRIVATE ROOM AFTER SURGERY UNTIL VISITING  HOURS ARE OVER AT 8:00 PM AND 1 VISITORS CAN SPEND THE NIGHT.   YOU NEED TO HAVE A COVID 19 TEST ON_10/27_____THIS TEST MUST BE DONE BEFORE SURGERY,  COVID TESTING SITE  IS LOCATED AT [place], [place]. REMAIN IN YOUR CAR THIS IS A DRIVE UP TEST. AFTER YOUR COVID TEST PLEASE WEAR A MASK OUT IN PUBLIC AND SOCIAL DISTANCE AND [place] YOUR HANDS FREQUENTLY, ALSO ASK ALL YOUR CLOSE CONTACT PERSONS TO WEAR A MASK AND SOCIAL DISTANCE AND [place] THEIR HANDS FREQUENTLY ALSO.               [name]     Your procedure is scheduled on: 09/21/21   Report to [facility] Main  Entrance   Report to admitting at   9:55 AM     Call this number if you have problems the morning of surgery [phone]   No food after midnight.    You may have clear liquid until 9:30 AM.    At 9:00 AM drink pre surgery drink.   Nothing by mouth after 9:30 AM.    CLEAR LIQUID DIET   Foods Allowed                                                                     Foods Excluded                                                                                         liquids that you cannot  Plain Jell-O any favor except red or purple                                           see through such as: Fruit ices (not with fruit pulp)                                     milk, soups, orange juice  Iced Popsicles                                    All solid food Carbonated beverages, regular and diet  Cranberry, grape and apple juices Sports drinks like Gatorade Lightly seasoned clear broth or consume(fat free) Sugar    BRUSH YOUR TEETH MORNING OF SURGERY AND RINSE YOUR MOUTH OUT, NO  CHEWING GUM CANDY OR MINTS.     Take these medicines the morning of surgery with A SIP OF WATER: Pantoprazole                                You may not have any metal on your body including hair pins and              piercings  Do not wear jewelry, make-up, lotions, powders or perfumes, deodorant             Do not wear nail polish on your fingernails.  Do not shave  48 hours prior to surgery.                Do not bring valuables to the hospital. [place].  Contacts, dentures or bridgework may not be worn into surgery.  Leave suitcase in the car. After surgery it may be brought to your room.      Special Instructions: N/A              Please read over the following fact sheets you were given: _____________________________________________________________________             [facility] - Preparing for Surgery Before surgery, you can play an important role.  Because skin is not sterile, your skin needs to be as free of germs as possible.  You can reduce the number of germs on your skin by washing with CHG (chlorahexidine gluconate) soap before surgery.  CHG is an antiseptic cleaner which kills germs and bonds with the skin to continue killing germs even after washing. Please DO NOT use if you have an allergy to CHG or antibacterial soaps.  If your skin becomes reddened/irritated stop using the CHG and inform your nurse when you arrive at Short Stay. Do not shave (including legs and underarms) for at least 48 hours prior to the first CHG shower.  Please follow these instructions carefully:  1.  Shower with CHG Soap the night before surgery and the  morning of Surgery.  2.  If you choose to wash your hair, wash your hair first as usual with your  normal  shampoo.  3.  After you shampoo, rinse your hair and body thoroughly to remove the  shampoo.                            4.  Use CHG as you would any other liquid soap.  You can apply chg  directly  to the skin and wash                       Gently with a scrungie or clean washcloth.  5.  Apply the CHG Soap to your body ONLY FROM THE NECK DOWN.   Do not use on face/ open                           Wound or open sores. Avoid contact with eyes, ears mouth and genitals (private parts).  Wash face,  Genitals (private parts) with your normal soap.             6.  Wash thoroughly, paying special attention to the area where your surgery  will be performed.  7.  Thoroughly rinse your body with warm water from the neck down.  8.  DO NOT shower/wash with your normal soap after using and rinsing off  the CHG Soap.                9.  Pat yourself dry with a clean towel.            10.  Wear clean pajamas.            11.  Place clean sheets on your bed the night of your first shower and do not  sleep with pets. Day of Surgery : Do not apply any lotions/deodorants the morning of surgery.  Please wear clean clothes to the hospital/surgery center.  FAILURE TO FOLLOW THESE INSTRUCTIONS MAY RESULT IN THE CANCELLATION OF YOUR SURGERY PATIENT SIGNATURE_________________________________  NURSE SIGNATURE__________________________________  ________________________________________________________________________   [name]  An incentive spirometer is a tool that can help keep your lungs clear and active. This tool measures how well you are filling your lungs with each breath. Taking long deep breaths may help reverse or decrease the chance of developing breathing (pulmonary) problems (especially infection) following: A long period of time when you are unable to move or be active. BEFORE THE PROCEDURE  If the spirometer includes an indicator to show your best effort, your nurse or respiratory therapist will set it to a desired goal. If possible, sit up straight or lean slightly forward. Try not to slouch. Hold the incentive spirometer in an upright  position. INSTRUCTIONS FOR USE  Sit on the edge of your bed if possible, or sit up as far as you can in bed or on a chair. Hold the incentive spirometer in an upright position. Breathe out normally. Place the mouthpiece in your mouth and seal your lips tightly around it. Breathe in slowly and as deeply as possible, raising the piston or the ball toward the top of the column. Hold your breath for 3-5 seconds or for as long as possible. Allow the piston or ball to fall to the bottom of the column. Remove the mouthpiece from your mouth and breathe out normally. Rest for a few seconds and repeat Steps 1 through 7 at least 10 times every 1-2 hours when you are awake. Take your time and take a few normal breaths between deep breaths. The spirometer may include an indicator to show your best effort. Use the indicator as a goal to work toward during each repetition. After each set of 10 deep breaths, practice coughing to be sure your lungs are clear. If you have an incision (the cut made at the time of surgery), support your incision when coughing by placing a pillow or rolled up towels firmly against it. Once you are able to get out of bed, walk around indoors and cough well. You may stop using the incentive spirometer when instructed by your caregiver.  RISKS AND COMPLICATIONS Take your time so you do not get dizzy or light-headed. If you are in pain, you may need to take or ask for pain medication before doing incentive spirometry. It is harder to take a deep breath if you are having pain. AFTER USE Rest and breathe slowly and easily. It can be helpful to keep track of  a log of your progress. Your caregiver can provide you with a simple table to help with this. If you are using the spirometer at home, follow these instructions: [place] IF:  You are having difficultly using the spirometer. You have trouble using the spirometer as often as instructed. Your pain medication is not giving  enough relief while using the spirometer. You develop fever of 100.5 F (38.1 C) or higher. SEEK IMMEDIATE MEDICAL CARE IF:  You cough up bloody sputum that had not been present before. You develop fever of 102 F (38.9 C) or greater. You develop worsening pain at or near the incision site. MAKE SURE YOU:  Understand these instructions. Will watch your condition. Will get help right away if you are not doing well or get worse. Document Released: 03/21/2007 Document Revised: 01/31/2012 Document Reviewed: 05/22/2007 [facility] Patient Information 2014 [place], [place].   ________________________________________________________________________

## 2021-09-08 ENCOUNTER — Other Ambulatory Visit: Payer: Self-pay

## 2021-09-08 ENCOUNTER — Encounter (HOSPITAL_COMMUNITY)
Admission: RE | Admit: 2021-09-08 | Discharge: 2021-09-08 | Disposition: A | Discharge status: Home / Self Care | Payer: Medicare HMO | Source: Ambulatory Visit | Attending: Orthopedic Surgery | Admitting: Orthopedic Surgery

## 2021-09-08 ENCOUNTER — Encounter (HOSPITAL_COMMUNITY): Payer: Self-pay

## 2021-09-08 DIAGNOSIS — Z01812 Encounter for preprocedural laboratory examination: Secondary | ICD-10-CM | POA: Insufficient documentation

## 2021-09-08 LAB — CBC
HCT: 37.3 % (ref 36.0–46.0)
Hemoglobin: 12.3 g/dL (ref 12.0–15.0)
MCH: 31.5 pg (ref 26.0–34.0)
MCHC: 33 g/dL (ref 30.0–36.0)
MCV: 95.6 fL (ref 80.0–100.0)
Platelets: 144 10*3/uL — ABNORMAL LOW (ref 150–400)
RBC: 3.9 MIL/uL (ref 3.87–5.11)
RDW: 12.5 % (ref 11.5–15.5)
WBC: 5.1 10*3/uL (ref 4.0–10.5)
nRBC: 0 % (ref 0.0–0.2)

## 2021-09-08 LAB — COMPREHENSIVE METABOLIC PANEL
ALT: 17 U/L (ref 0–44)
AST: 22 U/L (ref 15–41)
Albumin: 4.5 g/dL (ref 3.5–5.0)
Alkaline Phosphatase: 80 U/L (ref 38–126)
Anion gap: 10 (ref 5–15)
BUN: 9 mg/dL (ref 6–20)
CO2: 26 mmol/L (ref 22–32)
Calcium: 9.6 mg/dL (ref 8.9–10.3)
Chloride: 99 mmol/L (ref 98–111)
Creatinine, Ser: 0.57 mg/dL (ref 0.44–1.00)
GFR, Estimated: >60 mL/min (ref >60)
Glucose, Bld: 109 mg/dL — ABNORMAL HIGH (ref 70–99)
Potassium: 4 mmol/L (ref 3.5–5.1)
Sodium: 135 mmol/L (ref 135–145)
Total Bilirubin: 0.5 mg/dL (ref 0.3–1.2)
Total Protein: 7.5 g/dL (ref 6.5–8.1)

## 2021-09-08 LAB — SURGICAL PCR SCREEN
MRSA, PCR: NEGATIVE
Staphylococcus aureus: NEGATIVE

## 2021-09-08 NOTE — Progress Notes (Signed)
COVID  test-09/17/21  PCP - Dr. Anne Shutter Cardiologist - none  Chest x-ray - no EKG - no Stress Test - no ECHO - no Cardiac Cath - no Pacemaker/ICD device last checked:NA  Sleep Study - no CPAP -   Fasting Blood Sugar - NA Checks Blood Sugar _____ times a day  Blood Thinner Instructions:NA Aspirin Instructions: Last Dose:  Anesthesia review: no  Patient denies shortness of breath, fever, cough and chest pain at PAT appointment No SOB with any activities  Patient verbalized understanding of instructions that were given to them at the PAT appointment. Patient was also instructed that they will need to review over the PAT instructions again at home before surgery. yes

## 2021-09-11 NOTE — Care Plan (Signed)
Ortho Bundle Case Management Note  Patient Details  Name: [name] MRN: [id number] Date of Birth: Jul 12, 1961  Met with patient in the office prior to surgery. SHe will discharge to home with family to assist. Has rolling walker at home. OPPT set up with [facility]. MD spoke with her extensively about smoking cessation. Patient and MD in agreement with plan. Choice offered                  DME Arranged:    DME Agency:     HH Arranged:    HH Agency:     Additional Comments: Please contact me with any questions of if this plan should need to change.  Ladell Heads,  [place] [facility]ist  [phone] 09/11/2021, 3:40 PM

## 2021-09-15 NOTE — H&P (Addendum)
KNEE ARTHROPLASTY ADMISSION H&P  Patient ID: [name] MRN: [id number] DOB/AGE: June 09, 1961 60 y.o.  Chief Complaint: right knee pain.  Planned Procedure Date: 09/21/21  Medical Clearance by Carolee Rota, FNP Additional clearance by Ronette Deter, PA-C (Pain Management)  HPI: [name] is a 60 y.o. female who presents for evaluation of OA RIGHT KNEE. The patient has a history of pain and functional disability in the right knee due to arthritis and has failed non-surgical conservative treatments for greater than 12 weeks to include NSAID's and/or analgesics, corticosteriod injections, viscosupplementation injections, flexibility and strengthening excercises, and activity modification.  Onset of symptoms was gradual, starting >10 years ago with gradually worsening course since that time. The patient noted prior procedures on the knee to include  arthroscopy and ACL reconstruction on the right knee.  Patient currently rates pain at 5 out of 10 with activity. Patient has worsening of pain with activity and weight bearing and pain that interferes with activities of daily living.  Patient has evidence of joint space narrowing by imaging studies.  There is no active infection.  Past Medical History:  Diagnosis Date   Alcoholic pancreatitis 53/6644   01/15/13   Anemia    Reports being borderline   Anxiety    Chronic lower back pain    Chronic pain    treated at [facility]    Complex tear of lateral meniscus of right knee 09/18/2018   Depression    Fibromyalgia    History of blood transfusion    "related to ORs"   Migraine    "monthly" (03/25/2015)   Osteoarthritis    "wrists, knees, back, neck, fingers,  q joint" (03/25/2015)   Primary localized osteoarthritis of right knee    Past Surgical History:  Procedure Laterality Date   ANTERIOR CERVICAL DECOMP/DISCECTOMY FUSION     2006   BACK SURGERY  2014   CARPAL TUNNEL RELEASE Right 2010   CYSTECTOMY Right 1980's   hand    CYSTECTOMY     "off my mid to lower back"   ENDOMETRIAL ABLATION  ~ 2005   KNEE ARTHROSCOPY WITH MEDIAL MENISECTOMY Right 09/19/2018   Procedure: KNEE ARTHROSCOPY WITH LATERAL MENISECTOMY AND MEDIAL MENISECTOMY, CHONDROPLASTY;  Surgeon: Elsie Saas, MD;  Location: [facility];  Service: Orthopedics;  Laterality: Right;   KNEE SURGERY Left 2007   total knee   KNEE SURGERY     "I've had ~ 4 ORs on this knee in the last 20 yrs" (03/25/2015)   LAPAROSCOPIC CHOLECYSTECTOMY  2011   OSTEOTOMY AND ULNAR SHORTENING Left ~ 2010 X 2   POSTERIOR FUSION CERVICAL SPINE  2008   TONSILLECTOMY  1960's   TOTAL HIP ARTHROPLASTY Left 01/07/2015   Procedure: TOTAL HIP ARTHROPLASTY ANTERIOR APPROACH;  Surgeon: Renette Butters, MD;  Location: [facility];  Service: Orthopedics;  Laterality: Left;   TOTAL HIP ARTHROPLASTY Right 03/25/2015   Procedure: RIGHT TOTAL HIP ARTHROPLASTY ANTERIOR APPROACH;  Surgeon: Renette Butters, MD;  Location: [facility];  Service: Orthopedics;  Laterality: Right;   Allergies  Allergen Reactions   Morphine And Related Itching   Vimovo [Naproxen-Esomeprazole Mg] Swelling    Lip swelling   Prior to Admission medications   Medication Sig Start Date End Date Taking? Authorizing Provider  baclofen (LIORESAL) 10 MG tablet Take 10 mg by mouth daily as needed (headaches).   Yes [provider]  HYDROcodone-acetaminophen (NORCO) 10-325 MG tablet Take 1 tablet by mouth 4 (four) times daily  as needed for moderate pain.   Yes [provider]  naloxone (NARCAN) nasal spray 4 mg/0.1 mL Place 1 spray into the nose as needed (opioid overdose). 09/13/18  Yes [provider]  pantoprazole (PROTONIX) 40 MG tablet Take 40 mg by mouth at bedtime.   Yes [provider]  rOPINIRole (REQUIP) 0.25 MG tablet Take 0.5 mg by mouth at bedtime. 08/04/20  Yes [provider]  SUMAtriptan (IMITREX) 100 MG tablet Take 100 mg by mouth every 2 (two) hours as  needed for migraine.  05/27/14  Yes [provider]  traZODone (DESYREL) 150 MG tablet Take 150 mg by mouth at bedtime.   Yes [provider]   Social History   Socioeconomic History   Marital status: Married    Spouse name: [name]    Number of children: 2   Years of education: 12+   Highest education level: Not on file  Occupational History   Occupation: Disabled  Tobacco Use   Smoking status: Former    Packs/day: 0.50    Years: 35.00    Pack years: 17.50    Types: Cigarettes    Quit date: 08/31/2021    Years since quitting: 0.0   Smokeless tobacco: Never   Tobacco comments:    no ready quit   Vaping Use   Vaping Use: Never used  Substance and Sexual Activity   Alcohol use: Yes    Alcohol/week: 0.0 standard drinks    Comment: rare   Drug use: No    Comment: occassional marijuana   Sexual activity: Yes    Partners: Male  Other Topics Concern   Not on file  Social History Narrative   Patient is single.    Patient has 2 children.    Patient is disabled.    Patient has a college education.    Social Determinants of Health   Financial Resource Strain: Not on file  Food Insecurity: Not on file  Transportation Needs: Not on file  Physical Activity: Not on file  Stress: Not on file  Social Connections: Not on file   Family History  Problem Relation Age of Onset   Cancer Mother    Cancer Sister     ROS: Currently denies lightheadedness, dizziness, Fever, chills, CP, SOB.   No personal history of DVT, PE, MI, or CVA. No loose teeth or dentures All other systems have been reviewed and were otherwise currently negative with the exception of those mentioned in the HPI and as above.  Objective: Vitals: Ht: 5\' 5"  Wt: 151.4 lbs Temp: 98.3 BP: 159/77 Pulse: 94 O2 96% on room air.   Physical Exam: General: Alert, NAD.  Antalgic Gait  HEENT: EOMI, Good Neck Extension  Pulm: No increased work of breathing.  Clear B/L A/P w/o crackle or wheeze.   CV: RRR, No m/g/r appreciated  GI: soft, NT, ND Neuro: Neuro without gross focal deficit.  Sensation intact distally Skin: No lesions in the area of chief complaint MSK/Surgical Site: right knee w/o redness or effusion.  + medial JLT. ROM 0-120 deg.  5/5 strength in extension and flexion.  +EHL/FHL. RLE warm and well perfused with 2+ pulse. Distal sensation intact.  Stable varus and valgus stress.   Imaging Review Plain radiographs demonstrate moderate degenerative joint disease of the right knee.   Preoperative templating of the joint replacement has been completed, documented, and submitted to the Operating Room personnel in order to optimize intra-operative equipment management.  Assessment: OA RIGHT KNEE  Plan: Plan for Procedure(s): TOTAL KNEE ARTHROPLASTY  The patient history, physical exam, clinical judgement of the provider and imaging are consistent with end stage degenerative joint disease and total joint arthroplasty is deemed medically necessary. The treatment options including medical management, injection therapy, and arthroplasty were discussed at length. The risks and benefits of Procedure(s): TOTAL KNEE ARTHROPLASTY were presented and reviewed.  The risks of nonoperative treatment, versus surgical intervention including but not limited to continued pain, aseptic loosening, stiffness, dislocation/subluxation, infection, bleeding, nerve injury, blood clots, cardiopulmonary complications, morbidity, mortality, among others were discussed. The patient verbalizes understanding and wishes to proceed with the plan.   The patient does meet the criteria for TXA which will be used perioperatively.   ASA 81 mg BID will be used postoperatively for DVT prophylaxis in addition to SCDs, and early ambulation. The patient is planning to be discharged home with OPPT in care of her husband  Ventura Bruns, Hershal Coria 09/15/2021 2:29 PM

## 2021-09-17 ENCOUNTER — Other Ambulatory Visit: Payer: Self-pay | Admitting: Orthopedic Surgery

## 2021-09-17 LAB — SARS CORONAVIRUS 2 (TAT 6-24 HRS): SARS Coronavirus 2: NEGATIVE

## 2021-09-20 NOTE — Anesthesia Preprocedure Evaluation (Addendum)
Anesthesia Evaluation  Patient identified by MRN, date of birth, ID band Patient awake    Reviewed: Allergy & Precautions, H&P , NPO status , Patient's Chart, lab work & pertinent test results  History of Anesthesia Complications Negative for: history of anesthetic complications  Airway Mallampati: II  TM Distance: >3 FB Neck ROM: Full    Dental no notable dental hx. (+) Dental Advisory Given   Pulmonary former smoker,    Pulmonary exam normal        Cardiovascular negative cardio ROS Normal cardiovascular exam     Neuro/Psych PSYCHIATRIC DISORDERS Anxiety Depression negative neurological ROS     GI/Hepatic negative GI ROS, (+)     substance abuse  alcohol use,   Endo/Other  negative endocrine ROS  Renal/GU negative Renal ROS  negative genitourinary   Musculoskeletal  (+) Arthritis , Osteoarthritis,  Fibromyalgia -, narcotic dependent  Abdominal   Peds  Hematology  (+) Blood dyscrasia, anemia ,   Anesthesia Other Findings   Reproductive/Obstetrics negative OB ROS                            Anesthesia Physical  Anesthesia Plan  ASA: 3  Anesthesia Plan: Spinal   Post-op Pain Management:  Regional for Post-op pain   Induction: Intravenous  PONV Risk Score and Plan: 3 and Ondansetron, Treatment may vary due to age or medical condition, Dexamethasone and Midazolam  Airway Management Planned: Natural Airway  Additional Equipment:   Intra-op Plan:   Post-operative Plan: Extubation in OR  Informed Consent: I have reviewed the patients History and Physical, chart, labs and discussed the procedure including the risks, benefits and alternatives for the proposed anesthesia with the patient or authorized representative who has indicated his/her understanding and acceptance.     Dental advisory given  Plan Discussed with: Anesthesiologist and CRNA  Anesthesia Plan Comments: ( )        Anesthesia Quick Evaluation

## 2021-09-21 ENCOUNTER — Encounter (HOSPITAL_COMMUNITY): Payer: Self-pay | Admitting: Orthopedic Surgery

## 2021-09-21 ENCOUNTER — Ambulatory Visit (HOSPITAL_COMMUNITY): Payer: Medicare HMO

## 2021-09-21 ENCOUNTER — Encounter (HOSPITAL_COMMUNITY)
Admission: RE | Disposition: A | Discharge status: Home / Self Care | Payer: Self-pay | Source: Ambulatory Visit | Attending: Orthopedic Surgery

## 2021-09-21 ENCOUNTER — Ambulatory Visit (HOSPITAL_COMMUNITY): Payer: Medicare HMO | Admitting: Anesthesiology

## 2021-09-21 ENCOUNTER — Ambulatory Visit (HOSPITAL_COMMUNITY): Payer: Medicare HMO | Admitting: Physician Assistant

## 2021-09-21 ENCOUNTER — Ambulatory Visit (HOSPITAL_COMMUNITY)
Admission: RE | Admit: 2021-09-21 | Discharge: 2021-09-21 | Disposition: A | Discharge status: Home / Self Care | Payer: Medicare HMO | Source: Ambulatory Visit | Attending: Orthopedic Surgery | Admitting: Orthopedic Surgery

## 2021-09-21 DIAGNOSIS — Z885 Allergy status to narcotic agent status: Secondary | ICD-10-CM | POA: Diagnosis not present

## 2021-09-21 DIAGNOSIS — M797 Fibromyalgia: Secondary | ICD-10-CM | POA: Insufficient documentation

## 2021-09-21 DIAGNOSIS — Z471 Aftercare following joint replacement surgery: Secondary | ICD-10-CM | POA: Diagnosis not present

## 2021-09-21 DIAGNOSIS — Z886 Allergy status to analgesic agent status: Secondary | ICD-10-CM | POA: Insufficient documentation

## 2021-09-21 DIAGNOSIS — Z9889 Other specified postprocedural states: Secondary | ICD-10-CM

## 2021-09-21 DIAGNOSIS — Z809 Family history of malignant neoplasm, unspecified: Secondary | ICD-10-CM | POA: Insufficient documentation

## 2021-09-21 DIAGNOSIS — Z96651 Presence of right artificial knee joint: Secondary | ICD-10-CM | POA: Diagnosis not present

## 2021-09-21 DIAGNOSIS — Z87891 Personal history of nicotine dependence: Secondary | ICD-10-CM | POA: Insufficient documentation

## 2021-09-21 DIAGNOSIS — F418 Other specified anxiety disorders: Secondary | ICD-10-CM | POA: Diagnosis not present

## 2021-09-21 DIAGNOSIS — Z79899 Other long term (current) drug therapy: Secondary | ICD-10-CM | POA: Insufficient documentation

## 2021-09-21 DIAGNOSIS — M1711 Unilateral primary osteoarthritis, right knee: Secondary | ICD-10-CM | POA: Diagnosis present

## 2021-09-21 DIAGNOSIS — D649 Anemia, unspecified: Secondary | ICD-10-CM | POA: Diagnosis not present

## 2021-09-21 DIAGNOSIS — G8918 Other acute postprocedural pain: Secondary | ICD-10-CM | POA: Diagnosis not present

## 2021-09-21 HISTORY — PX: TOTAL KNEE ARTHROPLASTY: SHX125

## 2021-09-21 SURGERY — ARTHROPLASTY, KNEE, TOTAL
Anesthesia: Spinal | Site: Knee | Laterality: Right

## 2021-09-21 MED ORDER — LACTATED RINGERS IV SOLN: INTRAVENOUS | Status: DC

## 2021-09-21 MED ORDER — METOCLOPRAMIDE HCL 5 MG PO TABS
5.0000 mg | ORAL_TABLET | Freq: Three times a day (TID) | ORAL | Status: DC | PRN
  Filled 2021-09-21: qty 2

## 2021-09-21 MED ORDER — METOCLOPRAMIDE HCL 5 MG/ML IJ SOLN: 5.0000 mg | Freq: Three times a day (TID) | INTRAMUSCULAR | Status: DC | PRN

## 2021-09-21 MED ORDER — CHLORHEXIDINE GLUCONATE 0.12 % MT SOLN
15.0000 mL | Freq: Once | OROMUCOSAL | Status: AC
  Administered 2021-09-21: 15 mL via OROMUCOSAL

## 2021-09-21 MED ORDER — POVIDONE-IODINE 10 % EX SWAB: 2.0000 "application " | Freq: Once | CUTANEOUS | Status: DC

## 2021-09-21 MED ORDER — FENTANYL CITRATE (PF) 100 MCG/2ML IJ SOLN
INTRAMUSCULAR | Status: DC | PRN
  Administered 2021-09-21 (×2): 50 ug via INTRAVENOUS

## 2021-09-21 MED ORDER — PROMETHAZINE HCL 25 MG/ML IJ SOLN: 6.2500 mg | INTRAMUSCULAR | Status: DC | PRN

## 2021-09-21 MED ORDER — TRANEXAMIC ACID-NACL 1000-0.7 MG/100ML-% IV SOLN
1000.0000 mg | INTRAVENOUS | Status: AC
  Administered 2021-09-21: 1000 mg via INTRAVENOUS
  Filled 2021-09-21: qty 100

## 2021-09-21 MED ORDER — MIDAZOLAM HCL 2 MG/2ML IJ SOLN
INTRAMUSCULAR | Status: AC
  Filled 2021-09-21: qty 2

## 2021-09-21 MED ORDER — MIDAZOLAM HCL 2 MG/2ML IJ SOLN
INTRAMUSCULAR | Status: DC | PRN
Start: 2021-09-21 — End: 2021-09-21
  Administered 2021-09-21: 2 mg via INTRAVENOUS

## 2021-09-21 MED ORDER — ACETAMINOPHEN 500 MG PO TABS: 1000.0000 mg | ORAL_TABLET | Freq: Once | ORAL | Status: DC

## 2021-09-21 MED ORDER — ACETAMINOPHEN 325 MG PO TABS: 325.0000 mg | ORAL_TABLET | Freq: Four times a day (QID) | ORAL | Status: DC | PRN

## 2021-09-21 MED ORDER — ACETAMINOPHEN 500 MG PO TABS
1000.0000 mg | ORAL_TABLET | Freq: Once | ORAL | Status: AC
  Administered 2021-09-21: 1000 mg via ORAL
  Filled 2021-09-21: qty 2

## 2021-09-21 MED ORDER — ONDANSETRON HCL 4 MG/2ML IJ SOLN
INTRAMUSCULAR | Status: DC | PRN
  Administered 2021-09-21: 4 mg via INTRAVENOUS

## 2021-09-21 MED ORDER — POVIDONE-IODINE 7.5 % EX SOLN: Freq: Once | CUTANEOUS | Status: DC

## 2021-09-21 MED ORDER — DEXAMETHASONE SODIUM PHOSPHATE 10 MG/ML IJ SOLN
INTRAMUSCULAR | Status: DC | PRN
  Administered 2021-09-21: 5 mg

## 2021-09-21 MED ORDER — FENTANYL CITRATE PF 50 MCG/ML IJ SOSY: 25.0000 ug | PREFILLED_SYRINGE | INTRAMUSCULAR | Status: DC | PRN

## 2021-09-21 MED ORDER — DEXAMETHASONE SODIUM PHOSPHATE 10 MG/ML IJ SOLN
INTRAMUSCULAR | Status: AC
  Filled 2021-09-21: qty 1

## 2021-09-21 MED ORDER — FENTANYL CITRATE PF 50 MCG/ML IJ SOSY
50.0000 ug | PREFILLED_SYRINGE | INTRAMUSCULAR | Status: DC
  Administered 2021-09-21: 100 ug via INTRAVENOUS
  Filled 2021-09-21: qty 2

## 2021-09-21 MED ORDER — DEXMEDETOMIDINE (PRECEDEX) IN NS 20 MCG/5ML (4 MCG/ML) IV SYRINGE
PREFILLED_SYRINGE | INTRAVENOUS | Status: DC | PRN
  Administered 2021-09-21: 12 ug via INTRAVENOUS

## 2021-09-21 MED ORDER — ONDANSETRON HCL 4 MG PO TABS
4.0000 mg | ORAL_TABLET | Freq: Four times a day (QID) | ORAL | Status: DC | PRN
  Filled 2021-09-21: qty 1

## 2021-09-21 MED ORDER — ORAL CARE MOUTH RINSE: 15.0000 mL | Freq: Once | OROMUCOSAL | Status: AC

## 2021-09-21 MED ORDER — BUPIVACAINE LIPOSOME 1.3 % IJ SUSP
INTRAMUSCULAR | Status: DC | PRN
  Administered 2021-09-21: 20 mL

## 2021-09-21 MED ORDER — EPHEDRINE 5 MG/ML INJ
INTRAVENOUS | Status: AC
  Filled 2021-09-21: qty 5

## 2021-09-21 MED ORDER — WATER FOR IRRIGATION, STERILE IR SOLN
Status: DC | PRN
  Administered 2021-09-21: 2000 mL

## 2021-09-21 MED ORDER — MIDAZOLAM HCL 2 MG/2ML IJ SOLN
1.0000 mg | INTRAMUSCULAR | Status: DC
  Administered 2021-09-21: 2 mg via INTRAVENOUS
  Filled 2021-09-21: qty 2

## 2021-09-21 MED ORDER — BUPIVACAINE IN DEXTROSE 0.75-8.25 % IT SOLN
INTRATHECAL | Status: DC | PRN
  Administered 2021-09-21: 1.8 mL via INTRATHECAL

## 2021-09-21 MED ORDER — ONDANSETRON HCL 4 MG/2ML IJ SOLN: 4.0000 mg | Freq: Four times a day (QID) | INTRAMUSCULAR | Status: DC | PRN

## 2021-09-21 MED ORDER — BUPIVACAINE LIPOSOME 1.3 % IJ SUSP
INTRAMUSCULAR | Status: AC
  Filled 2021-09-21: qty 20

## 2021-09-21 MED ORDER — FENTANYL CITRATE (PF) 100 MCG/2ML IJ SOLN
INTRAMUSCULAR | Status: AC
  Filled 2021-09-21: qty 2

## 2021-09-21 MED ORDER — PROPOFOL 10 MG/ML IV BOLUS
INTRAVENOUS | Status: DC | PRN
  Administered 2021-09-21 (×2): 20 mg via INTRAVENOUS
  Administered 2021-09-21: 30 mg via INTRAVENOUS
  Administered 2021-09-21: 20 mg via INTRAVENOUS

## 2021-09-21 MED ORDER — HYDROCODONE-ACETAMINOPHEN 10-325 MG PO TABS: 1.0000 | ORAL_TABLET | ORAL | 0 refills | Status: DC | PRN

## 2021-09-21 MED ORDER — POVIDONE-IODINE 10 % EX SWAB
2.0000 "application " | Freq: Once | CUTANEOUS | Status: AC
  Administered 2021-09-21: 2 via TOPICAL

## 2021-09-21 MED ORDER — ROPIVACAINE HCL 5 MG/ML IJ SOLN
INTRAMUSCULAR | Status: DC | PRN
  Administered 2021-09-21: 20 mL via PERINEURAL

## 2021-09-21 MED ORDER — AMISULPRIDE (ANTIEMETIC) 5 MG/2ML IV SOLN: 10.0000 mg | Freq: Once | INTRAVENOUS | Status: DC | PRN

## 2021-09-21 MED ORDER — SURGIRINSE WOUND IRRIGATION SYSTEM - OPTIME: TOPICAL | Status: DC | PRN

## 2021-09-21 MED ORDER — DEXAMETHASONE SODIUM PHOSPHATE 10 MG/ML IJ SOLN
INTRAMUSCULAR | Status: DC | PRN
  Administered 2021-09-21: 10 mg via INTRAVENOUS

## 2021-09-21 MED ORDER — HYDROMORPHONE HCL 1 MG/ML IJ SOLN: 0.5000 mg | INTRAMUSCULAR | Status: DC | PRN

## 2021-09-21 MED ORDER — METHOCARBAMOL 500 MG PO TABS: 500.0000 mg | ORAL_TABLET | Freq: Four times a day (QID) | ORAL | Status: DC | PRN

## 2021-09-21 MED ORDER — EPHEDRINE SULFATE-NACL 50-0.9 MG/10ML-% IV SOSY
PREFILLED_SYRINGE | INTRAVENOUS | Status: DC | PRN
  Administered 2021-09-21: 10 mg via INTRAVENOUS

## 2021-09-21 MED ORDER — SODIUM CHLORIDE 0.9 % IR SOLN
Status: DC | PRN
  Administered 2021-09-21: 1000 mL

## 2021-09-21 MED ORDER — ONDANSETRON HCL 4 MG/2ML IJ SOLN
INTRAMUSCULAR | Status: AC
  Filled 2021-09-21: qty 2

## 2021-09-21 MED ORDER — SODIUM CHLORIDE (PF) 0.9 % IJ SOLN
INTRAMUSCULAR | Status: AC
  Filled 2021-09-21: qty 10

## 2021-09-21 MED ORDER — CEFAZOLIN SODIUM-DEXTROSE 2-4 GM/100ML-% IV SOLN: 2.0000 g | Freq: Four times a day (QID) | INTRAVENOUS | Status: DC

## 2021-09-21 MED ORDER — ASPIRIN EC 81 MG PO TBEC: 81.0000 mg | DELAYED_RELEASE_TABLET | Freq: Two times a day (BID) | ORAL | 0 refills | Status: AC

## 2021-09-21 MED ORDER — 0.9 % SODIUM CHLORIDE (POUR BTL) OPTIME
TOPICAL | Status: DC | PRN
  Administered 2021-09-21: 1000 mL

## 2021-09-21 MED ORDER — GLYCOPYRROLATE PF 0.2 MG/ML IJ SOSY
PREFILLED_SYRINGE | INTRAMUSCULAR | Status: DC | PRN
Start: 2021-09-21 — End: 2021-09-21
  Administered 2021-09-21: .2 mg via INTRAVENOUS

## 2021-09-21 MED ORDER — PROPOFOL 500 MG/50ML IV EMUL
INTRAVENOUS | Status: DC | PRN
  Administered 2021-09-21: 100 ug/kg/min via INTRAVENOUS

## 2021-09-21 MED ORDER — DOCUSATE SODIUM 100 MG PO CAPS: 100.0000 mg | ORAL_CAPSULE | Freq: Every day | ORAL | 2 refills | Status: AC | PRN

## 2021-09-21 MED ORDER — SODIUM CHLORIDE 0.9% FLUSH
INTRAVENOUS | Status: DC | PRN
  Administered 2021-09-21: 60 mL

## 2021-09-21 MED ORDER — CEFAZOLIN SODIUM-DEXTROSE 2-4 GM/100ML-% IV SOLN
2.0000 g | INTRAVENOUS | Status: AC
  Administered 2021-09-21: 2 g via INTRAVENOUS
  Filled 2021-09-21: qty 100

## 2021-09-21 MED ORDER — METHOCARBAMOL 500 MG IVPB - SIMPLE MED: 500.0000 mg | Freq: Four times a day (QID) | INTRAVENOUS | Status: DC | PRN

## 2021-09-21 SURGICAL SUPPLY — 62 items
ADH SKN CLS APL DERMABOND .7 (GAUZE/BANDAGES/DRESSINGS) ×1
APL PRP STRL LF DISP 70% ISPRP (MISCELLANEOUS) ×2
BAG COUNTER SPONGE SURGICOUNT (BAG) IMPLANT
BAG SPNG CNTER NS LX DISP (BAG)
BLADE HEX COATED 2.75 (ELECTRODE) ×2 IMPLANT
BLADE SAG 18X100X1.27 (BLADE) ×2 IMPLANT
BLADE SAW SAG 35X64 .89 (BLADE) ×2 IMPLANT
BNDG CMPR MED 10X6 ELC LF (GAUZE/BANDAGES/DRESSINGS) ×1
BNDG ELASTIC 6X10 VLCR STRL LF (GAUZE/BANDAGES/DRESSINGS) ×2 IMPLANT
BOWL SMART MIX CTS (DISPOSABLE) IMPLANT
BSPLAT TIB 5D E CMNT KN RT (Knees) ×1 IMPLANT
CEMENT BONE REFOBACIN R1X40 US (Cement) ×2 IMPLANT
CHLORAPREP W/TINT 26 (MISCELLANEOUS) ×4 IMPLANT
COMP FEM CMT CR PERS SZ8 RT (Joint) ×2 IMPLANT
COMPONENT FEM CMT CR PRS SZ8RT (Joint) IMPLANT
COOLER ICEMAN CLASSIC (MISCELLANEOUS) ×2 IMPLANT
COVER SURGICAL LIGHT HANDLE (MISCELLANEOUS) ×2 IMPLANT
CUFF TOURN SGL QUICK 34 (TOURNIQUET CUFF) ×2
CUFF TRNQT CYL 34X4.125X (TOURNIQUET CUFF) ×1 IMPLANT
DECANTER SPIKE VIAL GLASS SM (MISCELLANEOUS) ×2 IMPLANT
DERMABOND ADVANCED (GAUZE/BANDAGES/DRESSINGS) ×1
DERMABOND ADVANCED .7 DNX12 (GAUZE/BANDAGES/DRESSINGS) ×1 IMPLANT
DRAPE INCISE IOBAN 66X45 STRL (DRAPES) IMPLANT
DRAPE INCISE IOBAN 85X60 (DRAPES) ×2 IMPLANT
DRAPE SHEET LG 3/4 BI-LAMINATE (DRAPES) ×2 IMPLANT
DRAPE U-SHAPE 47X51 STRL (DRAPES) ×2 IMPLANT
DRESSING AQUACEL AG SP 3.5X10 (GAUZE/BANDAGES/DRESSINGS) ×1 IMPLANT
DRSG AQUACEL AG ADV 3.5X10 (GAUZE/BANDAGES/DRESSINGS) ×1 IMPLANT
DRSG AQUACEL AG SP 3.5X10 (GAUZE/BANDAGES/DRESSINGS) ×2
GLOVE SRG 8 PF TXTR STRL LF DI (GLOVE) ×1 IMPLANT
GLOVE SURG ENC MOIS LTX SZ8 (GLOVE) ×2 IMPLANT
GLOVE SURG UNDER POLY LF SZ8 (GLOVE) ×2
GOWN STRL REUS W/TWL XL LVL3 (GOWN DISPOSABLE) ×2 IMPLANT
HANDPIECE INTERPULSE COAX TIP (DISPOSABLE) ×2
HDLS TROCR DRIL PIN KNEE 75 (PIN) ×2
HOLDER FOLEY CATH W/STRAP (MISCELLANEOUS) IMPLANT
HOOD PEEL AWAY FLYTE STAYCOOL (MISCELLANEOUS) ×6 IMPLANT
INSERT TIB ARTI SZ8-11 RT 11 (Joint) ×1 IMPLANT
IRRIGATION SURGIPHOR STRL (IV SOLUTION) ×2 IMPLANT
MANIFOLD NEPTUNE II (INSTRUMENTS) ×2 IMPLANT
NS IRRIG 1000ML POUR BTL (IV SOLUTION) ×2 IMPLANT
PACK TOTAL KNEE CUSTOM (KITS) ×2 IMPLANT
PAD COLD SHLDR WRAP-ON (PAD) ×2 IMPLANT
PIN DRILL HDLS TROCAR 75 4PK (PIN) IMPLANT
PROTECTOR NERVE ULNAR (MISCELLANEOUS) ×2 IMPLANT
SCREW HEADED 33MM KNEE (MISCELLANEOUS) ×2 IMPLANT
SCREW HEADED 48MM KNEE (MISCELLANEOUS) ×2 IMPLANT
SET HNDPC FAN SPRY TIP SCT (DISPOSABLE) ×1 IMPLANT
STEM POLY PAT PLY 32M KNEE (Knees) ×1 IMPLANT
STEM TIBIA 5 DEG SZ E R KNEE (Knees) IMPLANT
SUT MNCRL AB 3-0 PS2 18 (SUTURE) ×2 IMPLANT
SUT STRATAFIX 0 PDS 27 VIOLET (SUTURE) ×2
SUT STRATAFIX 1PDS 45CM VIOLET (SUTURE) ×2 IMPLANT
SUT STRATAFIX PDO 1 14 VIOLET (SUTURE) ×2
SUT STRATFX PDO 1 14 VIOLET (SUTURE) ×1
SUT VIC AB 2-0 CT2 27 (SUTURE) ×4 IMPLANT
SUTURE STRATFX 0 PDS 27 VIOLET (SUTURE) ×1 IMPLANT
SUTURE STRATFX PDO 1 14 VIOLET (SUTURE) IMPLANT
TIBIA STEM 5 DEG SZ E R KNEE (Knees) ×2 IMPLANT
TRAY FOLEY MTR SLVR 14FR STAT (SET/KITS/TRAYS/PACK) IMPLANT
TRAY FOLEY MTR SLVR 16FR STAT (SET/KITS/TRAYS/PACK) IMPLANT
TUBE SUCTION HIGH CAP CLEAR NV (SUCTIONS) ×2 IMPLANT

## 2021-09-21 NOTE — Anesthesia Procedure Notes (Signed)
Anesthesia Regional Block: Adductor canal block   Pre-Anesthetic Checklist: , timeout performed,  Correct Patient, Correct Site, Correct Laterality,  Correct Procedure, Correct Position, site marked,  Risks and benefits discussed,  Surgical consent,  Pre-op evaluation,  At surgeon's request and post-op pain management  Laterality: Right  Prep: chloraprep       Needles:  Injection technique: Single-shot  Needle Type: Stimulator Needle - 80     Needle Length: 10cm  Needle Gauge: 21     Additional Needles:   Narrative:  Start time: 09/21/2021 8:28 AM End time: 09/21/2021 8:36 AM Injection made incrementally with aspirations every 5 mL.  Performed by: Personally  Anesthesiologist: Duane Boston, MD

## 2021-09-21 NOTE — Evaluation (Signed)
Physical Therapy Evaluation Patient Details Name: [name] MRN: [id number] DOB: 09/01/1961 Today's Date: 09/21/2021  History of Present Illness  Patient is 60 y.o. female s/p Rt TKA on 09/21/21 with PMH significant for anemia, anxiety, depression, fibromyalgia, OA, ACDF, back surgery, bil THA.   Clinical Impression  [name] is a 60 y.o. female POD 0 s/p Rt TKA. Patient reports independence with mobility at baseline. Patient is now limited by functional impairments (see PT problem list below) and requires min guard/supervision for transfers and gait with RW. Patient was able to ambulate ~100 feet with RW and min guard/supervision and cues for safe walker management. Patient educated on safe sequencing for stair mobility and verbalized safe guarding position for people assisting with mobility. Patient instructed in exercises to facilitate ROM and circulation. Patient will benefit from continued skilled PT interventions to address impairments and progress towards PLOF. Patient has met mobility goals at adequate level for discharge home; will continue to follow if pt continues acute stay to progress towards Mod I goals.        Recommendations for follow up therapy are one component of a multi-disciplinary discharge planning process, led by the attending physician.  Recommendations may be updated based on patient status, additional functional criteria and insurance authorization.  Follow Up Recommendations Follow physician's recommendations for discharge plan and follow up therapies    Assistance Recommended at Discharge Intermittent Supervision/Assistance  Functional Status Assessment Patient has had a recent decline in their functional status and demonstrates the ability to make significant improvements in function in a reasonable and predictable amount of time.  Equipment Recommendations  None recommended by PT    Recommendations for Other Services       Precautions /  Restrictions Precautions Precautions: Fall Restrictions Weight Bearing Restrictions: No Other Position/Activity Restrictions: WBAT      Mobility  Bed Mobility Overal bed mobility: Needs Assistance Bed Mobility: Supine to Sit     Supine to sit: Min guard          Transfers Overall transfer level: Needs assistance Equipment used: Rolling walker (2 wheels) Transfers: Sit to/from Stand Sit to Stand: Min guard;Supervision                Ambulation/Gait Ambulation/Gait assistance: Supervision;Min guard Gait Distance (Feet): 100 Feet Assistive device: Rolling walker (2 wheels)   Gait velocity: decr      Stairs  Educated on single curb negotiation with RW and forward step to pattern. Pt verbalized safe guarding position for family to provide.          Wheelchair Mobility    Modified Rankin (Stroke Patients Only)       Balance                                             Pertinent Vitals/Pain Pain Assessment: 0-10 Pain Score: 5  Pain Location: Rt knee Pain Descriptors / Indicators: Burning;Aching    Home Living Family/patient expects to be discharged to:: Private residence Living Arrangements: Spouse/significant other Available Help at Discharge: Family Type of Home: House Home Access: Stairs to enter Entrance Stairs-Rails: None Entrance Stairs-Number of Steps: 1   Home Layout: One level Home Equipment: Conservation officer, nature (2 wheels);BSC      Prior Function Prior Level of Function : Independent/Modified Independent  Hand Dominance   Dominant Hand: Right    Extremity/Trunk Assessment   Upper Extremity Assessment Upper Extremity Assessment: Overall WFL for tasks assessed    Lower Extremity Assessment Lower Extremity Assessment: RLE deficits/detail RLE Deficits / Details: good quad activation, no extensor lag with SLR RLE Sensation: WNL RLE Coordination: WNL    Cervical / Trunk  Assessment Cervical / Trunk Assessment: Normal  Communication   Communication: No difficulties  Cognition Arousal/Alertness: Awake/alert Behavior During Therapy: WFL for tasks assessed/performed Overall Cognitive Status: Within Functional Limits for tasks assessed                                          General Comments      Exercises     Assessment/Plan    PT Assessment Patient needs continued PT services  PT Problem List Decreased strength;Decreased activity tolerance;Decreased balance;Decreased range of motion;Decreased mobility;Decreased knowledge of use of DME;Decreased knowledge of precautions       PT Treatment Interventions DME instruction;Gait training;Stair training;Neuromuscular re-education;Therapeutic exercise;Therapeutic activities;Functional mobility training;Balance training;Patient/family education    PT Goals (Current goals can be found in the Care Plan section)  Acute Rehab PT Goals Patient Stated Goal: get home today PT Goal Formulation: With patient Time For Goal Achievement: 09/28/21 Potential to Achieve Goals: Good    Frequency 7X/week   Barriers to discharge        Co-evaluation               AM-PAC PT "6 Clicks" Mobility  Outcome Measure Help needed turning from your back to your side while in a flat bed without using bedrails?: A Little Help needed moving from lying on your back to sitting on the side of a flat bed without using bedrails?: A Little Help needed moving to and from a bed to a chair (including a wheelchair)?: A Little Help needed standing up from a chair using your arms (e.g., wheelchair or bedside chair)?: A Little Help needed to walk in hospital room?: A Little Help needed climbing 3-5 steps with a railing? : A Little 6 Click Score: 18    End of Session Equipment Utilized During Treatment: Gait belt Activity Tolerance: Patient tolerated treatment well Patient left: in chair;with call bell/phone  within reach Nurse Communication: Mobility status PT Visit Diagnosis: Muscle weakness (generalized) (M62.81);Difficulty in walking, not elsewhere classified (R26.2)    Time: 2119-4174 PT Time Calculation (min) (ACUTE ONLY): 25 min   Charges:   PT Evaluation $PT Eval Low Complexity: 1 Low PT Treatments $Gait Training: 8-22 mins        Verner Mould, DPT Acute Rehabilitation Services Office [phone] Pager [phone]   Jacques Navy 09/21/2021, 7:24 PM

## 2021-09-21 NOTE — Anesthesia Postprocedure Evaluation (Signed)
Anesthesia Post Note  Patient: [name]  Procedure(s) Performed: TOTAL KNEE ARTHROPLASTY (Right: Knee)     Patient location during evaluation: PACU Anesthesia Type: Spinal Level of consciousness: awake and alert Pain management: pain level controlled Vital Signs Assessment: post-procedure vital signs reviewed and stable Respiratory status: spontaneous breathing and respiratory function stable Cardiovascular status: blood pressure returned to baseline and stable Postop Assessment: spinal receding Anesthetic complications: no   No notable events documented.  Last Vitals:  Vitals:   09/21/21 1300 09/21/21 1315  BP: (!) 146/79 134/76  Pulse: 64 64  Resp: 11 12  Temp:  36.5 C  SpO2: 100% 100%    Last Pain:  Vitals:   09/21/21 1245  TempSrc:   PainSc: 0-No pain                 Abigale Dorow DANIEL

## 2021-09-21 NOTE — Progress Notes (Signed)
Patient asking to go home instead of being admitted writer talked with [name]  he obtained permission from surgeon for patient work with PT if she passes PT can go home with outpatient Physical theraphy to come to her home. So patient sent to phase 2 for possible discharge home.

## 2021-09-21 NOTE — Brief Op Note (Signed)
09/21/2021  11:53 AM  PATIENT:  [name]  60 y.o. female  PRE-OPERATIVE DIAGNOSIS:  OA RIGHT KNEE  POST-OPERATIVE DIAGNOSIS:  OA RIGHT KNEE  PROCEDURE:  Procedure(s): TOTAL KNEE ARTHROPLASTY (Right)  SURGEON:  Surgeon(s) and Role:    * Marchwiany, Virgina Norfolk, MD - Primary  PHYSICIAN ASSISTANT: Chriss Czar, PA-C  ASSISTANTS: OR staff x1   ANESTHESIA:   local, regional, spinal, and IV sedation  EBL:  50 mL   BLOOD ADMINISTERED:none  DRAINS: none   LOCAL MEDICATIONS USED:  MARCAINE     SPECIMEN:  No Specimen  DISPOSITION OF SPECIMEN:  N/A  COUNTS:  YES  TOURNIQUET:   Total Tourniquet Time Documented: Thigh (Right) - 75 minutes Total: Thigh (Right) - 75 minutes   DICTATION: .Viviann Spare Dictation  PLAN OF CARE: Admit for overnight observation  PATIENT DISPOSITION:  PACU - hemodynamically stable.   Delay start of Pharmacological VTE agent (>24hrs) due to surgical blood loss or risk of bleeding: yes

## 2021-09-21 NOTE — Progress Notes (Signed)
Orthopedic Tech Progress Note Patient Details:  [name] 1961-10-02 [id number]  Ortho Devices Type of Ortho Device: Bone foam zero knee Ortho Device/Splint Interventions: Application   Post Interventions Patient Tolerated: Well Instructions Provided: Care of device  [name] 09/21/2021, 11:48 AM

## 2021-09-21 NOTE — Transfer of Care (Signed)
Immediate Anesthesia Transfer of Care Note  Patient: [name]  Procedure(s) Performed: TOTAL KNEE ARTHROPLASTY (Right: Knee)  Patient Location: PACU  Anesthesia Type:Spinal and MAC combined with regional for post-op pain  Level of Consciousness: awake, alert , oriented and patient cooperative  Airway & Oxygen Therapy: Patient Spontanous Breathing and Patient connected to face mask oxygen  Post-op Assessment: Report given to RN and Post -op Vital signs reviewed and stable  Post vital signs: Reviewed and stable  Last Vitals:  Vitals Value Taken Time  BP 135/82 09/21/21 1154  Temp    Pulse 78 09/21/21 1155  Resp 12 09/21/21 1155  SpO2 97 % 09/21/21 1155  Vitals shown include unvalidated device data.  Last Pain:  Vitals:   09/21/21 0702  TempSrc: Oral  PainSc:          Complications: No notable events documented.

## 2021-09-21 NOTE — Op Note (Signed)
DATE OF SURGERY:  09/21/2021 TIME: 11:33 AM  PATIENT NAME:  [name]   AGE: 60 y.o.    PRE-OPERATIVE DIAGNOSIS:  Right knee osteoarthritis  POST-OPERATIVE DIAGNOSIS:  Same  PROCEDURE:  Right Total Knee Arthroplasty  SURGEON:  Louis Ivery A Taneah Masri, MD   ASSISTANT:  Chriss Czar, PA-C, present and scrubbed throughout the case, critical for assistance with exposure, retraction, instrumentation, and closure.   OPERATIVE IMPLANTS:  Implant Name Type Inv. Item Serial No. Manufacturer Lot No. LRB No. Used Action  CEMENT BONE REFOBACIN [id number] [place] - [id number] Cement CEMENT BONE REFOBACIN R1X40 [place]  [employer]IMMER [employer]ON(ORTH,TRAU,BIO,SG) [id number] Right 2 Implanted  STEM POLY PAT PLY 39M KNEE - [id number] Knees STEM POLY PAT PLY 39M KNEE  ZIMMER [employer]ON(ORTH,TRAU,BIO,SG) [id number] Right 1 Implanted  COMP FEM CMT CR PERS SZ8 RT - [id number] Joint COMP FEM CMT CR PERS SZ8 RT  ZIMMER [employer]ON(ORTH,TRAU,BIO,SG) [id number] Right 1 Implanted  TIBIA STEM 5 DEG SZ E R KNEE - [id number] Knees TIBIA STEM 5 DEG SZ E R KNEE  ZIMMER [employer]ON(ORTH,TRAU,BIO,SG) [id number] Right 1 Implanted  INSERT TIB ARTI SZ8-11 RT 11 - [id number] Joint INSERT TIB ARTI SZ8-11 RT 11  ZIMMER [employer]ON(ORTH,TRAU,BIO,SG) [id number] Right 1 Implanted      PREOPERATIVE INDICATIONS:  [name] is a 60 y.o. year old female with end stage bone on bone degenerative arthritis of the knee who failed conservative treatment, including injections, antiinflammatories, activity modification, and assistive devices, and had significant impairment of their activities of daily living, and elected for Total Knee Arthroplasty.   The risks, benefits, and alternatives were discussed at length including but not limited to the risks of infection, bleeding, nerve injury, stiffness, blood clots, the need for revision surgery, cardiopulmonary complications, among others, and they were willing to proceed.  OPERATIVE FINDINGS AND UNIQUE ASPECTS OF THE CASE: Grade 4  degenerative changes distal femoral condyle, posterior lateral tibial plateau and anteromedial   ESTIMATED BLOOD LOSS: 50cc  OPERATIVE DESCRIPTION:   Once adequate anesthesia, preoperative antibiotics, 2 gm of ancef,1 gm of Tranexamic Acid the patient was positioned supine with a right thigh tourniquet placed.  The right lower extremity was prepped and draped in sterile fashion.  A time-  out was performed identifying the patient, planned procedure, and the appropriate extremity.     The leg was  exsanguinated, tourniquet elevated to 250 mmHg.  A midline incision was  made followed by median parapatellar arthrotomy. Anterior horn of the medial meniscus was released and resected. A medial release was performed, the infrapatellar fat pad was resected with care taken to protect the patellar tendon. The suprapatellar fat was removed to exposed the distal anterior femur. The anterior horn of the lateral meniscus and ACL were released.     Following initial  exposure, attention was first to the femur.  The femoral   canal was opened with a drill, irrigated to try to prevent fat emboli.  An   intramedullary rod was passed set at 5 degrees valgus, 10 mm. The distal femur was resected.  Following this resection, the tibia was   subluxated anteriorly.  Using the extramedullary guide, 10 mm of bone was resected off   the proximal lateral tibia.  We confirmed the gap would be   stable medially and laterally with a size 12 spacer block as well as confirmed that the tibial cut was perpendicular in the coronal plane, checking with an alignment rod.      Once this was done, the posterior femoral  referencing femoral sizer was placed under to the posterior condyles with 3 degrees of external rotational. The femur was sized to be a size 8 in the anterior-  posterior dimension. A narrow was selected in the medial and lateral dimension.  The size 8 block was then pinned in position. The anterior, posterior, and  chamfer  cuts were made without difficulty nor notching making certain that I was along the anterior cortex to help   with flexion gap stability.       At this point, the tibia was sized to be a size E.  The size E tray was   then pinned in position. Trial reduction was now carried with a 8 femur,  E tibia, a 11 mm MC insert.  Attention was next directed to the patella.  Precut  measurement was noted to be 24 mm.  I resected down to 15 mm and used a  81mm patellar button to restore patellar height as well as cover the cut surface.     The lug holes were drilled and a 32 mm patella poly trial was placed.    The knee was brought to full extension with good flexion stability with the patella  tracking through the trochlea without application of pressure.     Next the femoral component was again assessed and determined to be seated and appropriately lateralized.  The femoral lug holes were drilled.  The femoral component was then removed. Tibial component was again assessed and felt to be seated and appropriately rotated  with the medial third of the tubercle. The tibia was then drilled, and keel punched.     Final components were  opened and cement was mixed.  The knee was irrigated with normal saline solution and pulse lavage.  The synovial lining was  then injected a dilute Exparel.     Final implants were then cemented onto cleaned and dried cut surfaces of bone with the knee brought to extension with a 11 mm MC poly.     Once the cement had fully cured, excess cement was removed   throughout the knee.  I confirmed that I was satisfied with the range of   motion and stability, and the final 11 mm MC poly insert was chosen.  It was   placed into the knee.         The tourniquet had been let down.  No significant   hemostasis was required.  The medial parapatellar arthrotomy was then reapproximated using #1 Vicryl and #1 Stratafix sutures with the knee  in flexion.  The  remaining wound was closed  with 0 stratafix, 2-0 Vicryl, and running 3-0 Monocryl.  The knee was cleaned, dried, dressed sterilely using Dermabond and  Aquacel dressing.  The patient was then  brought to recovery room in stable condition, tolerating the procedure  well. There were no complications.   Post op recs: WB: WBAT Abx: ancef x23 hours post op Imaging: PACU xrays DVT prophylaxis: Aspirin 81mg  BID x4 weeks Follow up: 2 weeks after surgery for a wound check with Dr. Zachery Dakins at [facility].  Address: [address], [place], [place] [address]  Office Phone: [phone]  Charlies Constable, MD Orthopaedic Surgery

## 2021-09-21 NOTE — Anesthesia Procedure Notes (Signed)
Spinal  Patient location during procedure: OR Reason for block: surgical anesthesia Staffing Performed: resident/CRNA  Resident/CRNA: Cleda Daub, CRNA Preanesthetic Checklist Completed: patient identified, IV checked, site marked, risks and benefits discussed, surgical consent, monitors and equipment checked, pre-op evaluation and timeout performed Spinal Block Patient position: sitting Prep: DuraPrep Patient monitoring: heart rate, cardiac monitor, continuous pulse ox and blood pressure Approach: midline Location: L3-4 Injection technique: single-shot Needle Needle type: Pencan  Needle gauge: 24 G Needle length: 10 cm Assessment Sensory level: T4 Events: CSF return Additional Notes Checked spinal kit and dura prep expiration dates; full monitor; supplemental O2 via FM; maintained sterile technique throughout; anesthetized skin with 1% lido; clear CSF prior to injection; swirl seen during injection. Pt tolerated well.

## 2021-09-21 NOTE — Discharge Instructions (Addendum)

## 2021-09-21 NOTE — Progress Notes (Signed)
Dr. Zachery Dakins was at bedside speaking to patient about her discharge

## 2021-09-21 NOTE — Progress Notes (Signed)
Assisted Dr. Duane Boston with  Right Knee Adductor Canal block. Side rails up, monitors on throughout procedure. See vital signs in flow sheet. Tolerated Procedure well.

## 2021-09-21 NOTE — Interval H&P Note (Signed)
The risks benefits and alternatives were discussed with the patient including but not limited to the risks of nonoperative treatment, versus surgical intervention including infection, bleeding, nerve injury, malunion, nonunion, the need for revision surgery, hardware prominence, hardware failure, the need for hardware removal, blood clots, cardiopulmonary complications, morbidity, mortality, among others, and they were willing to proceed.  Consent was signed by myself and the patient.  Right knee was marked.

## 2021-09-23 ENCOUNTER — Encounter (HOSPITAL_COMMUNITY): Payer: Self-pay | Admitting: Orthopedic Surgery

## 2021-09-23 DIAGNOSIS — M6281 Muscle weakness (generalized): Secondary | ICD-10-CM | POA: Diagnosis not present

## 2021-09-23 DIAGNOSIS — M25661 Stiffness of right knee, not elsewhere classified: Secondary | ICD-10-CM | POA: Diagnosis not present

## 2021-09-23 DIAGNOSIS — M1711 Unilateral primary osteoarthritis, right knee: Secondary | ICD-10-CM | POA: Diagnosis not present

## 2021-09-23 DIAGNOSIS — Z96651 Presence of right artificial knee joint: Secondary | ICD-10-CM | POA: Diagnosis not present

## 2021-09-23 DIAGNOSIS — M25561 Pain in right knee: Secondary | ICD-10-CM | POA: Diagnosis not present

## 2021-09-23 DIAGNOSIS — R262 Difficulty in walking, not elsewhere classified: Secondary | ICD-10-CM | POA: Diagnosis not present

## 2021-09-25 DIAGNOSIS — R262 Difficulty in walking, not elsewhere classified: Secondary | ICD-10-CM | POA: Diagnosis not present

## 2021-09-25 DIAGNOSIS — Z96651 Presence of right artificial knee joint: Secondary | ICD-10-CM | POA: Diagnosis not present

## 2021-09-25 DIAGNOSIS — M25561 Pain in right knee: Secondary | ICD-10-CM | POA: Diagnosis not present

## 2021-09-25 DIAGNOSIS — M6281 Muscle weakness (generalized): Secondary | ICD-10-CM | POA: Diagnosis not present

## 2021-09-25 DIAGNOSIS — M1711 Unilateral primary osteoarthritis, right knee: Secondary | ICD-10-CM | POA: Diagnosis not present

## 2021-09-25 DIAGNOSIS — M25661 Stiffness of right knee, not elsewhere classified: Secondary | ICD-10-CM | POA: Diagnosis not present

## 2021-09-28 DIAGNOSIS — Z96651 Presence of right artificial knee joint: Secondary | ICD-10-CM | POA: Diagnosis not present

## 2021-09-28 DIAGNOSIS — Z6824 Body mass index (BMI) 24.0-24.9, adult: Secondary | ICD-10-CM | POA: Diagnosis not present

## 2021-09-28 DIAGNOSIS — Z96652 Presence of left artificial knee joint: Secondary | ICD-10-CM | POA: Diagnosis not present

## 2021-09-28 DIAGNOSIS — G43909 Migraine, unspecified, not intractable, without status migrainosus: Secondary | ICD-10-CM | POA: Diagnosis not present

## 2021-09-28 DIAGNOSIS — M1711 Unilateral primary osteoarthritis, right knee: Secondary | ICD-10-CM | POA: Diagnosis not present

## 2021-09-28 DIAGNOSIS — M6281 Muscle weakness (generalized): Secondary | ICD-10-CM | POA: Diagnosis not present

## 2021-09-28 DIAGNOSIS — M25661 Stiffness of right knee, not elsewhere classified: Secondary | ICD-10-CM | POA: Diagnosis not present

## 2021-09-28 DIAGNOSIS — M25561 Pain in right knee: Secondary | ICD-10-CM | POA: Diagnosis not present

## 2021-09-28 DIAGNOSIS — R262 Difficulty in walking, not elsewhere classified: Secondary | ICD-10-CM | POA: Diagnosis not present

## 2021-09-28 DIAGNOSIS — M961 Postlaminectomy syndrome, not elsewhere classified: Secondary | ICD-10-CM | POA: Diagnosis not present

## 2021-10-05 DIAGNOSIS — M961 Postlaminectomy syndrome, not elsewhere classified: Secondary | ICD-10-CM | POA: Diagnosis not present

## 2021-10-05 DIAGNOSIS — Z6824 Body mass index (BMI) 24.0-24.9, adult: Secondary | ICD-10-CM | POA: Diagnosis not present

## 2021-10-05 DIAGNOSIS — G43909 Migraine, unspecified, not intractable, without status migrainosus: Secondary | ICD-10-CM | POA: Diagnosis not present

## 2021-10-05 DIAGNOSIS — Z96652 Presence of left artificial knee joint: Secondary | ICD-10-CM | POA: Diagnosis not present

## 2021-10-05 DIAGNOSIS — F17210 Nicotine dependence, cigarettes, uncomplicated: Secondary | ICD-10-CM | POA: Diagnosis not present

## 2021-10-05 DIAGNOSIS — Z79899 Other long term (current) drug therapy: Secondary | ICD-10-CM | POA: Diagnosis not present

## 2021-10-05 DIAGNOSIS — M1711 Unilateral primary osteoarthritis, right knee: Secondary | ICD-10-CM | POA: Diagnosis not present

## 2021-10-08 DIAGNOSIS — Z79899 Other long term (current) drug therapy: Secondary | ICD-10-CM | POA: Diagnosis not present

## 2021-10-19 DIAGNOSIS — G43909 Migraine, unspecified, not intractable, without status migrainosus: Secondary | ICD-10-CM | POA: Diagnosis not present

## 2021-10-19 DIAGNOSIS — F17210 Nicotine dependence, cigarettes, uncomplicated: Secondary | ICD-10-CM | POA: Diagnosis not present

## 2021-10-19 DIAGNOSIS — M961 Postlaminectomy syndrome, not elsewhere classified: Secondary | ICD-10-CM | POA: Diagnosis not present

## 2021-10-19 DIAGNOSIS — Z96652 Presence of left artificial knee joint: Secondary | ICD-10-CM | POA: Diagnosis not present

## 2021-10-19 DIAGNOSIS — Z79899 Other long term (current) drug therapy: Secondary | ICD-10-CM | POA: Diagnosis not present

## 2021-10-19 DIAGNOSIS — M1711 Unilateral primary osteoarthritis, right knee: Secondary | ICD-10-CM | POA: Diagnosis not present

## 2021-10-19 DIAGNOSIS — Z6824 Body mass index (BMI) 24.0-24.9, adult: Secondary | ICD-10-CM | POA: Diagnosis not present

## 2021-10-21 DIAGNOSIS — Z79899 Other long term (current) drug therapy: Secondary | ICD-10-CM | POA: Diagnosis not present

## 2021-10-23 DIAGNOSIS — E785 Hyperlipidemia, unspecified: Secondary | ICD-10-CM | POA: Diagnosis not present

## 2021-10-23 DIAGNOSIS — R739 Hyperglycemia, unspecified: Secondary | ICD-10-CM | POA: Diagnosis not present

## 2021-10-23 DIAGNOSIS — Z131 Encounter for screening for diabetes mellitus: Secondary | ICD-10-CM | POA: Diagnosis not present

## 2021-10-23 DIAGNOSIS — D229 Melanocytic nevi, unspecified: Secondary | ICD-10-CM | POA: Diagnosis not present

## 2021-10-23 DIAGNOSIS — Z0000 Encounter for general adult medical examination without abnormal findings: Secondary | ICD-10-CM | POA: Diagnosis not present

## 2021-11-09 DIAGNOSIS — Z96652 Presence of left artificial knee joint: Secondary | ICD-10-CM | POA: Diagnosis not present

## 2021-11-09 DIAGNOSIS — Z6824 Body mass index (BMI) 24.0-24.9, adult: Secondary | ICD-10-CM | POA: Diagnosis not present

## 2021-11-09 DIAGNOSIS — M1711 Unilateral primary osteoarthritis, right knee: Secondary | ICD-10-CM | POA: Diagnosis not present

## 2021-11-09 DIAGNOSIS — F17210 Nicotine dependence, cigarettes, uncomplicated: Secondary | ICD-10-CM | POA: Diagnosis not present

## 2021-11-09 DIAGNOSIS — M961 Postlaminectomy syndrome, not elsewhere classified: Secondary | ICD-10-CM | POA: Diagnosis not present

## 2021-11-09 DIAGNOSIS — G43909 Migraine, unspecified, not intractable, without status migrainosus: Secondary | ICD-10-CM | POA: Diagnosis not present

## 2021-11-09 DIAGNOSIS — Z79899 Other long term (current) drug therapy: Secondary | ICD-10-CM | POA: Diagnosis not present

## 2021-11-26 DIAGNOSIS — Z780 Asymptomatic menopausal state: Secondary | ICD-10-CM | POA: Diagnosis not present

## 2021-12-10 DIAGNOSIS — Z96652 Presence of left artificial knee joint: Secondary | ICD-10-CM | POA: Diagnosis not present

## 2021-12-10 DIAGNOSIS — M961 Postlaminectomy syndrome, not elsewhere classified: Secondary | ICD-10-CM | POA: Diagnosis not present

## 2021-12-10 DIAGNOSIS — Z6824 Body mass index (BMI) 24.0-24.9, adult: Secondary | ICD-10-CM | POA: Diagnosis not present

## 2021-12-10 DIAGNOSIS — Z79899 Other long term (current) drug therapy: Secondary | ICD-10-CM | POA: Diagnosis not present

## 2021-12-10 DIAGNOSIS — F17210 Nicotine dependence, cigarettes, uncomplicated: Secondary | ICD-10-CM | POA: Diagnosis not present

## 2021-12-10 DIAGNOSIS — M1711 Unilateral primary osteoarthritis, right knee: Secondary | ICD-10-CM | POA: Diagnosis not present

## 2021-12-10 DIAGNOSIS — G43909 Migraine, unspecified, not intractable, without status migrainosus: Secondary | ICD-10-CM | POA: Diagnosis not present

## 2021-12-23 ENCOUNTER — Inpatient Hospital Stay (HOSPITAL_COMMUNITY)
Admission: EM | Admit: 2021-12-23 | Discharge: 2021-12-27 | DRG: 493 | Disposition: A | Discharge status: Home Health | Payer: Medicare HMO | Attending: Student | Admitting: Student

## 2021-12-23 DIAGNOSIS — S82245A Nondisplaced spiral fracture of shaft of left tibia, initial encounter for closed fracture: Secondary | ICD-10-CM | POA: Diagnosis not present

## 2021-12-23 DIAGNOSIS — W1830XA Fall on same level, unspecified, initial encounter: Secondary | ICD-10-CM | POA: Diagnosis present

## 2021-12-23 DIAGNOSIS — Z981 Arthrodesis status: Secondary | ICD-10-CM

## 2021-12-23 DIAGNOSIS — S82302A Unspecified fracture of lower end of left tibia, initial encounter for closed fracture: Secondary | ICD-10-CM | POA: Diagnosis not present

## 2021-12-23 DIAGNOSIS — E872 Acidosis, unspecified: Secondary | ICD-10-CM | POA: Diagnosis present

## 2021-12-23 DIAGNOSIS — F418 Other specified anxiety disorders: Secondary | ICD-10-CM | POA: Diagnosis not present

## 2021-12-23 DIAGNOSIS — Z96643 Presence of artificial hip joint, bilateral: Secondary | ICD-10-CM | POA: Diagnosis present

## 2021-12-23 DIAGNOSIS — M9712XA Periprosthetic fracture around internal prosthetic left knee joint, initial encounter: Secondary | ICD-10-CM | POA: Diagnosis not present

## 2021-12-23 DIAGNOSIS — Z419 Encounter for procedure for purposes other than remedying health state, unspecified: Secondary | ICD-10-CM

## 2021-12-23 DIAGNOSIS — D649 Anemia, unspecified: Secondary | ICD-10-CM | POA: Diagnosis not present

## 2021-12-23 DIAGNOSIS — M797 Fibromyalgia: Secondary | ICD-10-CM | POA: Diagnosis present

## 2021-12-23 DIAGNOSIS — T1490XA Injury, unspecified, initial encounter: Secondary | ICD-10-CM | POA: Diagnosis present

## 2021-12-23 DIAGNOSIS — Z96653 Presence of artificial knee joint, bilateral: Secondary | ICD-10-CM | POA: Diagnosis present

## 2021-12-23 DIAGNOSIS — Y92009 Unspecified place in unspecified non-institutional (private) residence as the place of occurrence of the external cause: Secondary | ICD-10-CM

## 2021-12-23 DIAGNOSIS — Z885 Allergy status to narcotic agent status: Secondary | ICD-10-CM | POA: Diagnosis not present

## 2021-12-23 DIAGNOSIS — Z886 Allergy status to analgesic agent status: Secondary | ICD-10-CM | POA: Diagnosis not present

## 2021-12-23 DIAGNOSIS — Z20822 Contact with and (suspected) exposure to covid-19: Secondary | ICD-10-CM | POA: Diagnosis present

## 2021-12-23 DIAGNOSIS — R531 Weakness: Secondary | ICD-10-CM | POA: Diagnosis not present

## 2021-12-23 DIAGNOSIS — E871 Hypo-osmolality and hyponatremia: Secondary | ICD-10-CM | POA: Diagnosis present

## 2021-12-23 DIAGNOSIS — N179 Acute kidney failure, unspecified: Secondary | ICD-10-CM | POA: Diagnosis present

## 2021-12-23 DIAGNOSIS — G8918 Other acute postprocedural pain: Secondary | ICD-10-CM | POA: Diagnosis not present

## 2021-12-23 DIAGNOSIS — S82302D Unspecified fracture of lower end of left tibia, subsequent encounter for closed fracture with routine healing: Secondary | ICD-10-CM | POA: Diagnosis not present

## 2021-12-23 DIAGNOSIS — Z87891 Personal history of nicotine dependence: Secondary | ICD-10-CM | POA: Diagnosis not present

## 2021-12-23 DIAGNOSIS — S82202B Unspecified fracture of shaft of left tibia, initial encounter for open fracture type I or II: Secondary | ICD-10-CM | POA: Diagnosis not present

## 2021-12-23 DIAGNOSIS — D62 Acute posthemorrhagic anemia: Secondary | ICD-10-CM | POA: Diagnosis not present

## 2021-12-23 DIAGNOSIS — S82832B Other fracture of upper and lower end of left fibula, initial encounter for open fracture type I or II: Secondary | ICD-10-CM | POA: Diagnosis present

## 2021-12-23 DIAGNOSIS — T148XXA Other injury of unspecified body region, initial encounter: Secondary | ICD-10-CM

## 2021-12-23 DIAGNOSIS — R42 Dizziness and giddiness: Secondary | ICD-10-CM | POA: Diagnosis not present

## 2021-12-23 DIAGNOSIS — R609 Edema, unspecified: Secondary | ICD-10-CM | POA: Diagnosis not present

## 2021-12-23 DIAGNOSIS — S82245B Nondisplaced spiral fracture of shaft of left tibia, initial encounter for open fracture type I or II: Secondary | ICD-10-CM

## 2021-12-23 DIAGNOSIS — G8929 Other chronic pain: Secondary | ICD-10-CM | POA: Diagnosis not present

## 2021-12-23 DIAGNOSIS — S82252B Displaced comminuted fracture of shaft of left tibia, initial encounter for open fracture type I or II: Principal | ICD-10-CM | POA: Diagnosis present

## 2021-12-23 DIAGNOSIS — S82252A Displaced comminuted fracture of shaft of left tibia, initial encounter for closed fracture: Secondary | ICD-10-CM | POA: Diagnosis not present

## 2021-12-23 DIAGNOSIS — R58 Hemorrhage, not elsewhere classified: Secondary | ICD-10-CM | POA: Diagnosis not present

## 2021-12-23 DIAGNOSIS — Z96642 Presence of left artificial hip joint: Secondary | ICD-10-CM | POA: Diagnosis not present

## 2021-12-23 DIAGNOSIS — S82209A Unspecified fracture of shaft of unspecified tibia, initial encounter for closed fracture: Secondary | ICD-10-CM | POA: Diagnosis present

## 2021-12-23 DIAGNOSIS — I959 Hypotension, unspecified: Secondary | ICD-10-CM | POA: Diagnosis not present

## 2021-12-23 LAB — I-STAT CHEM 8, ED
BUN: 12 mg/dL (ref 6–20)
Calcium, Ion: 1.09 mmol/L — ABNORMAL LOW (ref 1.15–1.40)
Chloride: 90 mmol/L — ABNORMAL LOW (ref 98–111)
Creatinine, Ser: 1.1 mg/dL — ABNORMAL HIGH (ref 0.44–1.00)
Glucose, Bld: 98 mg/dL (ref 70–99)
HCT: 39 % (ref 36.0–46.0)
Hemoglobin: 13.3 g/dL (ref 12.0–15.0)
Potassium: 3 mmol/L — ABNORMAL LOW (ref 3.5–5.1)
Sodium: 127 mmol/L — ABNORMAL LOW (ref 135–145)
TCO2: 24 mmol/L (ref 22–32)

## 2021-12-23 MED ORDER — SODIUM CHLORIDE 0.9 % IV BOLUS
1000.0000 mL | Freq: Once | INTRAVENOUS | Status: AC
  Administered 2021-12-23: 1000 mL via INTRAVENOUS

## 2021-12-23 MED ORDER — FENTANYL CITRATE PF 50 MCG/ML IJ SOSY
PREFILLED_SYRINGE | INTRAMUSCULAR | Status: AC
  Filled 2021-12-23: qty 1

## 2021-12-23 MED ORDER — FENTANYL CITRATE PF 50 MCG/ML IJ SOSY
50.0000 ug | PREFILLED_SYRINGE | Freq: Once | INTRAMUSCULAR | Status: AC
  Administered 2021-12-23: 50 ug via INTRAVENOUS

## 2021-12-23 NOTE — ED Triage Notes (Signed)
Pt bib gcems as level 2 trauma. Pt fell from standing position. Open fracture to L tibia. Bleeding  controlled with tourniquet. C collar in place on arrival. Pt orginally hypotensive at 66/30. 250 ml NS given PTA. BP 92/55 with ems. 18 g lac.

## 2021-12-23 NOTE — ED Notes (Signed)
Pressure dressing applied - tourniquet removed.

## 2021-12-23 NOTE — ED Notes (Signed)
C-collar removed by MD

## 2021-12-24 ENCOUNTER — Inpatient Hospital Stay (HOSPITAL_COMMUNITY): Payer: Medicare HMO | Admitting: Anesthesiology

## 2021-12-24 ENCOUNTER — Encounter (HOSPITAL_COMMUNITY): Payer: Self-pay | Admitting: Orthopaedic Surgery

## 2021-12-24 ENCOUNTER — Other Ambulatory Visit: Payer: Self-pay

## 2021-12-24 ENCOUNTER — Inpatient Hospital Stay (HOSPITAL_COMMUNITY): Payer: Medicare HMO

## 2021-12-24 ENCOUNTER — Encounter (HOSPITAL_COMMUNITY)
Admission: EM | Disposition: A | Discharge status: Home Health | Payer: Self-pay | Source: Home / Self Care | Attending: Student

## 2021-12-24 ENCOUNTER — Emergency Department (HOSPITAL_COMMUNITY): Payer: Medicare HMO

## 2021-12-24 DIAGNOSIS — S82832B Other fracture of upper and lower end of left fibula, initial encounter for open fracture type I or II: Secondary | ICD-10-CM | POA: Diagnosis present

## 2021-12-24 DIAGNOSIS — S82252B Displaced comminuted fracture of shaft of left tibia, initial encounter for open fracture type I or II: Secondary | ICD-10-CM | POA: Diagnosis present

## 2021-12-24 DIAGNOSIS — Z20822 Contact with and (suspected) exposure to covid-19: Secondary | ICD-10-CM | POA: Diagnosis present

## 2021-12-24 DIAGNOSIS — Z981 Arthrodesis status: Secondary | ICD-10-CM | POA: Diagnosis not present

## 2021-12-24 DIAGNOSIS — T1490XA Injury, unspecified, initial encounter: Secondary | ICD-10-CM | POA: Diagnosis present

## 2021-12-24 DIAGNOSIS — M797 Fibromyalgia: Secondary | ICD-10-CM | POA: Diagnosis present

## 2021-12-24 DIAGNOSIS — S82209A Unspecified fracture of shaft of unspecified tibia, initial encounter for closed fracture: Secondary | ICD-10-CM | POA: Diagnosis present

## 2021-12-24 DIAGNOSIS — M9712XA Periprosthetic fracture around internal prosthetic left knee joint, initial encounter: Secondary | ICD-10-CM | POA: Diagnosis present

## 2021-12-24 DIAGNOSIS — W1830XA Fall on same level, unspecified, initial encounter: Secondary | ICD-10-CM | POA: Diagnosis present

## 2021-12-24 DIAGNOSIS — Z96643 Presence of artificial hip joint, bilateral: Secondary | ICD-10-CM | POA: Diagnosis present

## 2021-12-24 DIAGNOSIS — S82252A Displaced comminuted fracture of shaft of left tibia, initial encounter for closed fracture: Secondary | ICD-10-CM | POA: Diagnosis not present

## 2021-12-24 DIAGNOSIS — Z96653 Presence of artificial knee joint, bilateral: Secondary | ICD-10-CM | POA: Diagnosis present

## 2021-12-24 DIAGNOSIS — S82302D Unspecified fracture of lower end of left tibia, subsequent encounter for closed fracture with routine healing: Secondary | ICD-10-CM | POA: Diagnosis not present

## 2021-12-24 DIAGNOSIS — Z886 Allergy status to analgesic agent status: Secondary | ICD-10-CM | POA: Diagnosis not present

## 2021-12-24 DIAGNOSIS — Z885 Allergy status to narcotic agent status: Secondary | ICD-10-CM | POA: Diagnosis not present

## 2021-12-24 DIAGNOSIS — Z87891 Personal history of nicotine dependence: Secondary | ICD-10-CM | POA: Diagnosis not present

## 2021-12-24 DIAGNOSIS — N179 Acute kidney failure, unspecified: Secondary | ICD-10-CM | POA: Diagnosis present

## 2021-12-24 DIAGNOSIS — D62 Acute posthemorrhagic anemia: Secondary | ICD-10-CM | POA: Diagnosis not present

## 2021-12-24 DIAGNOSIS — S82302A Unspecified fracture of lower end of left tibia, initial encounter for closed fracture: Secondary | ICD-10-CM | POA: Diagnosis not present

## 2021-12-24 DIAGNOSIS — E872 Acidosis, unspecified: Secondary | ICD-10-CM | POA: Diagnosis present

## 2021-12-24 DIAGNOSIS — S82409A Unspecified fracture of shaft of unspecified fibula, initial encounter for closed fracture: Secondary | ICD-10-CM | POA: Diagnosis present

## 2021-12-24 DIAGNOSIS — E871 Hypo-osmolality and hyponatremia: Secondary | ICD-10-CM | POA: Diagnosis present

## 2021-12-24 DIAGNOSIS — Y92009 Unspecified place in unspecified non-institutional (private) residence as the place of occurrence of the external cause: Secondary | ICD-10-CM | POA: Diagnosis not present

## 2021-12-24 HISTORY — PX: TIBIA IM NAIL INSERTION: SHX2516

## 2021-12-24 HISTORY — PX: I & D EXTREMITY: SHX5045

## 2021-12-24 LAB — CBC
HCT: 35.6 % — ABNORMAL LOW (ref 36.0–46.0)
Hemoglobin: 12.1 g/dL (ref 12.0–15.0)
MCH: 30.2 pg (ref 26.0–34.0)
MCHC: 34 g/dL (ref 30.0–36.0)
MCV: 88.8 fL (ref 80.0–100.0)
Platelets: 130 10*3/uL — ABNORMAL LOW (ref 150–400)
RBC: 4.01 MIL/uL (ref 3.87–5.11)
RDW: 14.2 % (ref 11.5–15.5)
WBC: 7.1 10*3/uL (ref 4.0–10.5)
nRBC: 0 % (ref 0.0–0.2)

## 2021-12-24 LAB — COMPREHENSIVE METABOLIC PANEL
ALT: 12 U/L (ref 0–44)
AST: 20 U/L (ref 15–41)
Albumin: 3.9 g/dL (ref 3.5–5.0)
Alkaline Phosphatase: 63 U/L (ref 38–126)
Anion gap: 13 (ref 5–15)
BUN: 11 mg/dL (ref 6–20)
CO2: 22 mmol/L (ref 22–32)
Calcium: 8.9 mg/dL (ref 8.9–10.3)
Chloride: 92 mmol/L — ABNORMAL LOW (ref 98–111)
Creatinine, Ser: 1 mg/dL (ref 0.44–1.00)
GFR, Estimated: >60 mL/min (ref >60)
Glucose, Bld: 103 mg/dL — ABNORMAL HIGH (ref 70–99)
Potassium: 3.2 mmol/L — ABNORMAL LOW (ref 3.5–5.1)
Sodium: 127 mmol/L — ABNORMAL LOW (ref 135–145)
Total Bilirubin: 0.4 mg/dL (ref 0.3–1.2)
Total Protein: 6.1 g/dL — ABNORMAL LOW (ref 6.5–8.1)

## 2021-12-24 LAB — LACTIC ACID, PLASMA
Lactic Acid, Venous: 0.6 mmol/L (ref 0.5–1.9)
Lactic Acid, Venous: 4 mmol/L (ref 0.5–1.9)
Lactic Acid, Venous: 4.8 mmol/L (ref 0.5–1.9)

## 2021-12-24 LAB — HIV ANTIBODY (ROUTINE TESTING W REFLEX): HIV Screen 4th Generation wRfx: NONREACTIVE

## 2021-12-24 LAB — ETHANOL: Alcohol, Ethyl (B): 110 mg/dL — ABNORMAL HIGH (ref <10)

## 2021-12-24 LAB — URINALYSIS, ROUTINE W REFLEX MICROSCOPIC
Bilirubin Urine: NEGATIVE
Glucose, UA: NEGATIVE mg/dL
Ketones, ur: NEGATIVE mg/dL
Leukocytes,Ua: NEGATIVE
Nitrite: NEGATIVE
Protein, ur: NEGATIVE mg/dL
Specific Gravity, Urine: <1.005 — ABNORMAL LOW (ref 1.005–1.030)
pH: 6 (ref 5.0–8.0)

## 2021-12-24 LAB — RESP PANEL BY RT-PCR (FLU A&B, COVID) ARPGX2
Influenza A by PCR: NEGATIVE
Influenza B by PCR: NEGATIVE
SARS Coronavirus 2 by RT PCR: NEGATIVE

## 2021-12-24 LAB — PROTIME-INR
INR: 1 (ref 0.8–1.2)
Prothrombin Time: 12.7 seconds (ref 11.4–15.2)

## 2021-12-24 LAB — URINALYSIS, MICROSCOPIC (REFLEX): Bacteria, UA: NONE SEEN

## 2021-12-24 SURGERY — INSERTION, INTRAMEDULLARY ROD, TIBIA
Anesthesia: General | Laterality: Left

## 2021-12-24 MED ORDER — SUGAMMADEX SODIUM 200 MG/2ML IV SOLN
INTRAVENOUS | Status: DC | PRN
  Administered 2021-12-24: 100 mg via INTRAVENOUS

## 2021-12-24 MED ORDER — DIPHENHYDRAMINE HCL 12.5 MG/5ML PO ELIX: 12.5000 mg | ORAL_SOLUTION | ORAL | Status: DC | PRN

## 2021-12-24 MED ORDER — HYDROCODONE-ACETAMINOPHEN 7.5-325 MG PO TABS: 1.0000 | ORAL_TABLET | ORAL | Status: DC | PRN

## 2021-12-24 MED ORDER — DOCUSATE SODIUM 100 MG PO CAPS: 100.0000 mg | ORAL_CAPSULE | Freq: Two times a day (BID) | ORAL | Status: DC

## 2021-12-24 MED ORDER — VANCOMYCIN HCL 1000 MG IV SOLR
INTRAVENOUS | Status: DC | PRN
  Administered 2021-12-24: 1000 mg via TOPICAL

## 2021-12-24 MED ORDER — HYDROCODONE-ACETAMINOPHEN 5-325 MG PO TABS: 1.0000 | ORAL_TABLET | ORAL | Status: DC | PRN

## 2021-12-24 MED ORDER — CEFAZOLIN SODIUM-DEXTROSE 2-4 GM/100ML-% IV SOLN
2.0000 g | Freq: Three times a day (TID) | INTRAVENOUS | Status: DC
  Administered 2021-12-24: 2 g via INTRAVENOUS
  Filled 2021-12-24: qty 100

## 2021-12-24 MED ORDER — ONDANSETRON HCL 4 MG/2ML IJ SOLN
4.0000 mg | Freq: Four times a day (QID) | INTRAMUSCULAR | Status: DC | PRN
  Administered 2021-12-24 – 2021-12-27 (×4): 4 mg via INTRAVENOUS
  Filled 2021-12-24 (×4): qty 2

## 2021-12-24 MED ORDER — MIDAZOLAM HCL 2 MG/2ML IJ SOLN
2.0000 mg | Freq: Once | INTRAMUSCULAR | Status: AC
Start: 2021-12-24 — End: 2021-12-24

## 2021-12-24 MED ORDER — SUMATRIPTAN SUCCINATE 100 MG PO TABS
100.0000 mg | ORAL_TABLET | ORAL | Status: DC | PRN
  Filled 2021-12-24: qty 1

## 2021-12-24 MED ORDER — KETAMINE HCL 10 MG/ML IJ SOLN
INTRAMUSCULAR | Status: DC | PRN
  Administered 2021-12-24 (×2): 10 mg via INTRAVENOUS

## 2021-12-24 MED ORDER — ACETAMINOPHEN 500 MG PO TABS
500.0000 mg | ORAL_TABLET | Freq: Four times a day (QID) | ORAL | Status: DC
  Administered 2021-12-24: 500 mg via ORAL
  Filled 2021-12-24: qty 1

## 2021-12-24 MED ORDER — METHOCARBAMOL 500 MG PO TABS
500.0000 mg | ORAL_TABLET | Freq: Four times a day (QID) | ORAL | Status: DC | PRN
  Administered 2021-12-24 – 2021-12-26 (×5): 500 mg via ORAL
  Filled 2021-12-24 (×5): qty 1

## 2021-12-24 MED ORDER — METHOCARBAMOL 1000 MG/10ML IJ SOLN
500.0000 mg | Freq: Four times a day (QID) | INTRAVENOUS | Status: DC | PRN
  Filled 2021-12-24: qty 5

## 2021-12-24 MED ORDER — 0.9 % SODIUM CHLORIDE (POUR BTL) OPTIME
TOPICAL | Status: DC | PRN
  Administered 2021-12-24 (×2): 1000 mL

## 2021-12-24 MED ORDER — ACETAMINOPHEN 160 MG/5ML PO SOLN: 325.0000 mg | ORAL | Status: DC | PRN

## 2021-12-24 MED ORDER — BUPIVACAINE-EPINEPHRINE (PF) 0.5% -1:200000 IJ SOLN
INTRAMUSCULAR | Status: DC | PRN
  Administered 2021-12-24: 15 mL via PERINEURAL

## 2021-12-24 MED ORDER — ASPIRIN EC 81 MG PO TBEC
81.0000 mg | DELAYED_RELEASE_TABLET | Freq: Every day | ORAL | Status: DC
Start: 2021-12-25 — End: 2021-12-27
  Administered 2021-12-25 – 2021-12-27 (×3): 81 mg via ORAL
  Filled 2021-12-24 (×5): qty 1

## 2021-12-24 MED ORDER — OXYCODONE HCL 5 MG PO TABS: 5.0000 mg | ORAL_TABLET | Freq: Once | ORAL | Status: DC | PRN

## 2021-12-24 MED ORDER — PROPOFOL 10 MG/ML IV BOLUS
INTRAVENOUS | Status: AC
  Filled 2021-12-24: qty 20

## 2021-12-24 MED ORDER — ONDANSETRON HCL 4 MG/2ML IJ SOLN: 4.0000 mg | Freq: Once | INTRAMUSCULAR | Status: DC | PRN

## 2021-12-24 MED ORDER — TRAZODONE HCL 50 MG PO TABS
150.0000 mg | ORAL_TABLET | Freq: Every day | ORAL | Status: DC
  Administered 2021-12-24 – 2021-12-26 (×3): 150 mg via ORAL
  Filled 2021-12-24 (×3): qty 3

## 2021-12-24 MED ORDER — DOCUSATE SODIUM 100 MG PO CAPS
100.0000 mg | ORAL_CAPSULE | Freq: Two times a day (BID) | ORAL | Status: DC
  Administered 2021-12-24 – 2021-12-27 (×6): 100 mg via ORAL
  Filled 2021-12-24 (×6): qty 1

## 2021-12-24 MED ORDER — ACETAMINOPHEN 325 MG PO TABS: 325.0000 mg | ORAL_TABLET | ORAL | Status: DC | PRN

## 2021-12-24 MED ORDER — OXYCODONE HCL 5 MG/5ML PO SOLN: 5.0000 mg | Freq: Once | ORAL | Status: DC | PRN

## 2021-12-24 MED ORDER — PHENYLEPHRINE 40 MCG/ML (10ML) SYRINGE FOR IV PUSH (FOR BLOOD PRESSURE SUPPORT)
PREFILLED_SYRINGE | INTRAVENOUS | Status: DC | PRN
  Administered 2021-12-24: 40 ug via INTRAVENOUS
  Administered 2021-12-24: 80 ug via INTRAVENOUS
  Administered 2021-12-24: 40 ug via INTRAVENOUS
  Administered 2021-12-24 (×2): 80 ug via INTRAVENOUS

## 2021-12-24 MED ORDER — ONDANSETRON HCL 4 MG/2ML IJ SOLN
INTRAMUSCULAR | Status: AC
  Filled 2021-12-24: qty 2

## 2021-12-24 MED ORDER — MIDAZOLAM HCL 2 MG/2ML IJ SOLN
INTRAMUSCULAR | Status: AC
  Filled 2021-12-24: qty 2

## 2021-12-24 MED ORDER — FENTANYL CITRATE (PF) 250 MCG/5ML IJ SOLN
INTRAMUSCULAR | Status: AC
  Filled 2021-12-24: qty 5

## 2021-12-24 MED ORDER — FENTANYL CITRATE (PF) 100 MCG/2ML IJ SOLN
INTRAMUSCULAR | Status: AC
  Filled 2021-12-24: qty 2

## 2021-12-24 MED ORDER — DEXAMETHASONE SODIUM PHOSPHATE 10 MG/ML IJ SOLN
INTRAMUSCULAR | Status: DC | PRN
  Administered 2021-12-24: 10 mg via INTRAVENOUS

## 2021-12-24 MED ORDER — DEXMEDETOMIDINE (PRECEDEX) IN NS 20 MCG/5ML (4 MCG/ML) IV SYRINGE
PREFILLED_SYRINGE | INTRAVENOUS | Status: DC | PRN
  Administered 2021-12-24: 8 ug via INTRAVENOUS
  Administered 2021-12-24: 4 ug via INTRAVENOUS

## 2021-12-24 MED ORDER — LACTATED RINGERS IV BOLUS
1000.0000 mL | Freq: Once | INTRAVENOUS | Status: AC
  Administered 2021-12-24: 1000 mL via INTRAVENOUS

## 2021-12-24 MED ORDER — CEFAZOLIN SODIUM-DEXTROSE 2-4 GM/100ML-% IV SOLN
2.0000 g | Freq: Once | INTRAVENOUS | Status: AC
  Administered 2021-12-24: 2 g via INTRAVENOUS

## 2021-12-24 MED ORDER — HYDRALAZINE HCL 10 MG PO TABS: 10.0000 mg | ORAL_TABLET | Freq: Four times a day (QID) | ORAL | Status: DC | PRN

## 2021-12-24 MED ORDER — ONDANSETRON HCL 4 MG PO TABS
4.0000 mg | ORAL_TABLET | Freq: Four times a day (QID) | ORAL | Status: DC | PRN
  Filled 2021-12-24: qty 1

## 2021-12-24 MED ORDER — ONDANSETRON HCL 4 MG/2ML IJ SOLN
4.0000 mg | Freq: Four times a day (QID) | INTRAMUSCULAR | Status: DC | PRN
  Administered 2021-12-24 (×2): 4 mg via INTRAVENOUS
  Filled 2021-12-24 (×2): qty 2

## 2021-12-24 MED ORDER — FENTANYL CITRATE (PF) 250 MCG/5ML IJ SOLN
INTRAMUSCULAR | Status: DC | PRN
  Administered 2021-12-24 (×5): 50 ug via INTRAVENOUS

## 2021-12-24 MED ORDER — PANTOPRAZOLE SODIUM 40 MG PO TBEC
40.0000 mg | DELAYED_RELEASE_TABLET | Freq: Every day | ORAL | Status: DC
Start: 2021-12-24 — End: 2021-12-27
  Administered 2021-12-24 – 2021-12-26 (×3): 40 mg via ORAL
  Filled 2021-12-24 (×3): qty 1

## 2021-12-24 MED ORDER — VANCOMYCIN HCL 1000 MG IV SOLR
INTRAVENOUS | Status: AC
  Filled 2021-12-24: qty 20

## 2021-12-24 MED ORDER — HYDROMORPHONE HCL 1 MG/ML IJ SOLN
0.5000 mg | INTRAMUSCULAR | Status: DC | PRN
  Administered 2021-12-24 (×3): 0.5 mg via INTRAVENOUS
  Filled 2021-12-24 (×3): qty 1

## 2021-12-24 MED ORDER — EPHEDRINE 5 MG/ML INJ
INTRAVENOUS | Status: AC
  Filled 2021-12-24: qty 5

## 2021-12-24 MED ORDER — HYDROMORPHONE HCL 1 MG/ML IJ SOLN
0.5000 mg | INTRAMUSCULAR | Status: DC | PRN
  Administered 2021-12-24 – 2021-12-27 (×17): 1 mg via INTRAVENOUS
  Filled 2021-12-24 (×18): qty 1

## 2021-12-24 MED ORDER — LIDOCAINE 2% (20 MG/ML) 5 ML SYRINGE
INTRAMUSCULAR | Status: DC | PRN
  Administered 2021-12-24: 40 mg via INTRAVENOUS

## 2021-12-24 MED ORDER — ACETAMINOPHEN 500 MG PO TABS
1000.0000 mg | ORAL_TABLET | Freq: Four times a day (QID) | ORAL | Status: DC
  Administered 2021-12-24 – 2021-12-25 (×3): 1000 mg via ORAL
  Filled 2021-12-24 (×3): qty 2

## 2021-12-24 MED ORDER — METOCLOPRAMIDE HCL 5 MG PO TABS: 5.0000 mg | ORAL_TABLET | Freq: Three times a day (TID) | ORAL | Status: DC | PRN

## 2021-12-24 MED ORDER — CEFAZOLIN SODIUM-DEXTROSE 2-3 GM-%(50ML) IV SOLR
INTRAVENOUS | Status: DC | PRN
  Administered 2021-12-24: 2 g via INTRAVENOUS

## 2021-12-24 MED ORDER — SODIUM CHLORIDE 0.9 % IV SOLN: INTRAVENOUS | Status: DC

## 2021-12-24 MED ORDER — CEFAZOLIN SODIUM-DEXTROSE 2-4 GM/100ML-% IV SOLN: 2.0000 g | INTRAVENOUS | Status: DC

## 2021-12-24 MED ORDER — LACTATED RINGERS IV SOLN: INTRAVENOUS | Status: DC

## 2021-12-24 MED ORDER — HYDROMORPHONE HCL 1 MG/ML IJ SOLN: 1.0000 mg | INTRAMUSCULAR | Status: DC | PRN

## 2021-12-24 MED ORDER — ORAL CARE MOUTH RINSE: 15.0000 mL | Freq: Once | OROMUCOSAL | Status: AC

## 2021-12-24 MED ORDER — POVIDONE-IODINE 10 % EX SWAB: 2.0000 "application " | Freq: Once | CUTANEOUS | Status: DC

## 2021-12-24 MED ORDER — MEPERIDINE HCL 25 MG/ML IJ SOLN: 6.2500 mg | INTRAMUSCULAR | Status: DC | PRN

## 2021-12-24 MED ORDER — FENTANYL CITRATE PF 50 MCG/ML IJ SOSY
50.0000 ug | PREFILLED_SYRINGE | INTRAMUSCULAR | Status: DC | PRN
  Administered 2021-12-24: 50 ug via INTRAVENOUS
  Filled 2021-12-24: qty 1

## 2021-12-24 MED ORDER — DEXAMETHASONE SODIUM PHOSPHATE 10 MG/ML IJ SOLN
INTRAMUSCULAR | Status: AC
  Filled 2021-12-24: qty 1

## 2021-12-24 MED ORDER — KETAMINE HCL 50 MG/5ML IJ SOSY
PREFILLED_SYRINGE | INTRAMUSCULAR | Status: AC
  Filled 2021-12-24: qty 5

## 2021-12-24 MED ORDER — ACETAMINOPHEN 325 MG PO TABS: 325.0000 mg | ORAL_TABLET | Freq: Four times a day (QID) | ORAL | Status: DC | PRN

## 2021-12-24 MED ORDER — FENTANYL CITRATE (PF) 100 MCG/2ML IJ SOLN: 100.0000 ug | Freq: Once | INTRAMUSCULAR | Status: AC

## 2021-12-24 MED ORDER — POLYETHYLENE GLYCOL 3350 17 G PO PACK: 17.0000 g | PACK | Freq: Every day | ORAL | Status: DC | PRN

## 2021-12-24 MED ORDER — ENOXAPARIN SODIUM 40 MG/0.4ML IJ SOSY
40.0000 mg | PREFILLED_SYRINGE | INTRAMUSCULAR | Status: DC
  Administered 2021-12-25 – 2021-12-26 (×2): 40 mg via SUBCUTANEOUS
  Filled 2021-12-24 (×2): qty 0.4

## 2021-12-24 MED ORDER — FENTANYL CITRATE (PF) 100 MCG/2ML IJ SOLN
25.0000 ug | INTRAMUSCULAR | Status: DC | PRN
  Administered 2021-12-24 (×2): 50 ug via INTRAVENOUS

## 2021-12-24 MED ORDER — CEFAZOLIN SODIUM-DEXTROSE 2-4 GM/100ML-% IV SOLN
2.0000 g | Freq: Three times a day (TID) | INTRAVENOUS | Status: AC
  Administered 2021-12-24 – 2021-12-25 (×3): 2 g via INTRAVENOUS
  Filled 2021-12-24 (×3): qty 100

## 2021-12-24 MED ORDER — CHLORHEXIDINE GLUCONATE 4 % EX LIQD
60.0000 mL | Freq: Once | CUTANEOUS | Status: DC
Start: 2021-12-24 — End: 2021-12-24

## 2021-12-24 MED ORDER — CHLORHEXIDINE GLUCONATE 0.12 % MT SOLN
OROMUCOSAL | Status: AC
  Administered 2021-12-24: 15 mL via OROMUCOSAL
  Filled 2021-12-24: qty 15

## 2021-12-24 MED ORDER — CEFAZOLIN SODIUM-DEXTROSE 2-4 GM/100ML-% IV SOLN: 2.0000 g | Freq: Three times a day (TID) | INTRAVENOUS | Status: DC

## 2021-12-24 MED ORDER — CHLORHEXIDINE GLUCONATE 0.12 % MT SOLN: 15.0000 mL | Freq: Once | OROMUCOSAL | Status: AC

## 2021-12-24 MED ORDER — HYDROMORPHONE HCL 1 MG/ML IJ SOLN: 0.5000 mg | INTRAMUSCULAR | Status: DC | PRN

## 2021-12-24 MED ORDER — PROPOFOL 10 MG/ML IV BOLUS
INTRAVENOUS | Status: DC | PRN
  Administered 2021-12-24: 150 mg via INTRAVENOUS

## 2021-12-24 MED ORDER — LIDOCAINE 2% (20 MG/ML) 5 ML SYRINGE
INTRAMUSCULAR | Status: AC
  Filled 2021-12-24: qty 10

## 2021-12-24 MED ORDER — ONDANSETRON HCL 4 MG PO TABS
4.0000 mg | ORAL_TABLET | Freq: Four times a day (QID) | ORAL | Status: DC | PRN
  Administered 2021-12-25 – 2021-12-26 (×3): 4 mg via ORAL
  Filled 2021-12-24 (×3): qty 1

## 2021-12-24 MED ORDER — ROCURONIUM BROMIDE 10 MG/ML (PF) SYRINGE
PREFILLED_SYRINGE | INTRAVENOUS | Status: DC | PRN
  Administered 2021-12-24: 50 mg via INTRAVENOUS
  Administered 2021-12-24 (×2): 10 mg via INTRAVENOUS

## 2021-12-24 MED ORDER — OXYCODONE HCL 5 MG PO TABS
5.0000 mg | ORAL_TABLET | ORAL | Status: DC | PRN
  Administered 2021-12-24: 5 mg via ORAL
  Filled 2021-12-24: qty 1

## 2021-12-24 MED ORDER — ROPINIROLE HCL 0.5 MG PO TABS
0.5000 mg | ORAL_TABLET | Freq: Every day | ORAL | Status: DC
  Administered 2021-12-24 – 2021-12-26 (×3): 0.5 mg via ORAL
  Filled 2021-12-24 (×3): qty 1

## 2021-12-24 MED ORDER — MORPHINE SULFATE (PF) 2 MG/ML IV SOLN: 0.5000 mg | INTRAVENOUS | Status: DC | PRN

## 2021-12-24 MED ORDER — BUPIVACAINE LIPOSOME 1.3 % IJ SUSP
INTRAMUSCULAR | Status: DC | PRN
  Administered 2021-12-24: 10 mL via PERINEURAL

## 2021-12-24 MED ORDER — FENTANYL CITRATE PF 50 MCG/ML IJ SOSY
75.0000 ug | PREFILLED_SYRINGE | Freq: Once | INTRAMUSCULAR | Status: AC
  Administered 2021-12-24: 75 ug via INTRAVENOUS
  Filled 2021-12-24: qty 2

## 2021-12-24 MED ORDER — OXYCODONE HCL 5 MG PO TABS
5.0000 mg | ORAL_TABLET | ORAL | Status: DC | PRN
  Administered 2021-12-24: 10 mg via ORAL
  Administered 2021-12-25: 5 mg via ORAL
  Administered 2021-12-25 – 2021-12-27 (×12): 10 mg via ORAL
  Filled 2021-12-24 (×4): qty 2
  Filled 2021-12-24: qty 1
  Filled 2021-12-24 (×9): qty 2

## 2021-12-24 MED ORDER — MIDAZOLAM HCL 2 MG/2ML IJ SOLN
INTRAMUSCULAR | Status: AC
  Administered 2021-12-24: 2 mg via INTRAVENOUS
  Filled 2021-12-24: qty 2

## 2021-12-24 MED ORDER — ROCURONIUM BROMIDE 10 MG/ML (PF) SYRINGE
PREFILLED_SYRINGE | INTRAVENOUS | Status: AC
  Filled 2021-12-24: qty 20

## 2021-12-24 MED ORDER — FENTANYL CITRATE (PF) 100 MCG/2ML IJ SOLN
INTRAMUSCULAR | Status: AC
  Administered 2021-12-24: 100 ug via INTRAVENOUS
  Filled 2021-12-24: qty 2

## 2021-12-24 MED ORDER — METOCLOPRAMIDE HCL 5 MG/ML IJ SOLN: 5.0000 mg | Freq: Three times a day (TID) | INTRAMUSCULAR | Status: DC | PRN

## 2021-12-24 SURGICAL SUPPLY — 79 items
APL PRP STRL LF DISP 70% ISPRP (MISCELLANEOUS) ×1
APL SKNCLS STERI-STRIP NONHPOA (GAUZE/BANDAGES/DRESSINGS) ×1
BAG COUNTER SPONGE SURGICOUNT (BAG) ×2 IMPLANT
BAG SPNG CNTER NS LX DISP (BAG) ×1
BENZOIN TINCTURE PRP APPL 2/3 (GAUZE/BANDAGES/DRESSINGS) ×1 IMPLANT
BIT DRILL CALIBRATED 4.2 (BIT) IMPLANT
BIT DRILL SHORT 4.2 (BIT) IMPLANT
BLADE SURG 10 STRL SS (BLADE) ×4 IMPLANT
BNDG COHESIVE 4X5 TAN STRL (GAUZE/BANDAGES/DRESSINGS) ×1 IMPLANT
BNDG ELASTIC 4X5.8 VLCR STR LF (GAUZE/BANDAGES/DRESSINGS) ×2 IMPLANT
BNDG ELASTIC 6X5.8 VLCR STR LF (GAUZE/BANDAGES/DRESSINGS) ×2 IMPLANT
BNDG GAUZE ELAST 4 BULKY (GAUZE/BANDAGES/DRESSINGS) ×2 IMPLANT
BOOT STEPPER DURA MED (SOFTGOODS) ×1 IMPLANT
BRUSH SCRUB EZ PLAIN DRY (MISCELLANEOUS) ×4 IMPLANT
CHLORAPREP W/TINT 26 (MISCELLANEOUS) ×2 IMPLANT
COVER MAYO STAND STRL (DRAPES) ×2 IMPLANT
COVER SURGICAL LIGHT HANDLE (MISCELLANEOUS) ×4 IMPLANT
DRAPE C-ARM 42X72 X-RAY (DRAPES) ×2 IMPLANT
DRAPE C-ARMOR (DRAPES) ×2 IMPLANT
DRAPE HALF SHEET 40X57 (DRAPES) ×4 IMPLANT
DRAPE IMP U-DRAPE 54X76 (DRAPES) ×4 IMPLANT
DRAPE INCISE IOBAN 66X45 STRL (DRAPES) IMPLANT
DRAPE ORTHO SPLIT 77X108 STRL (DRAPES) ×4
DRAPE SURG 17X23 STRL (DRAPES) ×2 IMPLANT
DRAPE SURG ORHT 6 SPLT 77X108 (DRAPES) ×2 IMPLANT
DRAPE U-SHAPE 47X51 STRL (DRAPES) ×2 IMPLANT
DRESSING MEPILEX FLEX 4X4 (GAUZE/BANDAGES/DRESSINGS) IMPLANT
DRILL BIT CALIBRATED 4.2 (BIT) ×2
DRILL BIT SHORT 4.2 (BIT) ×4
DRSG ADAPTIC 3X8 NADH LF (GAUZE/BANDAGES/DRESSINGS) ×1 IMPLANT
DRSG MEPILEX FLEX 4X4 (GAUZE/BANDAGES/DRESSINGS) ×2
ELECT REM PT RETURN 9FT ADLT (ELECTROSURGICAL) ×2
ELECTRODE REM PT RTRN 9FT ADLT (ELECTROSURGICAL) ×1 IMPLANT
EVACUATOR 1/8 PVC DRAIN (DRAIN) IMPLANT
GAUZE SPONGE 4X4 12PLY STRL (GAUZE/BANDAGES/DRESSINGS) ×3 IMPLANT
GLOVE SURG ENC MOIS LTX SZ6.5 (GLOVE) ×6 IMPLANT
GLOVE SURG ENC MOIS LTX SZ7.5 (GLOVE) ×8 IMPLANT
GLOVE SURG UNDER POLY LF SZ6.5 (GLOVE) ×2 IMPLANT
GLOVE SURG UNDER POLY LF SZ7.5 (GLOVE) ×2 IMPLANT
GOWN STRL REUS W/ TWL LRG LVL3 (GOWN DISPOSABLE) ×2 IMPLANT
GOWN STRL REUS W/TWL LRG LVL3 (GOWN DISPOSABLE) ×4
GUIDEWIRE 3.2X400 (WIRE) ×1 IMPLANT
HANDPIECE INTERPULSE COAX TIP (DISPOSABLE)
KIT BASIN OR (CUSTOM PROCEDURE TRAY) ×2 IMPLANT
KIT TURNOVER KIT B (KITS) ×2 IMPLANT
MANIFOLD NEPTUNE II (INSTRUMENTS) ×2 IMPLANT
NAIL TIB TFNA 9X330 (Nail) ×1 IMPLANT
NS IRRIG 1000ML POUR BTL (IV SOLUTION) ×2 IMPLANT
PACK ORTHO EXTREMITY (CUSTOM PROCEDURE TRAY) ×2 IMPLANT
PACK TOTAL JOINT (CUSTOM PROCEDURE TRAY) ×2 IMPLANT
PAD ARMBOARD 7.5X6 YLW CONV (MISCELLANEOUS) ×4 IMPLANT
PAD CAST 4YDX4 CTTN HI CHSV (CAST SUPPLIES) IMPLANT
PADDING CAST COTTON 4X4 STRL (CAST SUPPLIES) ×2
PADDING CAST COTTON 6X4 STRL (CAST SUPPLIES) ×2 IMPLANT
REAMER ROD 3.8 BALL TIP 3X950 (ORTHOPEDIC DISPOSABLE SUPPLIES) ×2 IMPLANT
SCREW LOCK HDLS 5X32 (Screw) ×2 IMPLANT
SCREW LOCK HDLS 5X34 (Screw) ×2 IMPLANT
SCREW LOCK HDLS 5X58 (Screw) ×1 IMPLANT
SCREW LOCK THRD HDLS 5X66 (Screw) ×1 IMPLANT
SET HNDPC FAN SPRY TIP SCT (DISPOSABLE) IMPLANT
SPONGE T-LAP 18X18 ~~LOC~~+RFID (SPONGE) ×2 IMPLANT
STAPLER VISISTAT 35W (STAPLE) ×1 IMPLANT
STRIP CLOSURE SKIN 1/2X4 (GAUZE/BANDAGES/DRESSINGS) ×2 IMPLANT
SUT ETHILON 2 0 FS 18 (SUTURE) ×2 IMPLANT
SUT ETHILON 3 0 PS 1 (SUTURE) ×2 IMPLANT
SUT MNCRL AB 3-0 PS2 18 (SUTURE) ×2 IMPLANT
SUT MON AB 2-0 CT1 36 (SUTURE) ×1 IMPLANT
SUT PDS AB 0 CT 36 (SUTURE) IMPLANT
SUT VIC AB 0 CT1 27 (SUTURE) ×2
SUT VIC AB 0 CT1 27XBRD ANBCTR (SUTURE) IMPLANT
SUT VIC AB 2-0 CT1 27 (SUTURE) ×2
SUT VIC AB 2-0 CT1 TAPERPNT 27 (SUTURE) IMPLANT
SWAB CULTURE ESWAB REG 1ML (MISCELLANEOUS) IMPLANT
TOWEL GREEN STERILE (TOWEL DISPOSABLE) ×4 IMPLANT
TOWEL GREEN STERILE FF (TOWEL DISPOSABLE) ×2 IMPLANT
TUBE CONNECTING 12X1/4 (SUCTIONS) ×2 IMPLANT
UNDERPAD 30X36 HEAVY ABSORB (UNDERPADS AND DIAPERS) ×2 IMPLANT
WATER STERILE IRR 1000ML POUR (IV SOLUTION) ×2 IMPLANT
YANKAUER SUCT BULB TIP NO VENT (SUCTIONS) ×2 IMPLANT

## 2021-12-24 NOTE — Anesthesia Procedure Notes (Signed)
Procedure Name: Intubation Date/Time: 12/24/2021 12:13 PM Performed by: Gaylene Brooks, CRNA Pre-anesthesia Checklist: Patient identified, Emergency Drugs available, Suction available and Patient being monitored Patient Re-evaluated:Patient Re-evaluated prior to induction Oxygen Delivery Method: Circle System Utilized Preoxygenation: Pre-oxygenation with 100% oxygen Induction Type: IV induction Ventilation: Mask ventilation without difficulty Laryngoscope Size: Miller and 2 Grade View: Grade I Tube type: Oral Tube size: 7.0 mm Number of attempts: 1 Airway Equipment and Method: Stylet and Oral airway Placement Confirmation: ETT inserted through vocal cords under direct vision, positive ETCO2 and breath sounds checked- equal and bilateral Secured at: 22 cm Tube secured with: Tape Dental Injury: Teeth and Oropharynx as per pre-operative assessment

## 2021-12-24 NOTE — Anesthesia Procedure Notes (Addendum)
Anesthesia Regional Block: Adductor canal block   Pre-Anesthetic Checklist: , timeout performed,  Correct Patient, Correct Site, Correct Laterality,  Correct Procedure, Correct Position, site marked,  Risks and benefits discussed,  Surgical consent,  Pre-op evaluation,  At surgeon's request and post-op pain management  Laterality: Left  Prep: chloraprep       Needles:  Injection technique: Single-shot  Needle Type: Echogenic Stimulator Needle     Needle Length: 5cm  Needle Gauge: 22     Additional Needles:   Procedures:, nerve stimulator,,, ultrasound used (permanent image in chart),,    Narrative:  Start time: 12/24/2021 11:30 AM End time: 12/24/2021 11:35 AM Injection made incrementally with aspirations every 5 mL.  Performed by: Personally  Anesthesiologist: Janeece Riggers, MD  Additional Notes: Functioning IV was confirmed and monitors were applied.  A 19mm 22ga Arrow echogenic stimulator needle was used. Sterile prep and drape,hand hygiene and sterile gloves were used. Ultrasound guidance: relevant anatomy identified, needle position confirmed, local anesthetic spread visualized around nerve(s)., vascular puncture avoided.  Image printed for medical record. Negative aspiration and negative test dose prior to incremental administration of local anesthetic. The patient tolerated the procedure well.

## 2021-12-24 NOTE — Anesthesia Preprocedure Evaluation (Signed)
Anesthesia Evaluation  Patient identified by MRN, date of birth, ID band Patient awake    Reviewed: Allergy & Precautions, H&P , NPO status , Patient's Chart, lab work & pertinent test results  History of Anesthesia Complications Negative for: history of anesthetic complications  Airway Mallampati: II  TM Distance: >3 FB Neck ROM: Full    Dental no notable dental hx. (+) Dental Advisory Given   Pulmonary Current Smoker and Patient abstained from smoking., former smoker,    Pulmonary exam normal        Cardiovascular negative cardio ROS Normal cardiovascular exam     Neuro/Psych PSYCHIATRIC DISORDERS Anxiety Depression negative neurological ROS     GI/Hepatic negative GI ROS, (+)     substance abuse  alcohol use,   Endo/Other  negative endocrine ROS  Renal/GU negative Renal ROS  negative genitourinary   Musculoskeletal  (+) Arthritis , Osteoarthritis,  Fibromyalgia -, narcotic dependent  Abdominal   Peds  Hematology  (+) Blood dyscrasia, anemia ,   Anesthesia Other Findings   Reproductive/Obstetrics negative OB ROS                             Anesthesia Physical  Anesthesia Plan  ASA: 3  Anesthesia Plan: General   Post-op Pain Management:    Induction: Intravenous  PONV Risk Score and Plan: 3 and Ondansetron, Treatment may vary due to age or medical condition, Dexamethasone and Midazolam  Airway Management Planned: Oral ETT and LMA  Additional Equipment: None  Intra-op Plan:   Post-operative Plan: Extubation in OR  Informed Consent: I have reviewed the patients History and Physical, chart, labs and discussed the procedure including the risks, benefits and alternatives for the proposed anesthesia with the patient or authorized representative who has indicated his/her understanding and acceptance.     Dental advisory given  Plan Discussed with: Anesthesiologist and  CRNA  Anesthesia Plan Comments: ( )        Anesthesia Quick Evaluation

## 2021-12-24 NOTE — ED Notes (Signed)
Trauma Response Nurse Documentation   [name] is a 60 y.o. female arriving to [facility] ED via EMS  On No antithrombotic. Trauma was activated as a Level 2 by ED charge RN, upgrade to a level 1 by TRN at 2347 after arrival for hypotension 80/54 based on the following trauma criteria Grossly contaminated open fractures. Trauma team at the bedside on patient arrival. GCS 15.  History   Past Medical History:  Diagnosis Date   Alcoholic pancreatitis 62/8315   01/15/13   Anemia    Reports being borderline   Anxiety    Chronic lower back pain    Chronic pain    treated at [place]    Complex tear of lateral meniscus of right knee 09/18/2018   Depression    Fibromyalgia    History of blood transfusion    "related to ORs"   Migraine    "monthly" (03/25/2015)   Osteoarthritis    "wrists, knees, back, neck, fingers,  q joint" (03/25/2015)   Primary localized osteoarthritis of right knee      Past Surgical History:  Procedure Laterality Date   ANTERIOR CERVICAL DECOMP/DISCECTOMY FUSION     2006   BACK SURGERY  2014   CARPAL TUNNEL RELEASE Right 2010   CYSTECTOMY Right 1980's   hand   CYSTECTOMY     "off my mid to lower back"   ENDOMETRIAL ABLATION  ~ 2005   KNEE ARTHROSCOPY WITH MEDIAL MENISECTOMY Right 09/19/2018   Procedure: KNEE ARTHROSCOPY WITH LATERAL MENISECTOMY AND MEDIAL MENISECTOMY, CHONDROPLASTY;  Surgeon: Elsie Saas, MD;  Location: [place];  Service: Orthopedics;  Laterality: Right;   KNEE SURGERY Left 2007   total knee   KNEE SURGERY     "I've had ~ 4 ORs on this knee in the last 20 yrs" (03/25/2015)   LAPAROSCOPIC CHOLECYSTECTOMY  2011   OSTEOTOMY AND ULNAR SHORTENING Left ~ 2010 X 2   POSTERIOR FUSION CERVICAL SPINE  2008   TONSILLECTOMY  1960's   TOTAL HIP ARTHROPLASTY Left 01/07/2015   Procedure: TOTAL HIP ARTHROPLASTY ANTERIOR APPROACH;  Surgeon: Renette Butters, MD;  Location: [place];  Service: Orthopedics;  Laterality:  Left;   TOTAL HIP ARTHROPLASTY Right 03/25/2015   Procedure: RIGHT TOTAL HIP ARTHROPLASTY ANTERIOR APPROACH;  Surgeon: Renette Butters, MD;  Location: [place];  Service: Orthopedics;  Laterality: Right;   TOTAL KNEE ARTHROPLASTY Right 09/21/2021   Procedure: TOTAL KNEE ARTHROPLASTY;  Surgeon: Willaim Sheng, MD;  Location[facility];  Service: Orthopedics;  Laterality: Right;       Initial Focused Assessment (If applicable, or please see trauma documentation): Open fracture to left tib/fib, puncture type wound to anterior mid lower leg, palpable pulses with >3 second cap refill.  CT's Completed:   none at this time per EDP Bero - anticipate CT LLE to surgical eval  Interventions:  IV start and trauma lab draw Warm NS fluid bolus Portable XRAY left femur and tib/fib, no chest and pelvis needed per EDP [place] Ancef IV Fentanyl IV push for pain management COVID swab Splint left lower leg by ortho tech TDAP not indicated - UTD per patient  Plan for disposition:  Admit   Consults completed:  Orthopaedic Surgeon paged at [place] (ordered at [place]) [place] Trauma surg MD arrived at bedside at 2357  Event Summary: Patient arrived via EMS from home after a ground level fall, reports doing the dishes and suddenly fell. Open fx to left tib/fib,  puncture type wound to mid anterior lower leg. EMS c-collar in place, tourniquet to left leg. EMS reports initial pressure 66/30, improved with NS bolus 250ML. Upon arrival, initial manual BP 80/54, upgraded to trauma level 1 at 2347, one liter NS bolus given on pressure bag with improvement. Tourniquet removed without bleeding to wound site. Pulses palpable, cap refill greater than 3 seconds, toes dusky. Dressing applied by EDP. Cspine cleared by Dr. Sedonia Small shortly after arrival. Portable xray with spiral type fracture to LLE. Dr. Zenia Resides trauma surgery arrived at bedside 2357, downgraded to level 2 trauma after her assessment. BP continues to improve with IV  fluid bolus. TRN assisted with posterior short leg splint to LLE. Pending consult to orthopedics, anticipate admission with surgical intervention.  MTP Summary (If applicable): NA  Bedside handoff with ED RN Shirlee Limerick.    Sharalyn Lomba O Abbie Berling  Trauma Response RN  Please call TRN at [phone] for further assistance.

## 2021-12-24 NOTE — Interval H&P Note (Signed)
History and Physical Interval Note:  12/24/2021 11:15 AM  [name]  has presented today for surgery, with the diagnosis of Left tibia fracture.  The various methods of treatment have been discussed with the patient and family. After consideration of risks, benefits and other options for treatment, the patient has consented to  Procedure(s): INTRAMEDULLARY (IM) NAIL TIBIAL (Left) IRRIGATION AND DEBRIDEMENT EXTREMITY (Left) as a surgical intervention.  The patient's history has been reviewed, patient examined, no change in status, stable for surgery.  I have reviewed the patient's chart and labs.  Questions were answered to the patient's satisfaction.     Lennette Bihari P Jeselle Hiser

## 2021-12-24 NOTE — TOC CAGE-AID Note (Signed)
Transition of Care [facility]) - CAGE-AID Screening   Patient Details  Name: [name] MRN: [id number] Date of Birth: 08/04/1961  Transition of Care [facility]) CM/SW Contact:    Army Melia, RN Phone [phone] 12/24/2021, 1:16 AM   Clinical Narrative: Patient presents to hospital after a ground level fall resulting in  left tib/fib fx. Reports "a couple" beers daily, reports no current drug use, hx marijuana per chart review.    CAGE-AID Screening:    Have You Ever Felt You Ought to Cut Down on Your Drinking or Drug Use?: Yes Have People Annoyed You By Critizing Your Drinking Or Drug Use?: No Have You Felt Bad Or Guilty About Your Drinking Or Drug Use?: No Have You Ever Had a Drink or Used Drugs First Thing In The Morning to Steady Your Nerves or to Get Rid of a Hangover?: No CAGE-AID Score: 1  Substance Abuse Education Offered: Yes  Substance abuse interventions: Patient Counseling

## 2021-12-24 NOTE — Progress Notes (Signed)
°   12/23/21 2330  Clinical Encounter Type  Visited With Patient not available;Health care provider  Visit Type ED;Trauma;Initial  Referral From Nurse  Consult/Referral To Chaplain   Responded to M.C.E.D. Blue 34 for Level 2. Patient not seen by Chaplain as being evaluated and treated by Medical staff at this time. No family present at this time. Staff will page Chaplain upon request of patient or family. [place], M.Min., [phone]

## 2021-12-24 NOTE — Care Plan (Signed)
ORTHOPAEDIC SURGERY Plan of Care  -called by ED regarding new consult for this patient for minimally displaced comminuted fractures of the left tibia and fibula shafts -per ED, there is a pokehole wound over the anterior leg and otherwise pulses intact distally with good capillary refill -review of left leg radiographs shows non-displaced fractures as described above, intact total knee prosthesis -she has been placed in a left leg splint by ED and IV ancef initiated due to to the pokehole wound -pt otherwise healthy with resolved hypotension on presentation -ED to obtain CT of the left leg including knee and ankle for potential preoperative planning to rule out intra-articular involvement -pt to be NPO from now for surgical evaluation in morning, hold VTE chemoprophylaxis -full consult note to follow  Armond Hang, MD Orthopaedic Surgery [facility]

## 2021-12-24 NOTE — ED Provider Notes (Signed)
[facility] Emergency Department Provider Note MRN:  [id number]  Arrival date & time: 12/24/21     Chief Complaint   Fall and Trauma   History of Present Illness   [name] is a 61 y.o. year-old female with a history of fibromyalgia presenting to the ED with chief complaint of fall and trauma.  Patient was standing at the sink and turned and then suddenly she had severe pain in her leg causing her to fall to the ground.  She denies any injuries from the fall, she is endorsing severe pain to her left leg with bleeding noted.  Tourniquet placed by EMS.  Review of Systems  A thorough review of systems was obtained and all systems are negative except as noted in the HPI and PMH.   Patient's Health History    Past Medical History:  Diagnosis Date   Alcoholic pancreatitis 76/2263   01/15/13   Anemia    Reports being borderline   Anxiety    Chronic lower back pain    Chronic pain    treated at [place]    Complex tear of lateral meniscus of right knee 09/18/2018   Depression    Fibromyalgia    History of blood transfusion    "related to ORs"   Migraine    "monthly" (03/25/2015)   Osteoarthritis    "wrists, knees, back, neck, fingers,  q joint" (03/25/2015)   Primary localized osteoarthritis of right knee     Past Surgical History:  Procedure Laterality Date   ANTERIOR CERVICAL DECOMP/DISCECTOMY FUSION     2006   BACK SURGERY  2014   CARPAL TUNNEL RELEASE Right 2010   CYSTECTOMY Right 1980's   hand   CYSTECTOMY     "off my mid to lower back"   ENDOMETRIAL ABLATION  ~ 2005   KNEE ARTHROSCOPY WITH MEDIAL MENISECTOMY Right 09/19/2018   Procedure: KNEE ARTHROSCOPY WITH LATERAL MENISECTOMY AND MEDIAL MENISECTOMY, CHONDROPLASTY;  Surgeon: Elsie Saas, MD;  Location: [place];  Service: Orthopedics;  Laterality: Right;   KNEE SURGERY Left 2007   total knee   KNEE SURGERY     "I've had ~ 4 ORs on this knee in the  last 20 yrs" (03/25/2015)   LAPAROSCOPIC CHOLECYSTECTOMY  2011   OSTEOTOMY AND ULNAR SHORTENING Left ~ 2010 X 2   POSTERIOR FUSION CERVICAL SPINE  2008   TONSILLECTOMY  1960's   TOTAL HIP ARTHROPLASTY Left 01/07/2015   Procedure: TOTAL HIP ARTHROPLASTY ANTERIOR APPROACH;  Surgeon: Renette Butters, MD;  Location: [place];  Service: Orthopedics;  Laterality: Left;   TOTAL HIP ARTHROPLASTY Right 03/25/2015   Procedure: RIGHT TOTAL HIP ARTHROPLASTY ANTERIOR APPROACH;  Surgeon: Renette Butters, MD;  Location: [place];  Service: Orthopedics;  Laterality: Right;   TOTAL KNEE ARTHROPLASTY Right 09/21/2021   Procedure: TOTAL KNEE ARTHROPLASTY;  Surgeon: Willaim Sheng, MD;  [facility];  Service: Orthopedics;  Laterality: Right;    Family History  Problem Relation Age of Onset   Cancer Mother    Cancer Sister     Social History   Socioeconomic History   Marital status: Married    Spouse name: Dimitri Dsouza    Number of children: 2   Years of education: 12+   Highest education level: Not on file  Occupational History   Occupation: Disabled  Tobacco Use   Smoking status: Former    Packs/day: 0.50    Years: 35.00  Pack years: 17.50    Types: Cigarettes    Quit date: 08/31/2021    Years since quitting: 0.3   Smokeless tobacco: Never   Tobacco comments:    no ready quit   Vaping Use   Vaping Use: Never used  Substance and Sexual Activity   Alcohol use: Yes    Alcohol/week: 0.0 standard drinks    Comment: rare   Drug use: No    Comment: occassional marijuana   Sexual activity: Yes    Partners: Male  Other Topics Concern   Not on file  Social History Narrative   Patient is single.    Patient has 2 children.    Patient is disabled.    Patient has a college education.    Social Determinants of Health   Financial Resource Strain: Not on file  Food Insecurity: Not on file  Transportation Needs: Not on file  Physical Activity: Not on file  Stress: Not on file  Social  Connections: Not on file  Intimate Partner Violence: Not on file     Physical Exam   Vitals:   12/24/21 0030 12/24/21 0035  BP: 108/60 (!) 115/59  Pulse: 77 77  Resp: 13 14  Temp:    SpO2: 98% 100%    CONSTITUTIONAL: Well-appearing, NAD NEURO/PSYCH:  Alert and oriented x 3, no focal deficits EYES:  eyes equal and reactive ENT/NECK:  no LAD, no JVD CARDIO: Regular rate, well-perfused, normal S1 and S2 PULM:  CTAB no wheezing or rhonchi GI/GU:  non-distended, non-tender MSK/SPINE:  No gross deformities, no edema SKIN: Punctate wound to the left anterior shin with oozing of blood surrounded by bony crepitus   *Additional and/or pertinent findings included in MDM below  Diagnostic and Interventional Summary    EKG Interpretation  Date/Time:    Ventricular Rate:    PR Interval:    QRS Duration:   QT Interval:    QTC Calculation:   R Axis:     Text Interpretation:         Labs Reviewed  COMPREHENSIVE METABOLIC PANEL - Abnormal; Notable for the following components:      Result Value   Sodium 127 (*)    Potassium 3.2 (*)    Chloride 92 (*)    Glucose, Bld 103 (*)    Total Protein 6.1 (*)    All other components within normal limits  CBC - Abnormal; Notable for the following components:   HCT 35.6 (*)    Platelets 130 (*)    All other components within normal limits  ETHANOL - Abnormal; Notable for the following components:   Alcohol, Ethyl (B) 110 (*)    All other components within normal limits  LACTIC ACID, PLASMA - Abnormal; Notable for the following components:   Lactic Acid, Venous 4.8 (*)    All other components within normal limits  I-STAT CHEM 8, ED - Abnormal; Notable for the following components:   Sodium 127 (*)    Potassium 3.0 (*)    Chloride 90 (*)    Creatinine, Ser 1.10 (*)    Calcium, Ion 1.09 (*)    All other components within normal limits  RESP PANEL BY RT-PCR (FLU A&B, COVID) [id number]  PROTIME-INR  URINALYSIS, ROUTINE W REFLEX  MICROSCOPIC  TYPE AND SCREEN    DG Tibia/Fibula Left Port  Final Result    DG Femur Portable 1 View Left  Final Result    CT Tibia Fibula Left Wo Contrast    (Results  Pending)    Medications  fentaNYL (SUBLIMAZE) injection 50 mcg (has no administration in time range)  lactated ringers bolus 1,000 mL (has no administration in time range)  sodium chloride 0.9 % bolus 1,000 mL (1,000 mLs Intravenous New Bag/Given 12/23/21 2346)  fentaNYL (SUBLIMAZE) injection 50 mcg ( Intravenous Not Given 12/23/21 2354)  ceFAZolin (ANCEF) IVPB 2g/100 mL premix (0 g Intravenous Stopped 12/24/21 0038)  fentaNYL (SUBLIMAZE) injection 75 mcg (75 mcg Intravenous Given 12/24/21 0015)     Procedures  /  Critical Care .Critical Care Performed by: Maudie Flakes, MD Authorized by: Maudie Flakes, MD   Critical care provider statement:    Critical care time (minutes):  45   Critical care was necessary to treat or prevent imminent or life-threatening deterioration of the following conditions:  Trauma   Critical care was time spent personally by me on the following activities:  Development of treatment plan with patient or surrogate, discussions with consultants, evaluation of patient's response to treatment, examination of patient, ordering and review of laboratory studies, ordering and review of radiographic studies, ordering and performing treatments and interventions, pulse oximetry, re-evaluation of patient's condition and review of old charts .Ortho Injury Treatment  Date/Time: 12/24/2021 12:49 AM Performed by: Maudie Flakes, MD Authorized by: Maudie Flakes, MD   Consent:    Consent obtained:  Emergent situationInjury location: lower leg Location details: left lower leg Injury type: fracture Fracture type: tibial shaft Pre-procedure distal perfusion: absent Pre-procedure distal perfusion comment: Tourniquet in place  Anesthesia: Local anesthesia used: no  Patient sedated: NoPost-procedure distal  perfusion: normal Post-procedure distal perfusion comment: Tourniquet removed Post-procedure neurological function: normal    ED Course and Medical Decision Making  Initial Impression and Ddx Isolated tib-fib injury with hypotension, report of large amount of blood loss.  On arrival patient's blood pressure 84 systolic.  Was initially level 2 trauma, briefly upgraded to level 1.  Responded well to IV fluids, was evaluated by trauma surgery at bedside, downgraded back to level 2.  She has no signs of head trauma, no spinal tenderness, bilateral breath sounds, no shortness of breath, no abdominal pain, does appear to be an isolated orthopedic injury.  Pressure dressing was applied to the small wound on the anterior shin and then tourniquet was removed.  Total tourniquet time about 30 minutes.  The pressure dressing provided hemostasis and strong pulse returned to the left dorsalis pedis.  No signs of arterial injury.  Discussed case with Dr. Kathaleen Bury of orthopedics, plan is to obtain CT imaging and admission.  Past medical/surgical history that increases complexity of ED encounter: History of knee/hip replacement on the same leg  Interpretation of Diagnostics I personally reviewed the x-rays of the extremity and my interpretation is as follows: Spiral tibia fracture with minimal displaced proximal fibular fracture    Labs reveal lactate of 4.8, providing further fluid resuscitation.  Hemodynamically doing well.  Patient Reassessment and Ultimate Disposition/Management Patient admitted to orthopedic service.  Patient management required discussion with the following services or consulting groups:  General/Trauma Surgery and Orthopedic Surgery  Complexity of Problems Addressed Acute illness or injury that poses threat of life of bodily function  Additional Data Reviewed and Analyzed Further history obtained from: EMS on arrival and Further history from spouse/family member  Factors  Impacting ED Encounter Risk Use of parenteral controlled substances and Consideration of hospitalization  Barth Kirks. Sedonia Small, [place] [email or web address]  Final Clinical Impressions(s) /  ED Diagnoses     ICD-10-CM   1. Type I or II open nondisplaced spiral fracture of shaft of left tibia, initial encounter  S82.245B     2. Trauma  T14.90XA DG Tibia/Fibula Left Port    DG Tibia/Fibula Left Port    CANCELED: DG Chest Port 1 View    CANCELED: DG Pelvis Portable    CANCELED: DG Chest Port 1 View    CANCELED: DG Pelvis Portable      ED Discharge Orders     None        Discharge Instructions Discussed with and Provided to Patient:   Discharge Instructions   None      Maudie Flakes, MD 12/24/21 [phone]

## 2021-12-24 NOTE — Plan of Care (Signed)

## 2021-12-24 NOTE — Progress Notes (Signed)
Orthopedic Tech Progress Note Patient Details:  KAMIRA MELLETTE [birth year] [id number] Level 2 trauma Ortho Devices Type of Ortho Device: Post (short leg) splint Ortho Device/Splint Location: LLE Ortho Device/Splint Interventions: Ordered, Application, Adjustment   Post Interventions Patient Tolerated: Fair Instructions Provided: Care of device, Poper ambulation with device  Evadne Ose 12/24/2021, 12:25 AM

## 2021-12-24 NOTE — Anesthesia Postprocedure Evaluation (Signed)
Anesthesia Post Note  Patient: [name]  Procedure(s) Performed: INTRAMEDULLARY (IM) NAIL TIBIAL (Left) IRRIGATION AND DEBRIDEMENT EXTREMITY (Left)     Patient location during evaluation: PACU Anesthesia Type: General Level of consciousness: awake and alert Pain management: pain level controlled Vital Signs Assessment: post-procedure vital signs reviewed and stable Respiratory status: spontaneous breathing, nonlabored ventilation, respiratory function stable and patient connected to nasal cannula oxygen Cardiovascular status: blood pressure returned to baseline and stable Postop Assessment: no apparent nausea or vomiting Anesthetic complications: no   No notable events documented.  Last Vitals:  Vitals:   12/24/21 1459 12/24/21 1517  BP: 117/67 120/77  Pulse: 74 91  Resp: 12 18  Temp: 37.2 C 36.9 C  SpO2: 99% 95%    Last Pain:  Vitals:   12/24/21 1517  TempSrc: Oral  PainSc:                  [name]

## 2021-12-24 NOTE — ED Notes (Signed)
Pt reporting numbness of left toes. Cap refill <3, pt can still move toes. Kathaleen Bury MD made aware.

## 2021-12-24 NOTE — Op Note (Signed)
Orthopaedic Surgery Operative Note (CSN: [id number] ) Date of Surgery: 12/23/2021 - 12/24/2021  Admit Date: 12/23/2021   Diagnoses: Pre-Op Diagnoses: Left periprosthetic open tibial shaft fracture  Post-Op Diagnosis: Same  Procedures: CPT 27759-Intramedullary nailing of left tibial shaft CPT 11012-Irrigation and debridement of left open tibia fracture  Surgeons : Primary: Shona Needles, MD  Assistant: Patrecia Pace, PA-C  Location: OR 3   Anesthesia:General with regional   Antibiotics: Ancef 2g preop with 1 gm vancomycin powder placed topically   Tourniquet time:* No tourniquets in log *     Estimated Blood TZGY:17 mL  Complications:None   Specimens:None   Implants: Implant Name Type Inv. Item Serial No. Manufacturer Lot No. LRB No. Used Action  NAIL TIB TFNA [id number] - [id number] Nail NAIL TIB TFNA [id number]  [employer] [id number] Left 1 Implanted  SCREW LOCK HDLS 1M38 - [id number] Screw SCREW LOCK HDLS 0V77  [employer]PU[employer] [id number] Left 1 Implanted  SCREW LOCK HDLS 9Q33 - [id number] Screw SCREW LOCK HDLS 3L45  [employer]PU[employer] [id number] Left 1 Implanted  SCREW LOCK HDLS 3T34 - [id number] Screw SCREW LOCK HDLS 2I20  DEPU[employer] [id number] Left 1 Implanted  SCREW LOCK HDLS 6L84 - [id number] Screw SCREW LOCK HDLS 1Y24  DEPU[employer] [id number] Left 1 Implanted  SCREW LOCK HDLS 0W88 - [id number] Screw SCREW LOCK HDLS [id number]  [employer] [id number] Left 1 Implanted     Indications for Surgery: 61 year old female who sustained a left open tibia fracture from a ground-level fall.  She had a previous total knee arthroplasty performed in 2007.  Due to the unstable nature and open nature of her injury I recommend proceeding with irrigation and debridement with intramedullary nailing of left tibial shaft fracture.  Risks and benefits were discussed with the patient.  Risks include but not limited to bleeding, infection, malunion, nonunion, hardware failure, hardware irritation, need  for blood vessel injury, DVT, even the possibility of anesthetic complications.  The patient agreed to proceed with surgery and consent was obtained.  Operative Findings: 1.  Irrigation and debridement of type I open tibial shaft fracture 2.  Intramedullary nailing of left tibial shaft fracture using Synthes 9 x 330 mm TNA  Procedure: The patient was identified in the preoperative holding area. Consent was confirmed with the patient and their family and all questions were answered. The operative extremity was marked after confirmation with the patient. she was then brought back to the operating room by our anesthesia colleagues.  She was placed under general anesthetic and carefully transferred over to a radiolucent flat top table.  A bump was placed under her operative hip.  The left lower extremity was then prepped and draped in usual sterile fashion.  A timeout was performed to verify the patient, the procedure, and the extremity.  Preoperative antibiotics were dosed.  Fluoroscopic imaging was obtained showed the unstable nature of her injury.  The small less than 1 cm laceration over the anterior tibia was extended proximally and distally to be able to access the tibia fracture.  A curette was used to debride the fracture.  It was irrigated thoroughly with approximately 1 L of normal saline.  Gloves and instruments were then changed and I turned my attention to the intramedullary nailing portion.  A incision was made through the previous total knee arthroplasty scar.  I mobilized the skin flap to make a lateral parapatellar incision.  Was able to stay extra-articular and I used a threaded guidewire to appropriately position the starting point  just anterior to the tray of the total knee arthroplasty.  I then used an awl to enter the medullary canal.  Reduction maneuver was performed and a ball-tipped guidewire was passed down the center of the canal.  It was seated into the distal metaphysis.  I then  measured the length and chose to use a 330 mm nail.  I then sequentially reamed from 47mm to 10 mm.  I then reamed the proximal entry hole to 11.5 for the proximal diameter of the nail.  I then passed a 9 x 330 mm nail down the side of the canal and seated it appropriately.  I used the targeting arm to place 3 proximal interlocking screws.  I then used perfect circle technique to place 2 medial to lateral distal interlocking screws.  The targeting arm was removed and final fluoroscopic imaging was obtained.  The incisions were copiously irrigated.  A gram of vancomycin powder was placed into the traumatic laceration surgical incision proximally.  A layered closure of 2-0 Vicryl and 3-0 Monocryl for the surgical incisions 2-0 Monocryl and 3-0 nylon for the traumatic laceration.  Sterile dressings were applied.  The patient was then placed in a boot.  She was then awoken from anesthesia and taken to the PACU in stable condition.   Debridement type: Excisional Debridement  Side: left  Body Location: Tibia  Tools used for debridement: scalpel and curette  Pre-debridement Wound size (cm):   Length: 0.5        Width: 0.5     Depth: 0.5   Post-debridement Wound size (cm):   N/A-closed  Debridement depth beyond dead/damaged tissue down to healthy viable tissue: yes  Tissue layer involved: skin, subcutaneous tissue, muscle / fascia, bone  Nature of tissue removed: Devitalized Tissue and Non-viable tissue  Irrigation volume: 1L     Irrigation fluid type: Normal Saline  Post Op Plan/Instructions: Patient will be touchdown weightbearing to left lower extremity.  She will receive postoperative Ancef.  She will receive Lovenox for DVT prophylaxis and then discharged home on aspirin.  She will receive physical and occupational therapy.  I was present and performed the entire surgery.  Patrecia Pace, PA-C did assist me throughout the case. An assistant was necessary given the difficulty in approach,  maintenance of reduction and ability to instrument the fracture.   Katha Hamming, MD [facility]

## 2021-12-24 NOTE — H&P (View-Only) (Signed)
Patient seen and examined and agree with the note below.  Patient is a 61 year old female with a periprosthetic open tibial shaft fracture.  She will require formal irrigation debridement with intramedullary nailing.  I discussed risks and benefits with the patient.  Risks included but not limited to bleeding, infection, malunion, nonunion, hardware failure, hardware irritation, nerve or blood vessel injury DVT, even the possibility anesthetic complications.  She agreed to proceed with surgery and consent was obtained.  Shona Needles, MD [facility] [phone] (office) [email or web address]    Reason for Consult:Left tibia fx Referring Physician: Gerlene Fee Time called: 7035 Time at bedside: [name]   [name] is an 61 y.o. female.  HPI: [name] was standing in front of her microwave when she felt/heard a snap in her left lower leg, had immediate pain, and fell. She bled from a small wound on the front of her shin as well. She was brought to the ED where x-rays showed a tibia fx and orthopedic surgery was consulted. She does not work and lives with her husband.  Past Medical History:  Diagnosis Date   Alcoholic pancreatitis 00/9381   01/15/13   Anemia    Reports being borderline   Anxiety    Chronic lower back pain    Chronic pain    treated at [facility]    Complex tear of lateral meniscus of right knee 09/18/2018   Depression    Fibromyalgia    History of blood transfusion    "related to ORs"   Migraine    "monthly" (03/25/2015)   Osteoarthritis    "wrists, knees, back, neck, fingers,  q joint" (03/25/2015)   Primary localized osteoarthritis of right knee     Past Surgical History:  Procedure Laterality Date   ANTERIOR CERVICAL DECOMP/DISCECTOMY FUSION     2006   BACK SURGERY  2014   CARPAL TUNNEL RELEASE Right 2010   CYSTECTOMY Right 1980's   hand   CYSTECTOMY     "off my mid to lower back"   ENDOMETRIAL ABLATION  ~ 2005   KNEE  ARTHROSCOPY WITH MEDIAL MENISECTOMY Right 09/19/2018   Procedure: KNEE ARTHROSCOPY WITH LATERAL MENISECTOMY AND MEDIAL MENISECTOMY, CHONDROPLASTY;  Surgeon: Elsie Saas, MD;  Location: [place];  Service: Orthopedics;  Laterality: Right;   KNEE SURGERY Left 2007   total knee   KNEE SURGERY     "I've had ~ 4 ORs on this knee in the last 20 yrs" (03/25/2015)   LAPAROSCOPIC CHOLECYSTECTOMY  2011   OSTEOTOMY AND ULNAR SHORTENING Left ~ 2010 X 2   POSTERIOR FUSION CERVICAL SPINE  2008   TONSILLECTOMY  1960's   TOTAL HIP ARTHROPLASTY Left 01/07/2015   Procedure: TOTAL HIP ARTHROPLASTY ANTERIOR APPROACH;  Surgeon: Renette Butters, MD;  Location: [place];  Service: Orthopedics;  Laterality: Left;   TOTAL HIP ARTHROPLASTY Right 03/25/2015   Procedure: RIGHT TOTAL HIP ARTHROPLASTY ANTERIOR APPROACH;  Surgeon: Renette Butters, MD;  Location: [place];  Service: Orthopedics;  Laterality: Right;   TOTAL KNEE ARTHROPLASTY Right 09/21/2021   Procedure: TOTAL KNEE ARTHROPLASTY;  Surgeon: Willaim Sheng, MD;  Location: [facility];  Service: Orthopedics;  Laterality: Right;    Family History  Problem Relation Age of Onset   Cancer Mother    Cancer Sister     Social History:  reports that she has been smoking cigarettes. She has a 17.50 pack-year smoking history. She has never used smokeless tobacco. She reports  current alcohol use. She reports current drug use. Drug: Marijuana.  Allergies:  Allergies  Allergen Reactions   Morphine And Related Itching   Vimovo [Naproxen-Esomeprazole Mg] Swelling    Lip swelling    Medications: I have reviewed the patient's current medications.  Results for orders placed or performed during the hospital encounter of 12/23/21 (from the past 48 hour(s))  Resp Panel by RT-PCR (Flu A&B, Covid) Nasopharyngeal Swab     Status: None   Collection Time: 12/23/21 11:48 PM   Specimen: Nasopharyngeal Swab; Nasopharyngeal(NP) swabs in vial transport medium   Result Value Ref Range   SARS Coronavirus 2 by RT PCR NEGATIVE NEGATIVE    Comment: (NOTE) SARS-CoV-2 target nucleic acids are NOT DETECTED.  The SARS-CoV-2 RNA is generally detectable in upper respiratory specimens during the acute phase of infection. The lowest concentration of SARS-CoV-2 viral copies this assay can detect is 138 copies/mL. A negative result does not preclude SARS-Cov-2 infection and should not be used as the sole basis for treatment or other patient management decisions. A negative result may occur with  improper specimen collection/handling, submission of specimen other than nasopharyngeal swab, presence of viral mutation(s) within the areas targeted by this assay, and inadequate number of viral copies(<138 copies/mL). A negative result must be combined with clinical observations, patient history, and epidemiological information. The expected result is Negative.  Fact Sheet for Patients:  EntrepreneurPulse.com.au  Fact Sheet for Healthcare Providers:  IncredibleEmployment.be  This test is no t yet approved or cleared by the [place] FDA and  has been authorized for detection and/or diagnosis of SARS-CoV-2 by FDA under an Emergency Use Authorization (EUA). This EUA will remain  in effect (meaning this test can be used) for the duration of the COVID-19 declaration under Section 564(b)(1) of the Act, 21 U.S.C.section 360bbb-3(b)(1), unless the authorization is terminated  or revoked sooner.       Influenza A by PCR NEGATIVE NEGATIVE   Influenza B by PCR NEGATIVE NEGATIVE    Comment: (NOTE) The Xpert Xpress SARS-CoV-2/FLU/RSV plus assay is intended as an aid in the diagnosis of influenza from Nasopharyngeal swab specimens and should not be used as a sole basis for treatment. Nasal washings and aspirates are unacceptable for Xpert Xpress SARS-CoV-2/FLU/RSV testing.  Fact Sheet for  Patients: EntrepreneurPulse.com.au  Fact Sheet for Healthcare Providers: IncredibleEmployment.be  This test is not yet approved or cleared by the [place] FDA and has been authorized for detection and/or diagnosis of SARS-CoV-2 by FDA under an Emergency Use Authorization (EUA). This EUA will remain in effect (meaning this test can be used) for the duration of the COVID-19 declaration under Section 564(b)(1) of the Act, 21 U.S.C. section 360bbb-3(b)(1), unless the authorization is terminated or revoked.  Performed at [facility], [place] [address], [place], [place] [address]   Comprehensive metabolic panel     Status: Abnormal   Collection Time: 12/23/21 11:48 PM  Result Value Ref Range   Sodium 127 (L) 135 - 145 mmol/L   Potassium 3.2 (L) 3.5 - 5.1 mmol/L   Chloride 92 (L) 98 - 111 mmol/L   CO2 22 22 - 32 mmol/L   Glucose, Bld 103 (H) 70 - 99 mg/dL    Comment: Glucose reference range applies only to samples taken after fasting for at least 8 hours.   BUN 11 6 - 20 mg/dL   Creatinine, Ser 1.00 0.44 - 1.00 mg/dL   Calcium 8.9 8.9 - 10.3 mg/dL   Total Protein  6.1 (L) 6.5 - 8.1 g/dL   Albumin 3.9 3.5 - 5.0 g/dL   AST 20 15 - 41 U/L   ALT 12 0 - 44 U/L   Alkaline Phosphatase 63 38 - 126 U/L   Total Bilirubin 0.4 0.3 - 1.2 mg/dL   GFR, Estimated >60 >60 mL/min    Comment: (NOTE) Calculated using the CKD-EPI Creatinine Equation (2021)    Anion gap 13 5 - 15    Comment: Performed at [facility] [address], [place], [place] [address]  CBC     Status: Abnormal   Collection Time: 12/23/21 11:48 PM  Result Value Ref Range   WBC 7.1 4.0 - 10.5 K/uL   RBC 4.01 3.87 - 5.11 MIL/uL   Hemoglobin 12.1 12.0 - 15.0 g/dL   HCT 35.6 (L) 36.0 - 46.0 %   MCV 88.8 80.0 - 100.0 fL   MCH 30.2 26.0 - 34.0 pg   MCHC 34.0 30.0 - 36.0 g/dL   RDW 14.2 11.5 - 15.5 %   Platelets 130 (L) 150 - 400 K/uL   nRBC 0.0 0.0 - 0.2 %    Comment:  Performed at [facility], [place] [address] [place], [place] [address]  Ethanol     Status: Abnormal   Collection Time: 12/23/21 11:48 PM  Result Value Ref Range   Alcohol, Ethyl (B) 110 (H) <10 mg/dL    Comment: (NOTE) Lowest detectable limit for serum alcohol is 10 mg/dL.  For medical purposes only. Performed at [facility], [place] [address], [place], [place] [address]   Lactic acid, plasma     Status: Abnormal   Collection Time: 12/23/21 11:48 PM  Result Value Ref Range   Lactic Acid, Venous 4.8 (HH) 0.5 - 1.9 mmol/L    Comment: CRITICAL RESULT CALLED TO, READ BACK BY AND VERIFIED WITH: GRACE TATE RN 12/24/21 0045 Wiliam Ke Performed at [facility], [place] [address], [place], [place] [address]   Protime-INR     Status: None   Collection Time: 12/23/21 11:48 PM  Result Value Ref Range   Prothrombin Time 12.7 11.4 - 15.2 seconds   INR 1.0 0.8 - 1.2    Comment: (NOTE) INR goal varies based on device and disease states. Performed at [facility], [place] [address], [place], [place] [address]   Type and screen [place]     Status: None   Collection Time: 12/23/21 11:55 PM  Result Value Ref Range   ABO/RH(D) O NEG    Antibody Screen NEG    Sample Expiration      12/26/2021,2359 Performed at [facility], [place] [address], [place], [place] [address]   I-Stat Chem 8, ED     Status: Abnormal   Collection Time: 12/23/21 11:56 PM  Result Value Ref Range   Sodium 127 (L) 135 - 145 mmol/L   Potassium 3.0 (L) 3.5 - 5.1 mmol/L   Chloride 90 (L) 98 - 111 mmol/L   BUN 12 6 - 20 mg/dL   Creatinine, Ser 1.10 (H) 0.44 - 1.00 mg/dL   Glucose, Bld 98 70 - 99 mg/dL    Comment: Glucose reference range applies only to samples taken after fasting for at least 8 hours.   Calcium, Ion 1.09 (L) 1.15 - 1.40 mmol/L   TCO2 24 22 - 32 mmol/L   Hemoglobin 13.3 12.0 - 15.0 g/dL   HCT 39.0 36.0 - 46.0 %  Urinalysis, Routine w reflex microscopic      Status: Abnormal   Collection Time: 12/24/21  2:30 AM  Result Value Ref Range   Color, Urine STRAW (A) YELLOW   APPearance CLEAR CLEAR   Specific Gravity, Urine <1.005 (L) 1.005 - 1.030   pH 6.0 5.0 - 8.0   Glucose, UA NEGATIVE NEGATIVE mg/dL   Hgb urine dipstick TRACE (A) NEGATIVE   Bilirubin Urine NEGATIVE NEGATIVE   Ketones, ur NEGATIVE NEGATIVE mg/dL   Protein, ur NEGATIVE NEGATIVE mg/dL   Nitrite NEGATIVE NEGATIVE   Leukocytes,Ua NEGATIVE NEGATIVE    Comment: Performed at [facility] [address], [place], [place] [address]  Urinalysis, Microscopic (reflex)     Status: None   Collection Time: 12/24/21  2:30 AM  Result Value Ref Range   RBC / HPF 0-5 0 - 5 RBC/hpf   WBC, UA 0-5 0 - 5 WBC/hpf   Bacteria, UA NONE SEEN NONE SEEN   Squamous Epithelial / LPF 0-5 0 - 5   Mucus PRESENT     Comment: Performed at [facility], [place] [address] [place], [place] [address]  Lactic acid, plasma     Status: Abnormal   Collection Time: 12/24/21  2:34 AM  Result Value Ref Range   Lactic Acid, Venous 4.0 (HH) 0.5 - 1.9 mmol/L    Comment: CRITICAL VALUE NOTED.  VALUE IS CONSISTENT WITH PREVIOUSLY REPORTED AND CALLED VALUE. Performed at [facility], [place] [address], [place], [place] [address]   Lactic acid, plasma     Status: None   Collection Time: 12/24/21  5:47 AM  Result Value Ref Range   Lactic Acid, Venous 0.6 0.5 - 1.9 mmol/L    Comment: Performed at [facility] [address], [place], [place] [address]    CT Tibia Fibula Left Wo Contrast  Result Date: 12/24/2021 CLINICAL DATA:  Fracture. EXAM: CT OF THE LOWER LEFT EXTREMITY WITHOUT CONTRAST TECHNIQUE: Multidetector CT imaging of the lower left extremity was performed according to the standard protocol. RADIATION DOSE REDUCTION: This exam was performed according to the departmental dose-optimization program which includes automated exposure control, adjustment of the mA and/or kV according to patient size  and/or use of iterative reconstruction technique. COMPARISON:  X-ray same day. FINDINGS: Bones/Joint/Cartilage There is an acute comminuted oblique fracture through the mid and proximal tibial diaphysis. There is mild apex anterior angulation with 1 cm of anterior displacement of the distal fracture fragment. This extends to, but does not involve lateral aspect of the tibial plateau. There is an acute oblique comminuted fracture through the proximal fibular diaphysis. There is mild apex anterior angulation 1 cm of anterior displacement of the distal fracture fragment. Left knee total arthroplasty is present without evidence for hardware loosening. Alignment is anatomic. Joint spaces are well maintained. The bones are diffusely osteopenic. There is small low-density joint effusion identified in the knee along with knee calcifications in fact, likely sequelae from prior surgery. Ligaments Suboptimally assessed by CT. Muscles and Tendons Within normal limits. Soft tissues No focal hematoma identified. There is some subcutaneous edema near the lateral malleolus. IMPRESSION: 1. Comminuted fracture involving the proximal and mid tibial diaphysis. 2. Comminuted fracture of the proximal fibular diaphysis. 3. Knee arthroplasty appears intact. 4. Small likely chronic knee joint effusion. Electronically Signed   By: Ronney Asters M.D.   On: 12/24/2021 01:19   DG Tibia/Fibula Left Port  Result Date: 12/24/2021 CLINICAL DATA:  Trauma, fall.  EXAM: PORTABLE LEFT TIBIA AND FIBULA - 2 VIEW; LEFT FEMUR PORTABLE 1 VIEW COMPARISON:  None. FINDINGS: There is a minimally displaced spiral fracture of the mid tibial diaphysis, not well evaluated on portable view. There is a mildly displaced fracture of the proximal fibula diaphysis. Total hip and knee arthroplasty changes are noted on the with no evidence of hardware loosening. The soft tissues are within normal limits. IMPRESSION: 1. Mildly displaced fractures of the tibial and fibula  diaphysis. 2. Status post total hip and total knee arthroplasty with no evidence of hardware loosening. Electronically Signed   By: Brett Fairy M.D.   On: 12/24/2021 00:17   DG Femur Portable 1 View Left  Result Date: 12/24/2021 CLINICAL DATA:  Trauma, fall. EXAM: PORTABLE LEFT TIBIA AND FIBULA - 2 VIEW; LEFT FEMUR PORTABLE 1 VIEW COMPARISON:  None. FINDINGS: There is a minimally displaced spiral fracture of the mid tibial diaphysis, not well evaluated on portable view. There is a mildly displaced fracture of the proximal fibula diaphysis. Total hip and knee arthroplasty changes are noted on the with no evidence of hardware loosening. The soft tissues are within normal limits. IMPRESSION: 1. Mildly displaced fractures of the tibial and fibula diaphysis. 2. Status post total hip and total knee arthroplasty with no evidence of hardware loosening. Electronically Signed   By: Brett Fairy M.D.   On: 12/24/2021 00:17    Review of Systems  HENT:  Negative for ear discharge, ear pain, hearing loss and tinnitus.   Eyes:  Negative for photophobia and pain.  Respiratory:  Negative for cough and shortness of breath.   Cardiovascular:  Negative for chest pain.  Gastrointestinal:  Negative for abdominal pain, nausea and vomiting.  Genitourinary:  Negative for dysuria, flank pain, frequency and urgency.  Musculoskeletal:  Positive for arthralgias (Left lower leg). Negative for back pain, myalgias and neck pain.  Neurological:  Negative for dizziness and headaches.  Hematological:  Does not bruise/bleed easily.  Psychiatric/Behavioral:  The patient is not nervous/anxious.   Blood pressure 121/76, pulse 88, temperature 98 F (36.7 C), resp. rate 11, height 5\' 6"  (1.676 m), weight 68 kg, SpO2 94 %. Physical Exam Constitutional:      General: She is not in acute distress.    Appearance: She is well-developed. She is not diaphoretic.  HENT:     Head: Normocephalic and atraumatic.  Eyes:     General: No  scleral icterus.       Right eye: No discharge.        Left eye: No discharge.     Conjunctiva/sclera: Conjunctivae normal.  Cardiovascular:     Rate and Rhythm: Normal rate and regular rhythm.  Pulmonary:     Effort: Pulmonary effort is normal. No respiratory distress.  Musculoskeletal:     Cervical back: Normal range of motion.     Comments: LLE No traumatic wounds, ecchymosis, or rash  Short leg splint in place  No knee effusion  Knee stable to varus/ valgus and anterior/posterior stress  Sens DPN, SPN, TN intact  Motor EHL 5/5  Toes perfused, 1+ NP edema  Skin:    General: Skin is warm and dry.  Neurological:     Mental Status: She is alert.  Psychiatric:        Mood and Affect: Mood normal.        Behavior: Behavior normal.    Assessment/Plan: Left open tib/fib fx -- Plan IMN today with Dr. Doreatha Martin. Please keep NPO.  Lisette Abu, PA-C Orthopedic Surgery [phone] 12/24/2021, 9:59 AM

## 2021-12-24 NOTE — Transfer of Care (Signed)
Immediate Anesthesia Transfer of Care Note  Patient: [name]  Procedure(s) Performed: INTRAMEDULLARY (IM) NAIL TIBIAL (Left) IRRIGATION AND DEBRIDEMENT EXTREMITY (Left)  Patient Location: PACU  Anesthesia Type:General and GA combined with regional for post-op pain  Level of Consciousness: awake, alert  and oriented  Airway & Oxygen Therapy: Patient Spontanous Breathing  Post-op Assessment: Report given to RN and Post -op Vital signs reviewed and stable  Post vital signs: Reviewed and stable  Last Vitals:  Vitals Value Taken Time  BP 128/61 12/24/21 1350  Temp    Pulse 114 12/24/21 1351  Resp 14 12/24/21 1351  SpO2 100 % 12/24/21 1351  Vitals shown include unvalidated device data.  Last Pain:  Vitals:   12/24/21 1015  TempSrc: Oral  PainSc:       Patients Stated Pain Goal: 3 (18/86/77 3736)  Complications: No notable events documented.

## 2021-12-24 NOTE — Progress Notes (Signed)
Orthopedic Tech Progress Note Patient Details:  [name] 1961/03/27 [id number]  Ortho Devices Type of Ortho Device: CAM walker Ortho Device/Splint Location: LLE Ortho Device/Splint Interventions: Ordered   Post Interventions Patient Tolerated: Fair Instructions Provided: Care of device, Poper ambulation with device Dropped off at OR desk.  [name] 12/24/2021, 1:12 PM

## 2021-12-24 NOTE — ED Notes (Signed)
Pt reports numbness in L foot has improved

## 2021-12-24 NOTE — Consult Note (Addendum)
Patient seen and examined and agree with the note below.  Patient is a 61 year old female with a periprosthetic open tibial shaft fracture.  She will require formal irrigation debridement with intramedullary nailing.  I discussed risks and benefits with the patient.  Risks included but not limited to bleeding, infection, malunion, nonunion, hardware failure, hardware irritation, nerve or blood vessel injury DVT, even the possibility anesthetic complications.  She agreed to proceed with surgery and consent was obtained.  Shona Needles, MD [facility] [phone] (office) [email or web address]    Reason for Consult:Left tibia fx Referring Physician: Gerlene Fee Time called: [id number] Time at bedside: [place]   [name] is an 61 y.o. female.  HPI: [name] was standing in front of her microwave when she felt/heard a snap in her left lower leg, had immediate pain, and fell. She bled from a small wound on the front of her shin as well. She was brought to the ED where x-rays showed a tibia fx and orthopedic surgery was consulted. She does not work and lives with her husband.  Past Medical History:  Diagnosis Date   Alcoholic pancreatitis 35/7017   01/15/13   Anemia    Reports being borderline   Anxiety    Chronic lower back pain    Chronic pain    treated at [facility]    Complex tear of lateral meniscus of right knee 09/18/2018   Depression    Fibromyalgia    History of blood transfusion    "related to ORs"   Migraine    "monthly" (03/25/2015)   Osteoarthritis    "wrists, knees, back, neck, fingers,  q joint" (03/25/2015)   Primary localized osteoarthritis of right knee     Past Surgical History:  Procedure Laterality Date   ANTERIOR CERVICAL DECOMP/DISCECTOMY FUSION     2006   BACK SURGERY  2014   CARPAL TUNNEL RELEASE Right 2010   CYSTECTOMY Right 1980's   hand   CYSTECTOMY     "off my mid to lower back"   ENDOMETRIAL ABLATION  ~ 2005   KNEE  ARTHROSCOPY WITH MEDIAL MENISECTOMY Right 09/19/2018   Procedure: KNEE ARTHROSCOPY WITH LATERAL MENISECTOMY AND MEDIAL MENISECTOMY, CHONDROPLASTY;  Surgeon: Elsie Saas, MD;  Location: [facility];  Service: Orthopedics;  Laterality: Right;   KNEE SURGERY Left 2007   total knee   KNEE SURGERY     "I've had ~ 4 ORs on this knee in the last 20 yrs" (03/25/2015)   LAPAROSCOPIC CHOLECYSTECTOMY  2011   OSTEOTOMY AND ULNAR SHORTENING Left ~ 2010 X 2   POSTERIOR FUSION CERVICAL SPINE  2008   TONSILLECTOMY  1960's   TOTAL HIP ARTHROPLASTY Left 01/07/2015   Procedure: TOTAL HIP ARTHROPLASTY ANTERIOR APPROACH;  Surgeon: Renette Butters, MD;  Location: [facility];  Service: Orthopedics;  Laterality: Left;   TOTAL HIP ARTHROPLASTY Right 03/25/2015   Procedure: RIGHT TOTAL HIP ARTHROPLASTY ANTERIOR APPROACH;  Surgeon: Renette Butters, MD;  Location: [facility];  Service: Orthopedics;  Laterality: Right;   TOTAL KNEE ARTHROPLASTY Right 09/21/2021   Procedure: TOTAL KNEE ARTHROPLASTY;  Surgeon: Willaim Sheng, MD;  Location: [facility];  Service: Orthopedics;  Laterality: Right;    Family History  Problem Relation Age of Onset   Cancer Mother    Cancer Sister     Social History:  reports that she has been smoking cigarettes. She has a 17.50 pack-year smoking history. She has never used smokeless tobacco. She reports  current alcohol use. She reports current drug use. Drug: Marijuana.  Allergies:  Allergies  Allergen Reactions   Morphine And Related Itching   Vimovo [Naproxen-Esomeprazole Mg] Swelling    Lip swelling    Medications: I have reviewed the patient's current medications.  Results for orders placed or performed during the hospital encounter of 12/23/21 (from the past 48 hour(s))  Resp Panel by RT-PCR (Flu A&B, Covid) Nasopharyngeal Swab     Status: None   Collection Time: 12/23/21 11:48 PM   Specimen: Nasopharyngeal Swab; Nasopharyngeal(NP) swabs in vial transport medium   Result Value Ref Range   SARS Coronavirus 2 by RT PCR NEGATIVE NEGATIVE    Comment: (NOTE) SARS-CoV-2 target nucleic acids are NOT DETECTED.  The SARS-CoV-2 RNA is generally detectable in upper respiratory specimens during the acute phase of infection. The lowest concentration of SARS-CoV-2 viral copies this assay can detect is 138 copies/mL. A negative result does not preclude SARS-Cov-2 infection and should not be used as the sole basis for treatment or other patient management decisions. A negative result may occur with  improper specimen collection/handling, submission of specimen other than nasopharyngeal swab, presence of viral mutation(s) within the areas targeted by this assay, and inadequate number of viral copies(<138 copies/mL). A negative result must be combined with clinical observations, patient history, and epidemiological information. The expected result is Negative.  Fact Sheet for Patients:  EntrepreneurPulse.com.au  Fact Sheet for Healthcare Providers:  IncredibleEmployment.be  This test is no t yet approved or cleared by the [place] FDA and  has been authorized for detection and/or diagnosis of SARS-CoV-2 by FDA under an Emergency Use Authorization (EUA). This EUA will remain  in effect (meaning this test can be used) for the duration of the COVID-19 declaration under Section 564(b)(1) of the Act, 21 U.S.C.section 360bbb-3(b)(1), unless the authorization is terminated  or revoked sooner.       Influenza A by PCR NEGATIVE NEGATIVE   Influenza B by PCR NEGATIVE NEGATIVE    Comment: (NOTE) The Xpert Xpress SARS-CoV-2/FLU/RSV plus assay is intended as an aid in the diagnosis of influenza from Nasopharyngeal swab specimens and should not be used as a sole basis for treatment. Nasal washings and aspirates are unacceptable for Xpert Xpress SARS-CoV-2/FLU/RSV testing.  Fact Sheet for  Patients: EntrepreneurPulse.com.au  Fact Sheet for Healthcare Providers: IncredibleEmployment.be  This test is not yet approved or cleared by the [place] FDA and has been authorized for detection and/or diagnosis of SARS-CoV-2 by FDA under an Emergency Use Authorization (EUA). This EUA will remain in effect (meaning this test can be used) for the duration of the COVID-19 declaration under Section 564(b)(1) of the Act, 21 U.S.C. section 360bbb-3(b)(1), unless the authorization is terminated or revoked.  Performed at [facility], [place] [address], [place], [place] [address]   Comprehensive metabolic panel     Status: Abnormal   Collection Time: 12/23/21 11:48 PM  Result Value Ref Range   Sodium 127 (L) 135 - 145 mmol/L   Potassium 3.2 (L) 3.5 - 5.1 mmol/L   Chloride 92 (L) 98 - 111 mmol/L   CO2 22 22 - 32 mmol/L   Glucose, Bld 103 (H) 70 - 99 mg/dL    Comment: Glucose reference range applies only to samples taken after fasting for at least 8 hours.   BUN 11 6 - 20 mg/dL   Creatinine, Ser 1.00 0.44 - 1.00 mg/dL   Calcium 8.9 8.9 - 10.3 mg/dL   Total Protein  6.1 (L) 6.5 - 8.1 g/dL   Albumin 3.9 3.5 - 5.0 g/dL   AST 20 15 - 41 U/L   ALT 12 0 - 44 U/L   Alkaline Phosphatase 63 38 - 126 U/L   Total Bilirubin 0.4 0.3 - 1.2 mg/dL   GFR, Estimated >60 >60 mL/min    Comment: (NOTE) Calculated using the CKD-EPI Creatinine Equation (2021)    Anion gap 13 5 - 15    Comment: Performed at [facility] [address], [place], [place] [address]  CBC     Status: Abnormal   Collection Time: 12/23/21 11:48 PM  Result Value Ref Range   WBC 7.1 4.0 - 10.5 K/uL   RBC 4.01 3.87 - 5.11 MIL/uL   Hemoglobin 12.1 12.0 - 15.0 g/dL   HCT 35.6 (L) 36.0 - 46.0 %   MCV 88.8 80.0 - 100.0 fL   MCH 30.2 26.0 - 34.0 pg   MCHC 34.0 30.0 - 36.0 g/dL   RDW 14.2 11.5 - 15.5 %   Platelets 130 (L) 150 - 400 K/uL   nRBC 0.0 0.0 - 0.2 %    Comment:  Performed at [facility], [place] [address], [place], [place] [address]  Ethanol     Status: Abnormal   Collection Time: 12/23/21 11:48 PM  Result Value Ref Range   Alcohol, Ethyl (B) 110 (H) <10 mg/dL    Comment: (NOTE) Lowest detectable limit for serum alcohol is 10 mg/dL.  For medical purposes only. Performed at [facility], [place] [address] [place], [place] [address]   Lactic acid, plasma     Status: Abnormal   Collection Time: 12/23/21 11:48 PM  Result Value Ref Range   Lactic Acid, Venous 4.8 (HH) 0.5 - 1.9 mmol/L    Comment: CRITICAL RESULT CALLED TO, READ BACK BY AND VERIFIED WITH: GRACE TATE RN 12/24/21 0045 Wiliam Ke Performed at [facility], [place] [address], [place], [place] [address]   Protime-INR     Status: None   Collection Time: 12/23/21 11:48 PM  Result Value Ref Range   Prothrombin Time 12.7 11.4 - 15.2 seconds   INR 1.0 0.8 - 1.2    Comment: (NOTE) INR goal varies based on device and disease states. Performed at [facility], [place] [address] [place], [place] [address]   Type and screen [place]     Status: None   Collection Time: 12/23/21 11:55 PM  Result Value Ref Range   ABO/RH(D) O NEG    Antibody Screen NEG    Sample Expiration      12/26/2021,2359 Performed at [facility], [place] [address], [place], [place] [address]   I-Stat Chem 8, ED     Status: Abnormal   Collection Time: 12/23/21 11:56 PM  Result Value Ref Range   Sodium 127 (L) 135 - 145 mmol/L   Potassium 3.0 (L) 3.5 - 5.1 mmol/L   Chloride 90 (L) 98 - 111 mmol/L   BUN 12 6 - 20 mg/dL   Creatinine, Ser 1.10 (H) 0.44 - 1.00 mg/dL   Glucose, Bld 98 70 - 99 mg/dL    Comment: Glucose reference range applies only to samples taken after fasting for at least 8 hours.   Calcium, Ion 1.09 (L) 1.15 - 1.40 mmol/L   TCO2 24 22 - 32 mmol/L   Hemoglobin 13.3 12.0 - 15.0 g/dL   HCT 39.0 36.0 - 46.0 %  Urinalysis, Routine w reflex microscopic      Status: Abnormal   Collection Time: 12/24/21  2:30 AM  Result Value Ref Range   Color, Urine STRAW (A) YELLOW   APPearance CLEAR CLEAR   Specific Gravity, Urine <1.005 (L) 1.005 - 1.030   pH 6.0 5.0 - 8.0   Glucose, UA NEGATIVE NEGATIVE mg/dL   Hgb urine dipstick TRACE (A) NEGATIVE   Bilirubin Urine NEGATIVE NEGATIVE   Ketones, ur NEGATIVE NEGATIVE mg/dL   Protein, ur NEGATIVE NEGATIVE mg/dL   Nitrite NEGATIVE NEGATIVE   Leukocytes,Ua NEGATIVE NEGATIVE    Comment: Performed at [facility] [address] [place], [place] [address]  Urinalysis, Microscopic (reflex)     Status: None   Collection Time: 12/24/21  2:30 AM  Result Value Ref Range   RBC / HPF 0-5 0 - 5 RBC/hpf   WBC, UA 0-5 0 - 5 WBC/hpf   Bacteria, UA NONE SEEN NONE SEEN   Squamous Epithelial / LPF 0-5 0 - 5   Mucus PRESENT     Comment: Performed at [facility], [place] [address], [place], [place] [address]  Lactic acid, plasma     Status: Abnormal   Collection Time: 12/24/21  2:34 AM  Result Value Ref Range   Lactic Acid, Venous 4.0 (HH) 0.5 - 1.9 mmol/L    Comment: CRITICAL VALUE NOTED.  VALUE IS CONSISTENT WITH PREVIOUSLY REPORTED AND CALLED VALUE. Performed at [facility], [place] [address], [place], [place] [address]   Lactic acid, plasma     Status: None   Collection Time: 12/24/21  5:47 AM  Result Value Ref Range   Lactic Acid, Venous 0.6 0.5 - 1.9 mmol/L    Comment: Performed at [facility] [address] [place], [place] [address]    CT Tibia Fibula Left Wo Contrast  Result Date: 12/24/2021 CLINICAL DATA:  Fracture. EXAM: CT OF THE LOWER LEFT EXTREMITY WITHOUT CONTRAST TECHNIQUE: Multidetector CT imaging of the lower left extremity was performed according to the standard protocol. RADIATION DOSE REDUCTION: This exam was performed according to the departmental dose-optimization program which includes automated exposure control, adjustment of the mA and/or kV according to patient size  and/or use of iterative reconstruction technique. COMPARISON:  X-ray same day. FINDINGS: Bones/Joint/Cartilage There is an acute comminuted oblique fracture through the mid and proximal tibial diaphysis. There is mild apex anterior angulation with 1 cm of anterior displacement of the distal fracture fragment. This extends to, but does not involve lateral aspect of the tibial plateau. There is an acute oblique comminuted fracture through the proximal fibular diaphysis. There is mild apex anterior angulation 1 cm of anterior displacement of the distal fracture fragment. Left knee total arthroplasty is present without evidence for hardware loosening. Alignment is anatomic. Joint spaces are well maintained. The bones are diffusely osteopenic. There is small low-density joint effusion identified in the knee along with knee calcifications in fact, likely sequelae from prior surgery. Ligaments Suboptimally assessed by CT. Muscles and Tendons Within normal limits. Soft tissues No focal hematoma identified. There is some subcutaneous edema near the lateral malleolus. IMPRESSION: 1. Comminuted fracture involving the proximal and mid tibial diaphysis. 2. Comminuted fracture of the proximal fibular diaphysis. 3. Knee arthroplasty appears intact. 4. Small likely chronic knee joint effusion. Electronically Signed   By: Ronney Asters M.D.   On: 12/24/2021 01:19   DG Tibia/Fibula Left Port  Result Date: 12/24/2021 CLINICAL DATA:  Trauma, fall.  EXAM: PORTABLE LEFT TIBIA AND FIBULA - 2 VIEW; LEFT FEMUR PORTABLE 1 VIEW COMPARISON:  None. FINDINGS: There is a minimally displaced spiral fracture of the mid tibial diaphysis, not well evaluated on portable view. There is a mildly displaced fracture of the proximal fibula diaphysis. Total hip and knee arthroplasty changes are noted on the with no evidence of hardware loosening. The soft tissues are within normal limits. IMPRESSION: 1. Mildly displaced fractures of the tibial and fibula  diaphysis. 2. Status post total hip and total knee arthroplasty with no evidence of hardware loosening. Electronically Signed   By: Brett Fairy M.D.   On: 12/24/2021 00:17   DG Femur Portable 1 View Left  Result Date: 12/24/2021 CLINICAL DATA:  Trauma, fall. EXAM: PORTABLE LEFT TIBIA AND FIBULA - 2 VIEW; LEFT FEMUR PORTABLE 1 VIEW COMPARISON:  None. FINDINGS: There is a minimally displaced spiral fracture of the mid tibial diaphysis, not well evaluated on portable view. There is a mildly displaced fracture of the proximal fibula diaphysis. Total hip and knee arthroplasty changes are noted on the with no evidence of hardware loosening. The soft tissues are within normal limits. IMPRESSION: 1. Mildly displaced fractures of the tibial and fibula diaphysis. 2. Status post total hip and total knee arthroplasty with no evidence of hardware loosening. Electronically Signed   By: Brett Fairy M.D.   On: 12/24/2021 00:17    Review of Systems  HENT:  Negative for ear discharge, ear pain, hearing loss and tinnitus.   Eyes:  Negative for photophobia and pain.  Respiratory:  Negative for cough and shortness of breath.   Cardiovascular:  Negative for chest pain.  Gastrointestinal:  Negative for abdominal pain, nausea and vomiting.  Genitourinary:  Negative for dysuria, flank pain, frequency and urgency.  Musculoskeletal:  Positive for arthralgias (Left lower leg). Negative for back pain, myalgias and neck pain.  Neurological:  Negative for dizziness and headaches.  Hematological:  Does not bruise/bleed easily.  Psychiatric/Behavioral:  The patient is not nervous/anxious.   Blood pressure 121/76, pulse 88, temperature 98 F (36.7 C), resp. rate 11, height 5\' 6"  (1.676 m), weight 68 kg, SpO2 94 %. Physical Exam Constitutional:      General: She is not in acute distress.    Appearance: She is well-developed. She is not diaphoretic.  HENT:     Head: Normocephalic and atraumatic.  Eyes:     General: No  scleral icterus.       Right eye: No discharge.        Left eye: No discharge.     Conjunctiva/sclera: Conjunctivae normal.  Cardiovascular:     Rate and Rhythm: Normal rate and regular rhythm.  Pulmonary:     Effort: Pulmonary effort is normal. No respiratory distress.  Musculoskeletal:     Cervical back: Normal range of motion.     Comments: LLE No traumatic wounds, ecchymosis, or rash  Short leg splint in place  No knee effusion  Knee stable to varus/ valgus and anterior/posterior stress  Sens DPN, SPN, TN intact  Motor EHL 5/5  Toes perfused, 1+ NP edema  Skin:    General: Skin is warm and dry.  Neurological:     Mental Status: She is alert.  Psychiatric:        Mood and Affect: Mood normal.        Behavior: Behavior normal.    Assessment/Plan: Left open tib/fib fx -- Plan IMN today with Dr. Doreatha Martin. Please keep NPO.  Lisette Abu, PA-C Orthopedic Surgery [phone] 12/24/2021, 9:59 AM

## 2021-12-25 ENCOUNTER — Encounter (HOSPITAL_COMMUNITY): Payer: Self-pay | Admitting: Student

## 2021-12-25 LAB — CBC
HCT: 21.9 % — ABNORMAL LOW (ref 36.0–46.0)
Hemoglobin: 7.5 g/dL — ABNORMAL LOW (ref 12.0–15.0)
MCH: 30.7 pg (ref 26.0–34.0)
MCHC: 34.2 g/dL (ref 30.0–36.0)
MCV: 89.8 fL (ref 80.0–100.0)
Platelets: 132 10*3/uL — ABNORMAL LOW (ref 150–400)
RBC: 2.44 MIL/uL — ABNORMAL LOW (ref 3.87–5.11)
RDW: 14.2 % (ref 11.5–15.5)
WBC: 4.7 10*3/uL (ref 4.0–10.5)
nRBC: 0 % (ref 0.0–0.2)

## 2021-12-25 LAB — BASIC METABOLIC PANEL
Anion gap: 6 (ref 5–15)
BUN: 6 mg/dL (ref 6–20)
CO2: 27 mmol/L (ref 22–32)
Calcium: 8.2 mg/dL — ABNORMAL LOW (ref 8.9–10.3)
Chloride: 101 mmol/L (ref 98–111)
Creatinine, Ser: 0.64 mg/dL (ref 0.44–1.00)
GFR, Estimated: >60 mL/min (ref >60)
Glucose, Bld: 128 mg/dL — ABNORMAL HIGH (ref 70–99)
Potassium: 4 mmol/L (ref 3.5–5.1)
Sodium: 134 mmol/L — ABNORMAL LOW (ref 135–145)

## 2021-12-25 LAB — VITAMIN D 25 HYDROXY (VIT D DEFICIENCY, FRACTURES): Vit D, 25-Hydroxy: 28.58 ng/mL — ABNORMAL LOW (ref 30–100)

## 2021-12-25 MED ORDER — ASPIRIN EC 325 MG PO TBEC: 325.0000 mg | DELAYED_RELEASE_TABLET | Freq: Two times a day (BID) | ORAL | 0 refills | Status: AC

## 2021-12-25 MED ORDER — METHOCARBAMOL 500 MG PO TABS
500.0000 mg | ORAL_TABLET | Freq: Four times a day (QID) | ORAL | 0 refills | Status: AC | PRN
Start: 2021-12-25 — End: ?

## 2021-12-25 MED ORDER — ONDANSETRON HCL 4 MG PO TABS: 4.0000 mg | ORAL_TABLET | Freq: Four times a day (QID) | ORAL | 0 refills | Status: AC | PRN

## 2021-12-25 MED ORDER — SODIUM CHLORIDE 0.9 % IV BOLUS
1000.0000 mL | Freq: Once | INTRAVENOUS | Status: AC
  Administered 2021-12-25: 1000 mL via INTRAVENOUS

## 2021-12-25 MED ORDER — VITAMIN D 25 MCG (1000 UNIT) PO TABS
1000.0000 [IU] | ORAL_TABLET | Freq: Every day | ORAL | Status: DC
  Administered 2021-12-25 – 2021-12-27 (×3): 1000 [IU] via ORAL
  Filled 2021-12-25 (×3): qty 1

## 2021-12-25 MED ORDER — VITAMIN D3 25 MCG PO TABS: 1000.0000 [IU] | ORAL_TABLET | Freq: Every day | ORAL | 2 refills | Status: AC

## 2021-12-25 MED ORDER — OXYCODONE-ACETAMINOPHEN 5-325 MG PO TABS: 1.0000 | ORAL_TABLET | ORAL | 0 refills | Status: AC | PRN

## 2021-12-25 MED ORDER — ACETAMINOPHEN 500 MG PO TABS
1000.0000 mg | ORAL_TABLET | Freq: Four times a day (QID) | ORAL | Status: DC
  Administered 2021-12-25 – 2021-12-27 (×7): 1000 mg via ORAL
  Filled 2021-12-25 (×7): qty 2

## 2021-12-25 NOTE — Discharge Instructions (Signed)
[facility] Discharge Instructions   General Discharge Instructions  WEIGHT BEARING STATUS: Touchdown weightbearing left leg  RANGE OF MOTION/ACTIVITY: Ok to come out of boot for gentle ankle range of motion as tolerated  Wound Care: You may remove your surgical dressing. Leave the white steri-strips in place. Incisions can be left open to air if there is no drainage. If incision continues to have drainage, follow wound care instructions below. Okay to shower if no drainage from incisions.  DVT/PE prophylaxis: Aspirin  Diet: as you were eating previously.  Can use over the counter stool softeners and bowel preparations, such as Miralax, to help with bowel movements.  Narcotics can be constipating.  Be sure to drink plenty of fluids  PAIN MEDICATION USE AND EXPECTATIONS  You have likely been given narcotic medications to help control your pain.  After a traumatic event that results in an fracture (broken bone) with or without surgery, it is ok to use narcotic pain medications to help control one's pain.  We understand that everyone responds to pain differently and each individual patient will be evaluated on a regular basis for the continued need for narcotic medications. Ideally, narcotic medication use should last no more than 6-8 weeks (coinciding with fracture healing).   As a patient it is your responsibility as well to monitor narcotic medication use and report the amount and frequency you use these medications when you come to your office visit.   We would also advise that if you are using narcotic medications, you should take a dose prior to therapy to maximize you participation.  IF YOU ARE ON NARCOTIC MEDICATIONS IT IS NOT PERMISSIBLE TO OPERATE A MOTOR VEHICLE (MOTORCYCLE/CAR/TRUCK/MOPED) OR HEAVY MACHINERY DO NOT MIX NARCOTICS WITH OTHER CNS (CENTRAL NERVOUS SYSTEM) DEPRESSANTS SUCH AS ALCOHOL   STOP SMOKING OR USING NICOTINE PRODUCTS!!!!  As discussed nicotine  severely impairs your body's ability to heal surgical and traumatic wounds but also impairs bone healing.  Wounds and bone heal by forming microscopic blood vessels (angiogenesis) and nicotine is a vasoconstrictor (essentially, shrinks blood vessels).  Therefore, if vasoconstriction occurs to these microscopic blood vessels they essentially disappear and are unable to deliver necessary nutrients to the healing tissue.  This is one modifiable factor that you can do to dramatically increase your chances of healing your injury.    (This means no smoking, no nicotine gum, patches, etc)  DO NOT USE NONSTEROIDAL ANTI-INFLAMMATORY DRUGS (NSAID'S)  Using products such as Advil (ibuprofen), Aleve (naproxen), Motrin (ibuprofen) for additional pain control during fracture healing can delay and/or prevent the healing response.  If you would like to take over the counter (OTC) medication, Tylenol (acetaminophen) is ok.  However, some narcotic medications that are given for pain control contain acetaminophen as well. Therefore, you should not exceed more than 4000 mg of tylenol in a day if you do not have liver disease.  Also note that there are may OTC medicines, such as cold medicines and allergy medicines that my contain tylenol as well.  If you have any questions about medications and/or interactions please ask your doctor/PA or your pharmacist.      ICE AND ELEVATE INJURED/OPERATIVE EXTREMITY  Using ice and elevating the injured extremity above your heart can help with swelling and pain control.  Icing in a pulsatile fashion, such as 20 minutes on and 20 minutes off, can be followed.    Do not place ice directly on skin. Make sure there is a barrier between to  skin and the ice pack.    Using frozen items such as frozen peas works well as the conform nicely to the are that needs to be iced.  USE AN ACE WRAP OR TED HOSE FOR SWELLING CONTROL  In addition to icing and elevation, Ace wraps or TED hose are used to  help limit and resolve swelling.  It is recommended to use Ace wraps or TED hose until you are informed to stop.    When using Ace Wraps start the wrapping distally (farthest away from the body) and wrap proximally (closer to the body)   Example: If you had surgery on your leg or thing and you do not have a splint on, start the ace wrap at the toes and work your way up to the thigh        If you had surgery on your upper extremity and do not have a splint on, start the ace wrap at your fingers and work your way up to the upper arm  IF YOU ARE IN A CAM BOOT (BLACK BOOT)  You may remove boot periodically. Perform daily dressing changes as noted below.  Wash the liner of the boot regularly and wear a sock when wearing the boot. It is recommended that you sleep in the boot until told otherwise   CALL THE OFFICE WITH ANY QUESTIONS OR CONCERNS: [phone]   VISIT OUR WEBSITE FOR ADDITIONAL INFORMATION: [email or web address]     Discharge Wound Care Instructions  Do NOT apply any ointments, solutions or lotions to pin sites or surgical wounds.  These prevent needed drainage and even though solutions like hydrogen peroxide kill bacteria, they also damage cells lining the pin sites that help fight infection.  Applying lotions or ointments can keep the wounds moist and can cause them to breakdown and open up as well. This can increase the risk for infection. When in doubt call the office.  If any drainage is noted, use one layer of adaptic, then gauze, Kerlix, and an ace wrap.  Once the incision is completely dry and without drainage, it may be left open to air out.  Showering may begin 36-48 hours later.  Cleaning gently with soap and water.

## 2021-12-25 NOTE — Evaluation (Addendum)
Physical Therapy Evaluation Patient Details Name: [name] MRN: [id number] DOB: 06-Oct-1961 Today's Date: 12/25/2021  History of Present Illness  Pt is a 61 y.o. female admitted 12/23/21 with L open tibial fx while standing in front of her microwave; noted hypotension in ED. S/p L tibial IMN and I&D on 2/2. PMH includes OA, fibromyalgia, chronic back pain, R knee lateral meniscus tear, bilateral THA (2016), L TKA (2007), R TKA (08/2021).   Clinical Impression  Pt presents with an overall decrease in functional mobility secondary to above. PTA, pt independent, lives with spouse, does not work. Educ on LLE precautions, positioning, edema control, DVT prevention, therex, and importance of mobility. Today, pt able to initiate transfer and gait training with RW and min guard; good ability to maintain LLE TDWB precautions, although limited by c/o significant pain. Pt denies lightheadedness with upright mobility (see BP values below). Pt would benefit from continued acute PT services to maximize functional mobility and independence prior to d/c home.     Orthostatic BPs Supine 102/55  Sitting 111/51  Standing 108/73     Recommendations for follow up therapy are one component of a multi-disciplinary discharge planning process, led by the attending physician.  Recommendations may be updated based on patient status, additional functional criteria and insurance authorization.  Follow Up Recommendations No PT follow up    Assistance Recommended at Discharge Intermittent Supervision/Assistance  Patient can return home with the following  A little help with bathing/dressing/bathroom;Assistance with cooking/housework;Assist for transportation;Help with stairs or ramp for entrance    Equipment Recommendations BSC/3in1  Recommendations for Other Services       Functional Status Assessment Patient has had a recent decline in their functional status and demonstrates the ability to make significant  improvements in function in a reasonable and predictable amount of time.     Precautions / Restrictions Precautions Precautions: Fall;Other (comment) Precaution Comments: watch BP (has been soft) Restrictions Weight Bearing Restrictions: Yes LLE Weight Bearing: Touchdown weight bearing Other Position/Activity Restrictions: in cam boot      Mobility  Bed Mobility Overal bed mobility: Modified Independent             General bed mobility comments: hob elevated    Transfers Overall transfer level: Needs assistance Equipment used: Rolling walker (2 wheels) Transfers: Sit to/from Stand, Bed to chair/wheelchair/BSC Sit to Stand: Min guard   Step pivot transfers: Min guard       General transfer comment: Multiple sit<>stands from bed and recliner to RW with min guard for stability; pt with good carryover of educ for sequencing and hand placement    Ambulation/Gait               General Gait Details: deferred further ambulation secondary to pain  Stairs Stairs:  (demonstrated technique for ascend/descending 1 step with RW; pt reports no concern with husband's assist)          Wheelchair Mobility    Modified Rankin (Stroke Patients Only)       Balance Overall balance assessment: Needs assistance   Sitting balance-Leahy Scale: Good       Standing balance-Leahy Scale: Poor Standing balance comment: reliant on UE support; good ability to maintain LLE TDWB with walker                             Pertinent Vitals/Pain Pain Assessment Pain Assessment: Faces Faces Pain Scale: Hurts even more Pain Location: LLE Pain Descriptors /  Indicators: Moaning, Guarding Pain Intervention(s): Monitored during session, Repositioned, RN gave pain meds during session    [place] expects to be discharged to:: Private residence Living Arrangements: Spouse/significant other Available Help at Discharge: Family Type of Home: House Home  Access: Stairs to enter Entrance Stairs-Rails: None Entrance Stairs-Number of Steps: 1   Home Layout: One level Home Equipment: [place] - single point;Grab bars - tub/shower;Rolling Walker (2 wheels) Additional Comments: husband works    Prior Function Prior Level of Function : Independent/Modified Independent             Mobility Comments: No longer works, on disability. Drives       Hand Dominance   Dominant Hand: Right    Extremity/Trunk Assessment   Upper Extremity Assessment Upper Extremity Assessment: Overall WFL for tasks assessed    Lower Extremity Assessment Lower Extremity Assessment: LLE deficits/detail LLE Deficits / Details: s/p L tibial IMN; hip and knee strength at least 3/5 with expected post-op pain and weakness       Communication   Communication: No difficulties  Cognition Arousal/Alertness: Awake/alert Behavior During Therapy: WFL for tasks assessed/performed Overall Cognitive Status: Within Functional Limits for tasks assessed                                          General Comments General comments (skin integrity, edema, etc.): increased time educ re: precautions, positioning, edema, DVT prevention, activity recommendations, DME options (pt plans to stick with RW for now, but may consider crutches in the future), fall risk reduction (BSC for nightly bathroom trips if needed). Pt declines follow-up PT services.    Exercises Other Exercises Other Exercises: LAQ, quad sets; pt unable to perform SLR   Assessment/Plan    PT Assessment Patient needs continued PT services  PT Problem List Decreased strength;Decreased activity tolerance;Decreased balance;Decreased mobility;Decreased knowledge of use of DME;Decreased knowledge of precautions       PT Treatment Interventions DME instruction;Gait training;Stair training;Functional mobility training;Therapeutic activities;Therapeutic exercise;Balance training;Patient/family education     PT Goals (Current goals can be found in the Care Plan section)  Acute Rehab PT Goals Patient Stated Goal: return home, decreased pain PT Goal Formulation: With patient Time For Goal Achievement: 01/08/22 Potential to Achieve Goals: Good    Frequency Min 5X/week     Co-evaluation               AM-PAC PT "6 Clicks" Mobility  Outcome Measure Help needed turning from your back to your side while in a flat bed without using bedrails?: None Help needed moving from lying on your back to sitting on the side of a flat bed without using bedrails?: None Help needed moving to and from a bed to a chair (including a wheelchair)?: A Little Help needed standing up from a chair using your arms (e.g., wheelchair or bedside chair)?: A Little Help needed to walk in hospital room?: A Little Help needed climbing 3-5 steps with a railing? : A Little 6 Click Score: 20    End of Session Equipment Utilized During Treatment: Gait belt Activity Tolerance: Patient tolerated treatment well Patient left: in chair;with call bell/phone within reach;with chair alarm set Nurse Communication: Mobility status PT Visit Diagnosis: Other abnormalities of gait and mobility (R26.89);Pain Pain - Right/Left: Left Pain - part of body: Leg;Ankle and joints of foot    Time: 2025-4270 PT Time Calculation (min) (  ACUTE ONLY): 14 min   Charges:   PT Evaluation $PT Eval Low Complexity: [place], PT, DPT Acute Rehabilitation Services  Pager [phone] Office [place] 12/25/2021, 10:29 AM

## 2021-12-25 NOTE — Evaluation (Signed)
Occupational Therapy Evaluation Patient Details Name: [name] MRN: [id number] DOB: 17-May-1961 Today's Date: 12/25/2021   History of Present Illness Pt is a 61 y.o. female admitted 12/23/21 with L open tibial fx while standing in front of her microwave; noted hypotension in ED. S/p L tibial IMN and I&D on 2/2. PMH includes OA, fibromyalgia, chronic back pain, R knee lateral meniscus tear, bilateral THA (2016), L TKA (2007), R TKA (08/2021).   Clinical Impression   Pt was independent prior to admission, drives, but does not work. Pt presents with L LE pain and impaired balance, but was able to mobilize OOB with min guard assist and requires set up to moderate assistance for ADL. She lives with her supportive husband who works during the day. Pt likely to progress well.      Recommendations for follow up therapy are one component of a multi-disciplinary discharge planning process, led by the attending physician.  Recommendations may be updated based on patient status, additional functional criteria and insurance authorization.   Follow Up Recommendations  Home health OT    Assistance Recommended at Discharge Intermittent Supervision/Assistance  Patient can return home with the following Assist for transportation;Help with stairs or ramp for entrance;Assistance with cooking/housework    Functional Status Assessment  Patient has had a recent decline in their functional status and demonstrates the ability to make significant improvements in function in a reasonable and predictable amount of time.  Equipment Recommendations  BSC/3in1    Recommendations for Other Services       Precautions / Restrictions Precautions Precautions: Fall;Other (comment) Precaution Comments: watch BP (has been soft) Required Braces or Orthoses: Other Brace Other Brace: L CAM boot Restrictions Weight Bearing Restrictions: Yes LLE Weight Bearing: Touchdown weight bearing Other Position/Activity  Restrictions: in cam boot      Mobility Bed Mobility Overal bed mobility: Modified Independent             General bed mobility comments: hob elevated    Transfers Overall transfer level: Needs assistance Equipment used: Rolling walker (2 wheels) Transfers: Sit to/from Stand, Bed to chair/wheelchair/BSC Sit to Stand: Min guard     Step pivot transfers: Min guard     General transfer comment: Multiple sit<>stands from bed and recliner to RW with min guard for stability; pt with good carryover of educ for sequencing and hand placement      Balance Overall balance assessment: Needs assistance   Sitting balance-Leahy Scale: Good       Standing balance-Leahy Scale: Poor Standing balance comment: reliant on UE support; good ability to maintain LLE TDWB with walker                           ADL either performed or assessed with clinical judgement   ADL Overall ADL's : Needs assistance/impaired Eating/Feeding: Independent   Grooming: Set up;Sitting   Upper Body Bathing: Set up;Sitting   Lower Body Bathing: Moderate assistance;Sit to/from stand   Upper Body Dressing : Set up;Sitting   Lower Body Dressing: Moderate assistance;Sit to/from stand   Toilet Transfer: Min guard;Stand-pivot;Regular Toilet;BSC/3in1   Toileting- Water quality scientist and Hygiene: Set up;Sitting/lateral lean       Functional mobility during ADLs: Min guard;Rolling walker (2 wheels)       Vision Ability to See in Adequate Light: 0 Adequate Patient Visual Report: No change from baseline       Perception     Praxis  Pertinent Vitals/Pain Pain Assessment Pain Assessment: Faces Faces Pain Scale: Hurts even more Pain Location: LLE Pain Descriptors / Indicators: Moaning, Guarding Pain Intervention(s): Monitored during session, RN gave pain meds during session, Repositioned     Hand Dominance Right   Extremity/Trunk Assessment Upper Extremity Assessment Upper  Extremity Assessment: Overall WFL for tasks assessed   Lower Extremity Assessment Lower Extremity Assessment: Defer to PT evaluation LLE Deficits / Details: s/p L tibial IMN; hip and knee strength at least 3/5 with expected post-op pain and weakness   Cervical / Trunk Assessment Cervical / Trunk Assessment: Normal   Communication Communication Communication: No difficulties   Cognition Arousal/Alertness: Awake/alert Behavior During Therapy: WFL for tasks assessed/performed Overall Cognitive Status: Within Functional Limits for tasks assessed                                       General Comments  increased time educ re: precautions, positioning, edema, DVT prevention, activity recommendations, DME options (pt plans to stick with RW for now, but may consider crutches in the future), fall risk reduction (BSC for nightly bathroom trips if needed). Pt declines follow-up PT services.    Exercises     Shoulder Instructions      Home Living Family/patient expects to be discharged to:: Private residence Living Arrangements: Spouse/significant other Available Help at Discharge: Family;Available PRN/intermittently Type of Home: House Home Access: Stairs to enter [employer] of Steps: 1 Entrance Stairs-Rails: None Home Layout: One level     Bathroom Shower/Tub: Teacher, early years/pre: Standard Bathroom Accessibility: Yes   Home Equipment: [place]ane - single point;Grab bars - tub/shower;Rolling Walker (2 wheels)   Additional Comments: husband works      Prior Functioning/Environment Prior Level of Function : Independent/Modified Independent             Mobility Comments: No longer works, on disability. Drives ADLs Comments: independent        OT Problem List: Impaired balance (sitting and/or standing);Decreased knowledge of use of DME or AE;Pain      OT Treatment/Interventions: Self-care/ADL training;DME and/or AE  instruction;Patient/family education;Balance training;Therapeutic activities    OT Goals(Current goals can be found in the care plan section) Acute Rehab OT Goals OT Goal Formulation: With patient Time For Goal Achievement: 01/08/22 Potential to Achieve Goals: Good ADL Goals Pt Will Perform Grooming: with supervision;standing Pt Will Perform Lower Body Bathing: with supervision;sit to/from stand Pt Will Perform Lower Body Dressing: with supervision;sit to/from stand Pt Will Transfer to Toilet: with supervision;ambulating;bedside commode Pt Will Perform Toileting - Clothing Manipulation and hygiene: with supervision;sit to/from stand  OT Frequency: Min 2X/week    Co-evaluation              AM-PAC OT "6 Clicks" Daily Activity     Outcome Measure Help from another person eating meals?: None Help from another person taking care of personal grooming?: A Little Help from another person toileting, which includes using toliet, bedpan, or urinal?: A Little Help from another person bathing (including washing, rinsing, drying)?: A Lot Help from another person to put on and taking off regular upper body clothing?: A Little Help from another person to put on and taking off regular lower body clothing?: A Lot 6 Click Score: 17   End of Session Equipment Utilized During Treatment: Gait belt;Rolling walker (2 wheels);Other (comment) (CAM boot) Nurse Communication: Mobility status;Other (comment) (no hypotension)  Activity Tolerance: Patient tolerated treatment well Patient left: in chair;with call bell/phone within reach;with chair alarm set  OT Visit Diagnosis: Unsteadiness on feet (R26.81);Other abnormalities of gait and mobility (R26.89);Pain                Time: 4621-9471 OT Time Calculation (min): 16 min Charges:  OT General Charges $OT Visit: 1 Visit OT Evaluation $OT Eval Low Complexity: 1 Low  [name], OTR/L Acute Rehabilitation Services Pager: [phone] Office:  [phone]   [name] 12/25/2021, 10:47 AM

## 2021-12-25 NOTE — Progress Notes (Signed)
Minimal bleeding noted on LLE.  Pt states some bleeding is new.  ACE bandage in place.  BP 86/54.  MD notified.  Will continue to monitor.

## 2021-12-25 NOTE — Progress Notes (Signed)
Orthopaedic Trauma Progress Note  SUBJECTIVE: Doing ok today, pain currently about 7/10. Did well with therapies this AM. Leg feels better sitting up in chair than it did in bed. No chest pain. No SOB. BP has been low, patient notes this is an issue at baseline. Having some intermittent nausea, no vomiting. Zofran helping.  No other complaints. Wants to try to go home tomorrow. Patient seen by [facility], PA-C).  OBJECTIVE:  Vitals:   12/25/21 0758 12/25/21 0823  BP: (!) 86/54 (!) 97/42  Pulse: 78 77  Resp:  17  Temp:  97.8 F (36.6 C)  SpO2: 91% 93%    General: Sitting up in bedside chair, NAD Respiratory: No increased work of breathing.  LLE: Dressing with moderate dried blood on ace wrap. Tender about knee. CAM boot in place. Able to wiggle toes. Endorses sensation to light touch. Neurovascularly intact  IMAGING: Stable post op imaging.   LABS:  Results for orders placed or performed during the hospital encounter of 12/23/21 (from the past 24 hour(s))  Basic metabolic panel     Status: Abnormal   Collection Time: 12/25/21  2:48 AM  Result Value Ref Range   Sodium 134 (L) 135 - 145 mmol/L   Potassium 4.0 3.5 - 5.1 mmol/L   Chloride 101 98 - 111 mmol/L   CO2 27 22 - 32 mmol/L   Glucose, Bld 128 (H) 70 - 99 mg/dL   BUN 6 6 - 20 mg/dL   Creatinine, Ser 0.64 0.44 - 1.00 mg/dL   Calcium 8.2 (L) 8.9 - 10.3 mg/dL   GFR, Estimated >60 >60 mL/min   Anion gap 6 5 - 15  CBC     Status: Abnormal   Collection Time: 12/25/21  2:48 AM  Result Value Ref Range   WBC 4.7 4.0 - 10.5 K/uL   RBC 2.44 (L) 3.87 - 5.11 MIL/uL   Hemoglobin 7.5 (L) 12.0 - 15.0 g/dL   HCT 21.9 (L) 36.0 - 46.0 %   MCV 89.8 80.0 - 100.0 fL   MCH 30.7 26.0 - 34.0 pg   MCHC 34.2 30.0 - 36.0 g/dL   RDW 14.2 11.5 - 15.5 %   Platelets 132 (L) 150 - 400 K/uL   nRBC 0.0 0.0 - 0.2 %  VITAMIN D 25 Hydroxy (Vit-D Deficiency, Fractures)     Status: Abnormal   Collection Time: 12/25/21   2:48 AM  Result Value Ref Range   Vit D, 25-Hydroxy 28.58 (L) 30 - 100 ng/mL    ASSESSMENT: [name] is a 61 y.o. female, 1 Day Post-Op s/p INTRAMEDULLARY NAIL LEFT TIBIA IRRIGATION AND DEBRIDEMENT LEFT LOWER EXTREMITY  CV/Blood loss: Acute blood loss anemia, Hgb 7.5 this AM. BP low  PLAN: Weightbearing: TDWB LLE ROM: Ok to come out fo boot at rest for ankle ROM as tolerated Incisional and dressing care: Reinforce as needed. Plan to remove 12/26/21 and leave open to air if no drainage  Showering: Ok to begin getting incisions wet on 12/27/21 if no drainage form incisions Orthopedic device(s): CAM boot LLE Pain management:  1. Tylenol 1000 mg q 6 hours scheduled 2. Robaxin 500 mg q 6 hours PRN 3. Oxycodone 5-10 mg q 4 hours PRN 4. Dilaudid 0.5-1 mg q 3 hours PRN VTE prophylaxis: Lovenox, SCDs ID: Ancef 2gm post op completed this AM Foley/Lines: No foley, KVO IVFs Impediments to Fracture Healing: Vit D level 28, started on D3 supplementation Dispo: PT/OT eval today. Continue  to monitor CBC, will start Lovenox once Hgb stable. 1L fluid bolus this AM.  Plan to d/c home on ASA 325 mg BID x 30 days for DVT prophylaxis. Will reach out to pain management to discuss discharge narcotics Follow - up plan: 2 weeks after d/c for repeat imaging and wound check  Contact information:  Katha Hamming MD, Rushie Nyhan PA-C. After hours and holidays please check [email or web address] for group call information for [facility]   Gwinda Passe, PA-C [phone] (office) [email or web address]

## 2021-12-26 LAB — HEMOGLOBIN AND HEMATOCRIT, BLOOD
HCT: 26.5 % — ABNORMAL LOW (ref 36.0–46.0)
Hemoglobin: 9.2 g/dL — ABNORMAL LOW (ref 12.0–15.0)

## 2021-12-26 LAB — CBC
HCT: 20.4 % — ABNORMAL LOW (ref 36.0–46.0)
Hemoglobin: 6.8 g/dL — CL (ref 12.0–15.0)
MCH: 30.9 pg (ref 26.0–34.0)
MCHC: 33.3 g/dL (ref 30.0–36.0)
MCV: 92.7 fL (ref 80.0–100.0)
Platelets: 122 10*3/uL — ABNORMAL LOW (ref 150–400)
RBC: 2.2 MIL/uL — ABNORMAL LOW (ref 3.87–5.11)
RDW: 14.6 % (ref 11.5–15.5)
WBC: 4.5 10*3/uL (ref 4.0–10.5)
nRBC: 0 % (ref 0.0–0.2)

## 2021-12-26 LAB — BASIC METABOLIC PANEL
Anion gap: 6 (ref 5–15)
BUN: 5 mg/dL — ABNORMAL LOW (ref 6–20)
CO2: 26 mmol/L (ref 22–32)
Calcium: 8.5 mg/dL — ABNORMAL LOW (ref 8.9–10.3)
Chloride: 104 mmol/L (ref 98–111)
Creatinine, Ser: 0.63 mg/dL (ref 0.44–1.00)
GFR, Estimated: >60 mL/min (ref >60)
Glucose, Bld: 109 mg/dL — ABNORMAL HIGH (ref 70–99)
Potassium: 4.2 mmol/L (ref 3.5–5.1)
Sodium: 136 mmol/L (ref 135–145)

## 2021-12-26 LAB — PREPARE RBC (CROSSMATCH)

## 2021-12-26 MED ORDER — SODIUM CHLORIDE 0.9% IV SOLUTION: Freq: Once | INTRAVENOUS | Status: AC

## 2021-12-26 MED ORDER — ENOXAPARIN SODIUM 40 MG/0.4ML IJ SOSY: 40.0000 mg | PREFILLED_SYRINGE | INTRAMUSCULAR | Status: DC

## 2021-12-26 MED ORDER — NICOTINE 21 MG/24HR TD PT24
21.0000 mg | MEDICATED_PATCH | Freq: Every day | TRANSDERMAL | Status: DC
  Administered 2021-12-26 – 2021-12-27 (×2): 21 mg via TRANSDERMAL
  Filled 2021-12-26 (×2): qty 1

## 2021-12-26 NOTE — Progress Notes (Signed)
Orthopaedic Progress Note  SUBJECTIVE: Doing ok today, happy with how her pain management schedule is going. Feels better than before surgery. Did well with therapies yesterday. No chest pain. No SOB. Hgb 6.8 this AM. No other complaints. Patient seen by [facility], PA-C).  OBJECTIVE:  Vitals:   12/26/21 0915 12/26/21 1000  BP: 129/63 115/63  Pulse: 87 89  Resp: 16 15  Temp: 98.8 F (37.1 C) 98.9 F (37.2 C)  SpO2: 100% 94%    General: Sitting up in bedside chair, NAD Respiratory: No increased work of breathing.  LLE: Dressing with moderate dried blood on ace wrap. Dressings removed today. Incisions CDI. Did have some bleeding from distal anterior incision. New dressings placed. Compartments swollen but compressible. Able to wiggle toes. Endorses sensation to light touch. Neurovascularly intact  IMAGING: Stable post op imaging.   LABS:  Results for orders placed or performed during the hospital encounter of 12/23/21 (from the past 24 hour(s))  Basic metabolic panel     Status: Abnormal   Collection Time: 12/26/21  5:56 AM  Result Value Ref Range   Sodium 136 135 - 145 mmol/L   Potassium 4.2 3.5 - 5.1 mmol/L   Chloride 104 98 - 111 mmol/L   CO2 26 22 - 32 mmol/L   Glucose, Bld 109 (H) 70 - 99 mg/dL   BUN 5 (L) 6 - 20 mg/dL   Creatinine, Ser 0.63 0.44 - 1.00 mg/dL   Calcium 8.5 (L) 8.9 - 10.3 mg/dL   GFR, Estimated >60 >60 mL/min   Anion gap 6 5 - 15  CBC     Status: Abnormal   Collection Time: 12/26/21  5:56 AM  Result Value Ref Range   WBC 4.5 4.0 - 10.5 K/uL   RBC 2.20 (L) 3.87 - 5.11 MIL/uL   Hemoglobin 6.8 (LL) 12.0 - 15.0 g/dL   HCT 20.4 (L) 36.0 - 46.0 %   MCV 92.7 80.0 - 100.0 fL   MCH 30.9 26.0 - 34.0 pg   MCHC 33.3 30.0 - 36.0 g/dL   RDW 14.6 11.5 - 15.5 %   Platelets 122 (L) 150 - 400 K/uL   nRBC 0.0 0.0 - 0.2 %  Prepare RBC (crossmatch)     Status: None   Collection Time: 12/26/21  7:30 AM  Result Value Ref Range   Order  Confirmation      ORDER PROCESSED BY BLOOD BANK Performed at [facility], [address] [place], [place] [address]     ASSESSMENT: [name] is a 61 y.o. female, 2 Days Post-Op s/p INTRAMEDULLARY NAIL LEFT TIBIA IRRIGATION AND DEBRIDEMENT LEFT LOWER EXTREMITY  CV/Blood loss: Acute blood loss anemia, Hgb 6.8 this AM. 2 units pRBC ordered this morning. Will continue to monitor.   PLAN: Weightbearing: TDWB LLE ROM: Ok to come out fo boot at rest for ankle ROM as tolerated Incisional and dressing care: Reinforce as needed. New dressings placed on 2/4.  Showering: Hold for now. Mild drainage from distal anterior incision. Orthopedic device(s): CAM boot LLE Pain management:  1. Tylenol 1000 mg q 6 hours scheduled 2. Robaxin 500 mg q 6 hours PRN 3. Oxycodone 5-10 mg q 4 hours PRN 4. Dilaudid 0.5-1 mg q 3 hours PRN VTE prophylaxis: Lovenox (on hold), SCDs ID: Ancef 2gm post op completed this AM Foley/Lines: No foley, KVO IVFs Impediments to Fracture Healing: Vit D level 28, started on D3 supplementation Dispo: PT/OT recommending home health. Continue  to monitor CBC, will start Lovenox once Hgb stable. Hgb 6.8 this morning. Received pRBCs. Will continue to monitor patient's swelling and Hgb. Hopefully discharge tomorrow if doing well and Hgb stabilizes. Plan to d/c home on ASA 325 mg BID x 30 days for DVT prophylaxis. Will reach out to pain management to discuss discharge narcotics Follow - up plan: 2 weeks after d/c for repeat imaging and wound check  Contact information: After hours and holidays please check [email or web address] for group call information for [facility]  Noemi Chapel, PA-C 12/26/2021

## 2021-12-26 NOTE — Progress Notes (Addendum)
Date and time results received: 12/26/21 7:25  Test: hemoglobin Critical Value: 6.8  Name of Provider Notified: Noemi Chapel  New Orders Received

## 2021-12-26 NOTE — Progress Notes (Signed)
Leg elevated on blankets and ice packs applied.

## 2021-12-26 NOTE — Progress Notes (Signed)
Mobility Specialist Criteria Algorithm Info.  12/26/21 1045  Pain Assessment  Pain Assessment Faces  Pain Score 6  Faces Pain Scale 4  Pain Location LLE  Pain Descriptors / Indicators Grimacing;Moaning  Pain Intervention(s) Limited activity within patient's tolerance;Premedicated before session;Monitored during session  Mobility  Activity Ambulated with assistance in room; Ambulated to bathroom  Range of Motion/Exercises Active  Level of Assistance Standby assist, set-up cues, supervision of patient - no hands on  Assistive Device Front wheel walker  LLE Weight Bearing TWB  Distance Ambulated (ft) 40 ft  Activity Response Tolerated well   Patient progressing well with mobility. Received in recliner chair eager and motivated to participate. Was able to stand from recliner w/supervision + cues for hand/foot placement. Ambulated short distance in room min guard to supervision with good adherence to TWB on LLE.  Requested assistance to restroom then returned to recliner chair without incident. Was left in chair with all needs met, call bell in reach. RN and family present.   12/26/2021 11:36 AM  [place] Dijon Kohlman, [place], [place]  HENID:[phone] Office: [phone]

## 2021-12-26 NOTE — Progress Notes (Signed)
Occupational Therapy Treatment Patient Details Name: [name] MRN: [id number] DOB: 05-24-1961 Today's Date: 12/26/2021   History of present illness Pt is a 61 y.o. female admitted 12/23/21 with L open tibial fx while standing in front of her microwave; noted hypotension in ED. S/p L tibial IMN and I&D on 2/2. PMH includes OA, fibromyalgia, chronic back pain, R knee lateral meniscus tear, bilateral THA (2016), L TKA (2007), R TKA (08/2021).   OT comments  Patient continues to make good progress with OT treatment. Patient performed toilet transfers and hygiene and grooming tasks seated.  Patient limited by pain and WB status. Patient has potential for continued gains and discharge location continues to be appropriate. Acute OT to continue to follow.    Recommendations for follow up therapy are one component of a multi-disciplinary discharge planning process, led by the attending physician.  Recommendations may be updated based on patient status, additional functional criteria and insurance authorization.    Follow Up Recommendations  Home health OT    Assistance Recommended at Discharge Intermittent Supervision/Assistance  Patient can return home with the following  Assist for transportation;Help with stairs or ramp for entrance;Assistance with cooking/housework   Equipment Recommendations  BSC/3in1    Recommendations for Other Services      Precautions / Restrictions Precautions Precautions: Fall;Other (comment) Precaution Comments: watch BP (has been soft) Required Braces or Orthoses: Other Brace Other Brace: L CAM boot Restrictions Weight Bearing Restrictions: Yes LLE Weight Bearing: Touchdown weight bearing Other Position/Activity Restrictions: in cam boot       Mobility Bed Mobility Overal bed mobility: Modified Independent             General bed mobility comments: seated in recliner    Transfers Overall transfer level: Needs assistance Equipment used:  Rolling walker (2 wheels) Transfers: Sit to/from Stand, Bed to chair/wheelchair/BSC Sit to Stand: Min guard     Step pivot transfers: Min guard     General transfer comment: performed toilet and recliner transfers     Balance Overall balance assessment: Needs assistance   Sitting balance-Leahy Scale: Good     Standing balance support: Bilateral upper extremity supported Standing balance-Leahy Scale: Poor Standing balance comment: reliant on UE support with walker                           ADL either performed or assessed with clinical judgement   ADL Overall ADL's : Needs assistance/impaired     Grooming: Set up;Sitting Grooming Details (indicate cue type and reason): performed seated in recliner                 Toilet Transfer: Min Sales executive Details (indicate cue type and reason): ambulated to bathroom with RW Toileting- Clothing Manipulation and Hygiene: Set up;Sitting/lateral lean Toileting - Clothing Manipulation Details (indicate cue type and reason): able to perform seated     Functional mobility during ADLs: Min guard;Rolling walker (2 wheels) General ADL Comments: Patient had complaints of pain at LLE and performed limited standing and mobility    Extremity/Trunk Assessment              Vision       Perception     Praxis      Cognition Arousal/Alertness: Awake/alert Behavior During Therapy: WFL for tasks assessed/performed Overall Cognitive Status: Within Functional Limits for tasks assessed  General Comments: aware of WB precautions        Exercises      Shoulder Instructions       General Comments      Pertinent Vitals/ Pain       Pain Assessment Pain Assessment: Faces Faces Pain Scale: Hurts even more Pain Location: LLE Pain Descriptors / Indicators: Moaning, Guarding Pain Intervention(s): Monitored during session,  Repositioned, Premedicated before session  Home Living                                          Prior Functioning/Environment              Frequency  Min 2X/week        Progress Toward Goals  OT Goals(current goals can now be found in the care plan section)  Progress towards OT goals: Progressing toward goals  Acute Rehab OT Goals OT Goal Formulation: With patient Time For Goal Achievement: 01/08/22 Potential to Achieve Goals: Good ADL Goals Pt Will Perform Grooming: with supervision;standing Pt Will Perform Lower Body Bathing: with supervision;sit to/from stand Pt Will Perform Lower Body Dressing: with supervision;sit to/from stand Pt Will Transfer to Toilet: with supervision;ambulating;bedside commode Pt Will Perform Toileting - Clothing Manipulation and hygiene: with supervision;sit to/from stand  Plan Discharge plan remains appropriate    Co-evaluation                 AM-PAC OT "6 Clicks" Daily Activity     Outcome Measure   Help from another person eating meals?: None Help from another person taking care of personal grooming?: A Little Help from another person toileting, which includes using toliet, bedpan, or urinal?: A Little Help from another person bathing (including washing, rinsing, drying)?: A Lot Help from another person to put on and taking off regular upper body clothing?: A Little Help from another person to put on and taking off regular lower body clothing?: A Lot 6 Click Score: 17    End of Session Equipment Utilized During Treatment: Gait belt;Rolling walker (2 wheels)  OT Visit Diagnosis: Unsteadiness on feet (R26.81);Other abnormalities of gait and mobility (R26.89);Pain   Activity Tolerance Patient tolerated treatment well   Patient Left in chair;with call bell/phone within reach;with chair alarm set   Nurse Communication Mobility status        Time: 1610-9604 OT Time Calculation (min): 29 min  Charges:  OT General Charges $OT Visit: 1 Visit OT Treatments $Self Care/Home Management : 23-37 mins  Lodema Hong, [place]  Pager [phone] Office [place] 12/26/2021, 10:30 AM

## 2021-12-27 LAB — BASIC METABOLIC PANEL
Anion gap: 7 (ref 5–15)
BUN: 7 mg/dL (ref 6–20)
CO2: 28 mmol/L (ref 22–32)
Calcium: 8.9 mg/dL (ref 8.9–10.3)
Chloride: 104 mmol/L (ref 98–111)
Creatinine, Ser: 0.62 mg/dL (ref 0.44–1.00)
GFR, Estimated: >60 mL/min (ref >60)
Glucose, Bld: 101 mg/dL — ABNORMAL HIGH (ref 70–99)
Potassium: 3.8 mmol/L (ref 3.5–5.1)
Sodium: 139 mmol/L (ref 135–145)

## 2021-12-27 LAB — BPAM RBC
Blood Product Expiration Date: 202302082359
Blood Product Expiration Date: 202302082359
ISSUE DATE / TIME: 202302040748
ISSUE DATE / TIME: 202302040937
Unit Type and Rh: 9500
Unit Type and Rh: 9500

## 2021-12-27 LAB — TYPE AND SCREEN
ABO/RH(D): O NEG
Antibody Screen: NEGATIVE
Unit division: 0
Unit division: 0

## 2021-12-27 LAB — CBC
HCT: 26.4 % — ABNORMAL LOW (ref 36.0–46.0)
Hemoglobin: 9 g/dL — ABNORMAL LOW (ref 12.0–15.0)
MCH: 31.3 pg (ref 26.0–34.0)
MCHC: 34.1 g/dL (ref 30.0–36.0)
MCV: 91.7 fL (ref 80.0–100.0)
Platelets: UNDETERMINED 10*3/uL (ref 150–400)
RBC: 2.88 MIL/uL — ABNORMAL LOW (ref 3.87–5.11)
RDW: 15.5 % (ref 11.5–15.5)
WBC: 4.5 10*3/uL (ref 4.0–10.5)
nRBC: 0 % (ref 0.0–0.2)

## 2021-12-27 NOTE — Plan of Care (Signed)

## 2021-12-27 NOTE — Progress Notes (Addendum)
Subjective: 3 Days Post-Op s/p Procedure(s): INTRAMEDULLARY (IM) NAIL TIBIAL IRRIGATION AND DEBRIDEMENT EXTREMITY   Patient is alert, oriented. Sitting up in bed.  Patient reports pain as well controlled on current regimen. Denies chest pain, SOB, Calf pain. No nausea/vomiting. No other complaints. Ready to go home. Denies dizziness or lightheadedness. States she feels much better after receiving transfusion yesterday.   Objective:  PE: VITALS:   Vitals:   12/26/21 1129 12/26/21 1545 12/26/21 1959 12/27/21 0403  BP: 133/67 (!) 95/50 127/67 122/61  Pulse: 80 72 74 69  Resp: 16 17 16 14   Temp: 98.7 F (37.1 C) 98.1 F (36.7 C) 98.1 F (36.7 C) 98 F (36.7 C)  TempSrc: Axillary Oral    SpO2: 99% 95% 100% 98%  Weight:      Height:         General: Sitting up in bedside chair, NAD Respiratory: No increased work of breathing.  LLE: Dressings removed today due to scant drainage seen on dressings. Bloody drainage from proximal aspect of proximal incision. Fracture blister seen at medial knee. New dressings placed. Compartments swollen but compressible. Able to wiggle toes. Able to dorsiflex and plantarflex to ankle. Endorses sensation to light touch. + DP pulse.  LABS  Results for orders placed or performed during the hospital encounter of 12/23/21 (from the past 24 hour(s))  Hemoglobin and hematocrit, blood     Status: Abnormal   Collection Time: 12/26/21  1:22 PM  Result Value Ref Range   Hemoglobin 9.2 (L) 12.0 - 15.0 g/dL   HCT 26.5 (L) 36.0 - 09.3 %  Basic metabolic panel     Status: Abnormal   Collection Time: 12/27/21  1:53 AM  Result Value Ref Range   Sodium 139 135 - 145 mmol/L   Potassium 3.8 3.5 - 5.1 mmol/L   Chloride 104 98 - 111 mmol/L   CO2 28 22 - 32 mmol/L   Glucose, Bld 101 (H) 70 - 99 mg/dL   BUN 7 6 - 20 mg/dL   Creatinine, Ser 0.62 0.44 - 1.00 mg/dL   Calcium 8.9 8.9 - 10.3 mg/dL   GFR, Estimated >60 >60 mL/min   Anion gap 7 5 - 15    No  results found.  ASSESSMENT: [name] is a 61 y.o. female, 3 Days Post-Op s/p INTRAMEDULLARY NAIL LEFT TIBIA IRRIGATION AND DEBRIDEMENT LEFT LOWER EXTREMITY   CV/Blood loss: Acute blood loss anemia, Hgb was 9.2 yesterday after transfusion. Hbg not yet back this morning, CBC ordered this morning.   PLAN: Weightbearing: TDWB LLE ROM: Ok to come out if boot at rest for ankle ROM as tolerated Incisional and dressing care: Reinforce as needed. New dressings placed on 2/4.  Showering: Hold for now. Mild drainage from distal anterior incision seen yesterday. Orthopedic device(s): CAM boot LLE Pain management:  1. Tylenol 1000 mg q 6 hours scheduled 2. Robaxin 500 mg q 6 hours PRN 3. Oxycodone 5-10 mg q 4 hours PRN 4. Dilaudid 0.5-1 mg q 3 hours PRN VTE prophylaxis: Lovenox (on hold), SCDs ID: Ancef 2gm post op completed Foley/Lines: No foley, KVO IVFs Impediments to Fracture Healing: Vit D level 28, started on D3 supplementation Dispo: PT/OT recommending home health. Continue to monitor CBC, awaiting CBC this morning to make sure it is stable.  Hopefully discharge this afternoon if Hgb stabilized. Plan to d/c home on ASA 325 mg BID x 30 days for DVT prophylaxis. Pain management plan is oxycodone q 4 hours  rather than her baseline hydrocodone. Patient has also messaged her pain management provider to discuss pain management moving forward. Follow - up plan: 2 weeks after d/c for repeat imaging and wound check with Dr. Doreatha Martin.  Addendum: Hbg resulted 9.0 this morning, patient asymptomatic and stable vitals. OK for discharge, but will contact TOC about d/c needs and PT/mobility specialist regarding need for further PT prior to discharge.   Contact information: After hours and holidays please check [email or web address] for group call information for [facility] [place] 12/27/2021, 8:07 AM

## 2021-12-27 NOTE — TOC Transition Note (Signed)
Transition of Care [facility]) - CM/SW Discharge Note   Patient Details  Name: [name] MRN: [id number] Date of Birth: 1961/05/27  Transition of Care [facility]) CM/SW Contact:  Bartholomew Crews, RN Phone Number: [phone] 12/27/2021, 10:59 AM   Clinical Narrative:     Notified of patient needs for outpatient OT and 3/1. Referrals placed.   Final next level of care: OP Rehab Barriers to Discharge: No Barriers Identified   Patient Goals and CMS Choice Patient states their goals for this hospitalization and ongoing recovery are:: return home CMS Medicare.gov Compare Post Acute Care list provided to:: Patient Choice offered to / list presented to : Patient  Discharge Placement                       Discharge Plan and Services                DME Arranged: 3-N-1 DME Agency: [employer] Date DME Agency Contacted: 12/27/21 Time DME Agency Contacted: [id number] Representative spoke with at DME Agency: [place]: NA [place] Agency: NA        Social Determinants of Health ([place]) Interventions     Readmission Risk Interventions No flowsheet data found.

## 2021-12-27 NOTE — TOC Transition Note (Signed)
Transition of Care [facility]) - CM/SW Discharge Note   Patient Details  Name: [name] MRN: [id number] Date of Birth: January 27, 1961  [facility]) CM/SW Contact:  Bartholomew Crews, RN Phone Number: [phone] 12/27/2021, 12:07 PM   Clinical Narrative:     Spoke with patient at the bedside to discuss post acute transition. Home with husband who will provide transportation home. Discussed HH choice for PT/OT - Referral accepted by [facility] - patient agreeable. 3/1 delivered to bedside by [employer]. No further TOC needs identified.   Final next level of care: [place] Barriers to Discharge: No Barriers Identified   Patient Goals and CMS Choice Patient states their goals for this hospitalization and ongoing recovery are:: return home CMS Medicare.gov Compare Post Acute Care list provided to:: Patient Choice offered to / list presented to : Patient  Discharge Placement                       Discharge Plan and Services                DME Arranged: 3-N-1 DME Agency: [employer] Date DME Agency Contacted: 12/27/21 Time DME Agency Contacted: [id number] Representative spoke with at DME Agency: [place]: PT, OT [place] Agency: [place] Date [place]: 12/27/21 Time [place]: 1207 Representative spoke with at [place]: [place] ([place]) Interventions     Readmission Risk Interventions No flowsheet data found.

## 2021-12-27 NOTE — Progress Notes (Signed)
PT signed off on pt and pt does not request additional follow-up and did not have additional questions.  MD notified.  Order to discharge pt home.    Discharge instructions/AVS given to patient and reviewed - education provided as needed.  Assistive devices delivered to room.  Pt advised to call PCP and/or come back to the hospital if there are any problems. Pt verbalized understanding.

## 2021-12-27 NOTE — Progress Notes (Signed)
Mobility Specialist Criteria Algorithm Info.   12/27/21 1100  Pain Assessment  Pain Assessment 0-10  Pain Score 7  Faces Pain Scale 6  Negative Vocalization 1  Pain Location LLE  Pain Descriptors / Indicators Discomfort;Grimacing  Pain Intervention(s) Monitored during session;Patient requesting pain meds-RN notified  Mobility  Activity Ambulated with assistance in hallway;Ambulated with assistance in room  Range of Motion/Exercises Active;All extremities  Level of Assistance Standby assist, set-up cues, supervision of patient - no hands on  Assistive Device Front wheel walker  LLE Weight Bearing TWB  Distance Ambulated (ft) 80 ft  Activity Response Tolerated well   Patient received in bed motivated to participate despite pain. Pt donned CAM boot independently. Independent bed mobility but needed minimal assistance to get LLE to EOB. Stood independently and ambulated "hopped" short distance in room and hallway. Would occasionally TWB during ambulation but for duration of the session pt was NWB, stated it helped managed the pain. Required standing rest breaks x2 secondary to increased strain and pressure on hands from heavy UE reliance on RW. Returned to room without incident or complaint. Was left in recliner chair with all needs met. RN present.   12/27/2021 11:47 AM  [name], [place], [place]  ZQJSI:[phone] Office: [phone]

## 2021-12-27 NOTE — Discharge Summary (Signed)
Discharge Summary  Patient ID: [name] MRN: [id number] DOB/AGE: [birth year] 60 y.o.  Admit date: 12/23/2021 Discharge date: 12/27/2021  Admission Diagnoses:  Tibia fracture  Discharge Diagnoses:  Principal Problem:   Tibia fracture Active Problems:   Tibia/fibula fracture   Past Medical History:  Diagnosis Date   Alcoholic pancreatitis 56/4332   01/15/13   Anemia    Reports being borderline   Anxiety    pt denies this dx on 12/24/21   Chronic lower back pain    Chronic pain    treated at [place]    Complex tear of lateral meniscus of right knee 09/18/2018   Depression    pt denies this dx on 12/24/21   Fibromyalgia    History of blood transfusion    "related to ORs"   Migraine    "monthly" (03/25/2015)   Osteoarthritis    "wrists, knees, back, neck, fingers,  q joint" (03/25/2015)   Primary localized osteoarthritis of right knee     Surgeries: Procedure(s): INTRAMEDULLARY (IM) NAIL TIBIAL IRRIGATION AND DEBRIDEMENT EXTREMITY on 12/24/2021   Consultants (if any): Treatment Team:  Shona Needles, MD  Discharged Condition: Improved  Hospital Course: [name] is an 60 y.o. female who was admitted 12/23/2021 with a diagnosis of Tibia fracture and went to the operating room on 12/24/2021 and underwent the above named procedures.    She was given perioperative antibiotics:  Anti-infectives (From admission, onward)    Start     Dose/Rate Route Frequency Ordered Stop   12/24/21 1615  ceFAZolin (ANCEF) IVPB 2g/100 mL premix        2 g 200 mL/hr over 30 Minutes Intravenous Every 8 hours 12/24/21 1525 12/25/21 1009   12/24/21 1312  vancomycin (VANCOCIN) powder  Status:  Discontinued          As needed 12/24/21 1312 12/24/21 1344   12/24/21 1015  ceFAZolin (ANCEF) IVPB 2g/100 mL premix  Status:  Discontinued        2 g 200 mL/hr over 30 Minutes Intravenous On call to O.R. 12/24/21 1011 12/24/21 1510   12/24/21 0800  ceFAZolin (ANCEF) IVPB 2g/100 mL premix   Status:  Discontinued        2 g 200 mL/hr over 30 Minutes Intravenous Every 8 hours 12/24/21 0112 12/24/21 1526   12/24/21 0115  ceFAZolin (ANCEF) IVPB 2g/100 mL premix  Status:  Discontinued        2 g 200 mL/hr over 30 Minutes Intravenous Every 8 hours 12/24/21 0112 12/24/21 0112   12/24/21 0015  ceFAZolin (ANCEF) IVPB 2g/100 mL premix        2 g 200 mL/hr over 30 Minutes Intravenous  Once 12/24/21 0002 12/24/21 0038     .  She was given sequential compression devices, early ambulation for DVT prophylaxis. Lovenox was held due to acute blood loss anemia and 2 units PRBC transfusion was given 12/26/21 due to Hbg <7. Hbg remained stable at 9.0 today after transfusion, patient was asymptomatic and patient will be allowed to discharge home.    Recent vital signs:  Vitals:   12/27/21 0403 12/27/21 0831  BP: 122/61 124/64  Pulse: 69 77  Resp: 14 18  Temp: 98 F (36.7 C) 97.7 F (36.5 C)  SpO2: 98% 100%    Recent laboratory studies:  Lab Results  Component Value Date   HGB 9.0 (L) 12/27/2021   HGB 9.2 (L) 12/26/2021   HGB 6.8 (LL) 12/26/2021   Lab Results  Component Value Date   WBC 4.5 12/27/2021   PLT PLATELET CLUMPS NOTED ON SMEAR, UNABLE TO ESTIMATE 12/27/2021   Lab Results  Component Value Date   INR 1.0 12/23/2021   Lab Results  Component Value Date   NA 139 12/27/2021   K 3.8 12/27/2021   CL 104 12/27/2021   CO2 28 12/27/2021   BUN 7 12/27/2021   CREATININE 0.62 12/27/2021   GLUCOSE 101 (H) 12/27/2021    Discharge Medications:   Allergies as of 12/27/2021       Reactions   Morphine And Related Itching   Vimovo [naproxen-esomeprazole Mg] Swelling   Lip swelling        Medication List     STOP taking these medications    HYDROcodone-acetaminophen 10-325 MG tablet Commonly known as: NORCO       TAKE these medications    aspirin EC 325 MG tablet Take 1 tablet (325 mg total) by mouth in the morning and at bedtime. What changed:  medication  strength how much to take when to take this additional instructions   docusate sodium 100 MG capsule Commonly known as: Colace Take 1 capsule (100 mg total) by mouth daily as needed. What changed: reasons to take this   methocarbamol 500 MG tablet Commonly known as: ROBAXIN Take 1 tablet (500 mg total) by mouth every 6 (six) hours as needed for muscle spasms.   naloxone 4 MG/0.1ML Liqd nasal spray kit Commonly known as: NARCAN Place 1 spray into the nose as needed (opioid overdose).   ondansetron 4 MG tablet Commonly known as: ZOFRAN Take 1 tablet (4 mg total) by mouth every 6 (six) hours as needed for nausea.   oxyCODONE-acetaminophen 5-325 MG tablet Commonly known as: Percocet Take 1 tablet by mouth every 4 (four) hours as needed for severe pain.   pantoprazole 40 MG tablet Commonly known as: PROTONIX Take 40 mg by mouth at bedtime.   rOPINIRole 0.25 MG tablet Commonly known as: REQUIP Take 0.5 mg by mouth at bedtime.   SUMAtriptan 100 MG tablet Commonly known as: IMITREX Take 100 mg by mouth every 2 (two) hours as needed for migraine.   traZODone 150 MG tablet Commonly known as: DESYREL Take 150 mg by mouth at bedtime.   Vitamin D3 25 MCG tablet Commonly known as: Vitamin D Take 1 tablet (1,000 Units total) by mouth daily.               Durable Medical Equipment  (From admission, onward)           Start     Ordered   12/27/21 1031  For home use only DME 3 n 1  Once        12/27/21 1030            Diagnostic Studies: DG Tibia/Fibula Left  Result Date: 12/24/2021 CLINICAL DATA:  ORIF left tibial fracture EXAM: LEFT TIBIA AND FIBULA - 2 VIEW; DG C-ARM 1-60 MIN-NO REPORT COMPARISON:  12/24/2021 right tibia/fibula radiographs FLUOROSCOPY: Exposure Index (as provided by the fluoroscopic device): 3.45 mGy Kerma FINDINGS: Multiple spot fluoroscopic nondiagnostic intraoperative left tibia radiographs demonstrate transfixation of comminuted spiral  fracture in the left tibial shaft with intramedullary rod with proximal and distal interlocking screws. Oblique proximal shaft left fibula fracture also noted. Left total hip arthroplasty. IMPRESSION: 1. Intraoperative fluoroscopic guidance for ORIF left tibial shaft fracture. 2. Proximal shaft left fibula fracture. Electronically Signed   By: Ilona Sorrel M.D.   On: 12/24/2021  13:43   CT Tibia Fibula Left Wo Contrast  Result Date: 12/24/2021 CLINICAL DATA:  Fracture. EXAM: CT OF THE LOWER LEFT EXTREMITY WITHOUT CONTRAST TECHNIQUE: Multidetector CT imaging of the lower left extremity was performed according to the standard protocol. RADIATION DOSE REDUCTION: This exam was performed according to the departmental dose-optimization program which includes automated exposure control, adjustment of the mA and/or kV according to patient size and/or use of iterative reconstruction technique. COMPARISON:  X-ray same day. FINDINGS: Bones/Joint/Cartilage There is an acute comminuted oblique fracture through the mid and proximal tibial diaphysis. There is mild apex anterior angulation with 1 cm of anterior displacement of the distal fracture fragment. This extends to, but does not involve lateral aspect of the tibial plateau. There is an acute oblique comminuted fracture through the proximal fibular diaphysis. There is mild apex anterior angulation 1 cm of anterior displacement of the distal fracture fragment. Left knee total arthroplasty is present without evidence for hardware loosening. Alignment is anatomic. Joint spaces are well maintained. The bones are diffusely osteopenic. There is small low-density joint effusion identified in the knee along with knee calcifications in fact, likely sequelae from prior surgery. Ligaments Suboptimally assessed by CT. Muscles and Tendons Within normal limits. Soft tissues No focal hematoma identified. There is some subcutaneous edema near the lateral malleolus. IMPRESSION: 1.  Comminuted fracture involving the proximal and mid tibial diaphysis. 2. Comminuted fracture of the proximal fibular diaphysis. 3. Knee arthroplasty appears intact. 4. Small likely chronic knee joint effusion. Electronically Signed   By: Ronney Asters M.D.   On: 12/24/2021 01:19   DG Tibia/Fibula Left Port  Result Date: 12/24/2021 CLINICAL DATA:  Operative fixation of a comminuted left tibia fracture. EXAM: PORTABLE LEFT TIBIA AND FIBULA - 2 VIEW COMPARISON:  C-arm radiographs obtained earlier today and CT dated 12/24/2021. Radiographs dated 12/23/2021 FINDINGS: Interval intramedullary rod and screw fixation of the previously demonstrated comminuted tibial fracture. 1/3 shaft width of posterior displacement and mild posterior angulation of the distal fragment. 1/5 shaft width of medial displacement of the distal fragment. The previously demonstrated proximal fibula fracture is unchanged. A total knee prosthesis is again demonstrated. IMPRESSION: Hardware fixation of the previously demonstrated comminuted tibial fracture, as described above. Electronically Signed   By: Claudie Revering M.D.   On: 12/24/2021 14:30   DG Tibia/Fibula Left Port  Result Date: 12/24/2021 CLINICAL DATA:  Trauma, fall. EXAM: PORTABLE LEFT TIBIA AND FIBULA - 2 VIEW; LEFT FEMUR PORTABLE 1 VIEW COMPARISON:  None. FINDINGS: There is a minimally displaced spiral fracture of the mid tibial diaphysis, not well evaluated on portable view. There is a mildly displaced fracture of the proximal fibula diaphysis. Total hip and knee arthroplasty changes are noted on the with no evidence of hardware loosening. The soft tissues are within normal limits. IMPRESSION: 1. Mildly displaced fractures of the tibial and fibula diaphysis. 2. Status post total hip and total knee arthroplasty with no evidence of hardware loosening. Electronically Signed   By: Brett Fairy M.D.   On: 12/24/2021 00:17   DG C-Arm 1-60 Min-No Report  Result Date: 12/24/2021 CLINICAL  DATA:  ORIF left tibial fracture EXAM: LEFT TIBIA AND FIBULA - 2 VIEW; DG C-ARM 1-60 MIN-NO REPORT COMPARISON:  12/24/2021 right tibia/fibula radiographs FLUOROSCOPY: Exposure Index (as provided by the fluoroscopic device): 3.45 mGy Kerma FINDINGS: Multiple spot fluoroscopic nondiagnostic intraoperative left tibia radiographs demonstrate transfixation of comminuted spiral fracture in the left tibial shaft with intramedullary rod with proximal and distal interlocking screws.  Oblique proximal shaft left fibula fracture also noted. Left total hip arthroplasty. IMPRESSION: 1. Intraoperative fluoroscopic guidance for ORIF left tibial shaft fracture. 2. Proximal shaft left fibula fracture. Electronically Signed   By: Ilona Sorrel M.D.   On: 12/24/2021 13:43   DG C-Arm 1-60 Min-No Report  Result Date: 12/24/2021 CLINICAL DATA:  ORIF left tibial fracture EXAM: LEFT TIBIA AND FIBULA - 2 VIEW; DG C-ARM 1-60 MIN-NO REPORT COMPARISON:  12/24/2021 right tibia/fibula radiographs FLUOROSCOPY: Exposure Index (as provided by the fluoroscopic device): 3.45 mGy Kerma FINDINGS: Multiple spot fluoroscopic nondiagnostic intraoperative left tibia radiographs demonstrate transfixation of comminuted spiral fracture in the left tibial shaft with intramedullary rod with proximal and distal interlocking screws. Oblique proximal shaft left fibula fracture also noted. Left total hip arthroplasty. IMPRESSION: 1. Intraoperative fluoroscopic guidance for ORIF left tibial shaft fracture. 2. Proximal shaft left fibula fracture. Electronically Signed   By: Ilona Sorrel M.D.   On: 12/24/2021 13:43   DG Femur Portable 1 View Left  Result Date: 12/24/2021 CLINICAL DATA:  Trauma, fall. EXAM: PORTABLE LEFT TIBIA AND FIBULA - 2 VIEW; LEFT FEMUR PORTABLE 1 VIEW COMPARISON:  None. FINDINGS: There is a minimally displaced spiral fracture of the mid tibial diaphysis, not well evaluated on portable view. There is a mildly displaced fracture of the proximal  fibula diaphysis. Total hip and knee arthroplasty changes are noted on the with no evidence of hardware loosening. The soft tissues are within normal limits. IMPRESSION: 1. Mildly displaced fractures of the tibial and fibula diaphysis. 2. Status post total hip and total knee arthroplasty with no evidence of hardware loosening. Electronically Signed   By: Brett Fairy M.D.   On: 12/24/2021 00:17    Disposition: Discharge disposition: 01-Home or Self Care       Discharge Instructions     Ambulatory referral to Occupational Therapy   Complete by: As directed         Follow-up Information     Haddix, Thomasene Lot, MD. Schedule an appointment as soon as possible for a visit in 2 week(s).   Specialty: Orthopedic Surgery Why: for repeat x-rays and wound check Contact information: [place] [address] [phone]                  Signed: Ventura Bruns PA-C 12/27/2021, 11:14 AM

## 2022-01-11 DIAGNOSIS — Z7689 Persons encountering health services in other specified circumstances: Secondary | ICD-10-CM | POA: Diagnosis not present

## 2022-01-11 DIAGNOSIS — M1711 Unilateral primary osteoarthritis, right knee: Secondary | ICD-10-CM | POA: Diagnosis not present

## 2022-01-11 DIAGNOSIS — G43909 Migraine, unspecified, not intractable, without status migrainosus: Secondary | ICD-10-CM | POA: Diagnosis not present

## 2022-01-11 DIAGNOSIS — M8580 Other specified disorders of bone density and structure, unspecified site: Secondary | ICD-10-CM | POA: Diagnosis not present

## 2022-01-11 DIAGNOSIS — M961 Postlaminectomy syndrome, not elsewhere classified: Secondary | ICD-10-CM | POA: Diagnosis not present

## 2022-01-11 DIAGNOSIS — F17210 Nicotine dependence, cigarettes, uncomplicated: Secondary | ICD-10-CM | POA: Diagnosis not present

## 2022-01-11 DIAGNOSIS — Z79899 Other long term (current) drug therapy: Secondary | ICD-10-CM | POA: Diagnosis not present

## 2022-01-11 DIAGNOSIS — Z6824 Body mass index (BMI) 24.0-24.9, adult: Secondary | ICD-10-CM | POA: Diagnosis not present

## 2022-01-12 DIAGNOSIS — S82202E Unspecified fracture of shaft of left tibia, subsequent encounter for open fracture type I or II with routine healing: Secondary | ICD-10-CM | POA: Diagnosis not present

## 2022-01-15 DIAGNOSIS — Z79899 Other long term (current) drug therapy: Secondary | ICD-10-CM | POA: Diagnosis not present

## 2022-02-08 DIAGNOSIS — S82202E Unspecified fracture of shaft of left tibia, subsequent encounter for open fracture type I or II with routine healing: Secondary | ICD-10-CM | POA: Diagnosis not present

## 2022-02-09 DIAGNOSIS — G43909 Migraine, unspecified, not intractable, without status migrainosus: Secondary | ICD-10-CM | POA: Diagnosis not present

## 2022-02-09 DIAGNOSIS — Z79899 Other long term (current) drug therapy: Secondary | ICD-10-CM | POA: Diagnosis not present

## 2022-02-09 DIAGNOSIS — M1711 Unilateral primary osteoarthritis, right knee: Secondary | ICD-10-CM | POA: Diagnosis not present

## 2022-02-09 DIAGNOSIS — M961 Postlaminectomy syndrome, not elsewhere classified: Secondary | ICD-10-CM | POA: Diagnosis not present

## 2022-02-09 DIAGNOSIS — F17210 Nicotine dependence, cigarettes, uncomplicated: Secondary | ICD-10-CM | POA: Diagnosis not present

## 2022-02-09 DIAGNOSIS — M8580 Other specified disorders of bone density and structure, unspecified site: Secondary | ICD-10-CM | POA: Diagnosis not present

## 2022-02-09 DIAGNOSIS — Z6824 Body mass index (BMI) 24.0-24.9, adult: Secondary | ICD-10-CM | POA: Diagnosis not present

## 2022-03-09 DIAGNOSIS — S82202E Unspecified fracture of shaft of left tibia, subsequent encounter for open fracture type I or II with routine healing: Secondary | ICD-10-CM | POA: Diagnosis not present

## 2022-03-12 DIAGNOSIS — K860 Alcohol-induced chronic pancreatitis: Secondary | ICD-10-CM | POA: Diagnosis not present

## 2022-03-12 DIAGNOSIS — M961 Postlaminectomy syndrome, not elsewhere classified: Secondary | ICD-10-CM | POA: Diagnosis not present

## 2022-03-12 DIAGNOSIS — Z6824 Body mass index (BMI) 24.0-24.9, adult: Secondary | ICD-10-CM | POA: Diagnosis not present

## 2022-03-12 DIAGNOSIS — M1711 Unilateral primary osteoarthritis, right knee: Secondary | ICD-10-CM | POA: Diagnosis not present

## 2022-03-12 DIAGNOSIS — F17210 Nicotine dependence, cigarettes, uncomplicated: Secondary | ICD-10-CM | POA: Diagnosis not present

## 2022-03-12 DIAGNOSIS — G43909 Migraine, unspecified, not intractable, without status migrainosus: Secondary | ICD-10-CM | POA: Diagnosis not present

## 2022-03-12 DIAGNOSIS — Z79899 Other long term (current) drug therapy: Secondary | ICD-10-CM | POA: Diagnosis not present

## 2022-03-26 DIAGNOSIS — Z79899 Other long term (current) drug therapy: Secondary | ICD-10-CM | POA: Diagnosis not present

## 2022-03-26 DIAGNOSIS — F17210 Nicotine dependence, cigarettes, uncomplicated: Secondary | ICD-10-CM | POA: Diagnosis not present

## 2022-03-26 DIAGNOSIS — G43909 Migraine, unspecified, not intractable, without status migrainosus: Secondary | ICD-10-CM | POA: Diagnosis not present

## 2022-03-26 DIAGNOSIS — Z96652 Presence of left artificial knee joint: Secondary | ICD-10-CM | POA: Diagnosis not present

## 2022-03-26 DIAGNOSIS — Z6824 Body mass index (BMI) 24.0-24.9, adult: Secondary | ICD-10-CM | POA: Diagnosis not present

## 2022-03-26 DIAGNOSIS — M961 Postlaminectomy syndrome, not elsewhere classified: Secondary | ICD-10-CM | POA: Diagnosis not present

## 2022-03-26 DIAGNOSIS — M1711 Unilateral primary osteoarthritis, right knee: Secondary | ICD-10-CM | POA: Diagnosis not present

## 2022-04-20 DIAGNOSIS — S82202E Unspecified fracture of shaft of left tibia, subsequent encounter for open fracture type I or II with routine healing: Secondary | ICD-10-CM | POA: Diagnosis not present

## 2022-04-23 DIAGNOSIS — Z6824 Body mass index (BMI) 24.0-24.9, adult: Secondary | ICD-10-CM | POA: Diagnosis not present

## 2022-04-23 DIAGNOSIS — G43909 Migraine, unspecified, not intractable, without status migrainosus: Secondary | ICD-10-CM | POA: Diagnosis not present

## 2022-04-23 DIAGNOSIS — M961 Postlaminectomy syndrome, not elsewhere classified: Secondary | ICD-10-CM | POA: Diagnosis not present

## 2022-04-23 DIAGNOSIS — M1711 Unilateral primary osteoarthritis, right knee: Secondary | ICD-10-CM | POA: Diagnosis not present

## 2022-04-23 DIAGNOSIS — Z79899 Other long term (current) drug therapy: Secondary | ICD-10-CM | POA: Diagnosis not present

## 2022-04-23 DIAGNOSIS — F17210 Nicotine dependence, cigarettes, uncomplicated: Secondary | ICD-10-CM | POA: Diagnosis not present

## 2022-04-23 DIAGNOSIS — M8580 Other specified disorders of bone density and structure, unspecified site: Secondary | ICD-10-CM | POA: Diagnosis not present

## 2022-05-19 DIAGNOSIS — H5213 Myopia, bilateral: Secondary | ICD-10-CM | POA: Diagnosis not present

## 2022-05-19 DIAGNOSIS — H16223 Keratoconjunctivitis sicca, not specified as Sjogren's, bilateral: Secondary | ICD-10-CM | POA: Diagnosis not present

## 2022-05-19 DIAGNOSIS — H524 Presbyopia: Secondary | ICD-10-CM | POA: Diagnosis not present

## 2022-05-21 DIAGNOSIS — M8580 Other specified disorders of bone density and structure, unspecified site: Secondary | ICD-10-CM | POA: Diagnosis not present

## 2022-05-21 DIAGNOSIS — M1711 Unilateral primary osteoarthritis, right knee: Secondary | ICD-10-CM | POA: Diagnosis not present

## 2022-05-21 DIAGNOSIS — F17210 Nicotine dependence, cigarettes, uncomplicated: Secondary | ICD-10-CM | POA: Diagnosis not present

## 2022-05-21 DIAGNOSIS — Z6824 Body mass index (BMI) 24.0-24.9, adult: Secondary | ICD-10-CM | POA: Diagnosis not present

## 2022-05-21 DIAGNOSIS — Z79899 Other long term (current) drug therapy: Secondary | ICD-10-CM | POA: Diagnosis not present

## 2022-05-21 DIAGNOSIS — S82222S Displaced transverse fracture of shaft of left tibia, sequela: Secondary | ICD-10-CM | POA: Diagnosis not present

## 2022-05-21 DIAGNOSIS — F1090 Alcohol use, unspecified, uncomplicated: Secondary | ICD-10-CM | POA: Diagnosis not present

## 2022-06-11 IMAGING — RF DG TIBIA/FIBULA 2V*L*
1 series · 9 of 9 positions shown · non-contrast
Comparison: 12/24/2021 right tibia/fibula radiographs

CLINICAL DATA: ORIF left tibial fracture

EXAM:
LEFT TIBIA AND FIBULA - 2 VIEW; DG C-ARM 1-60 MIN-NO REPORT

[Series 1: run · 9 of 9 slices shown]
[im 1/9]
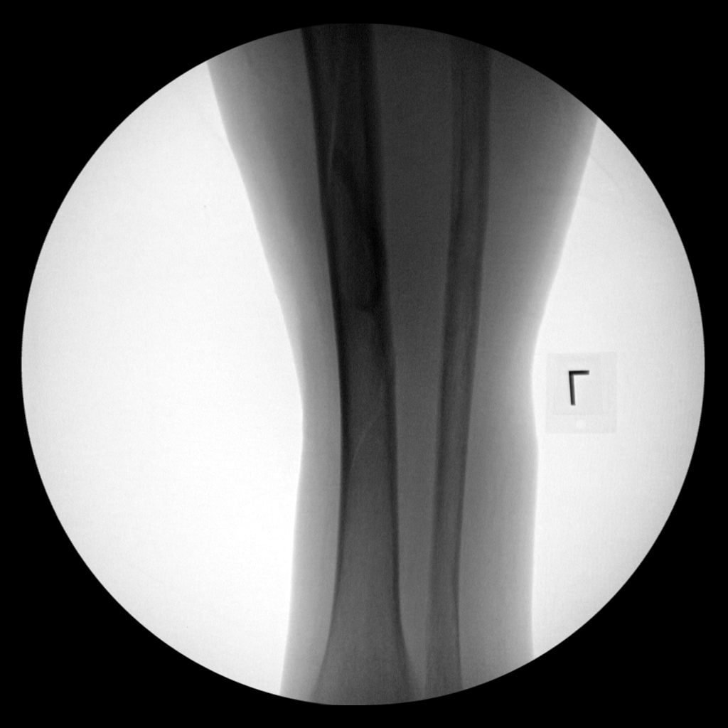
[im 2/9]
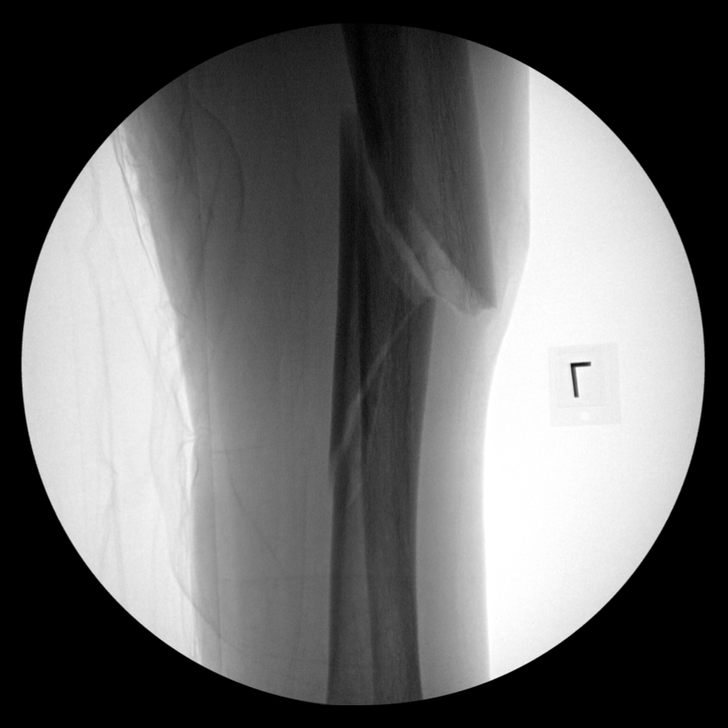
[im 3/9]
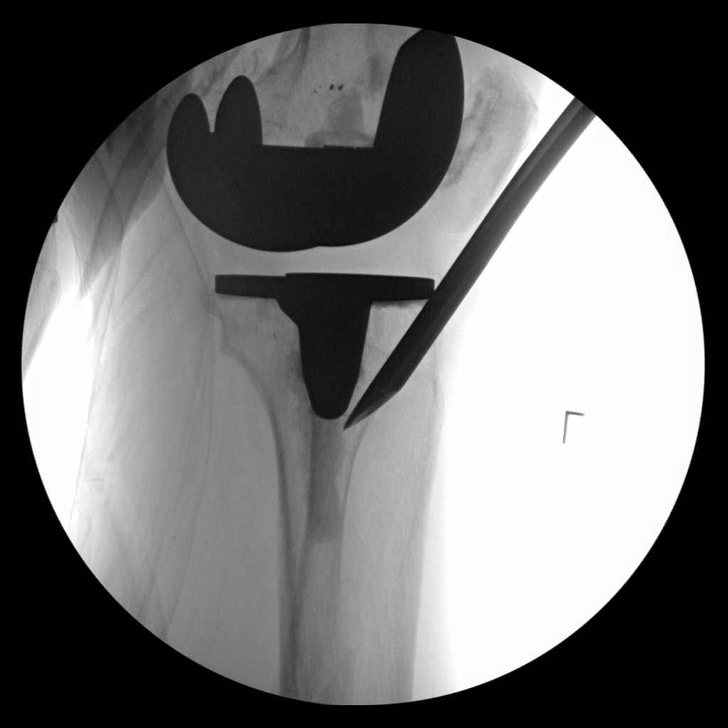
[im 4/9]
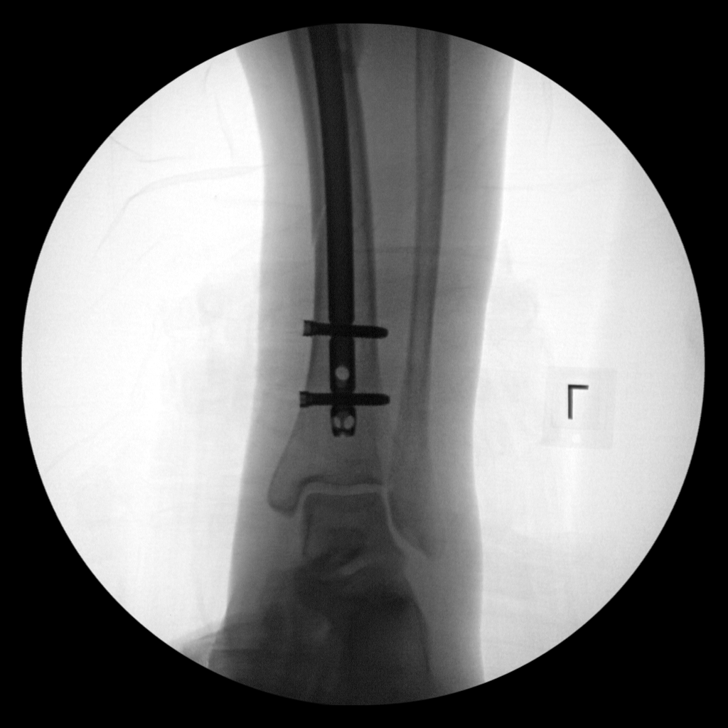
[im 5/9]
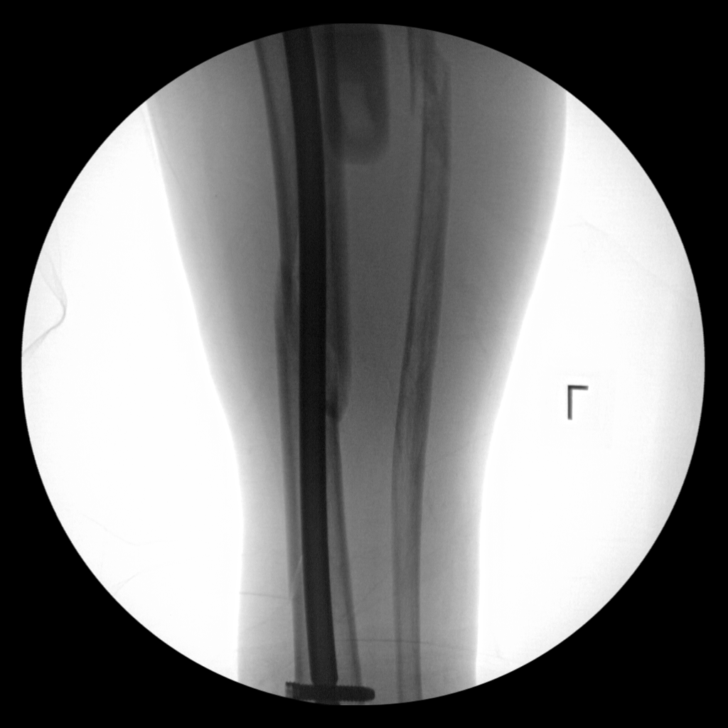
[im 6/9]
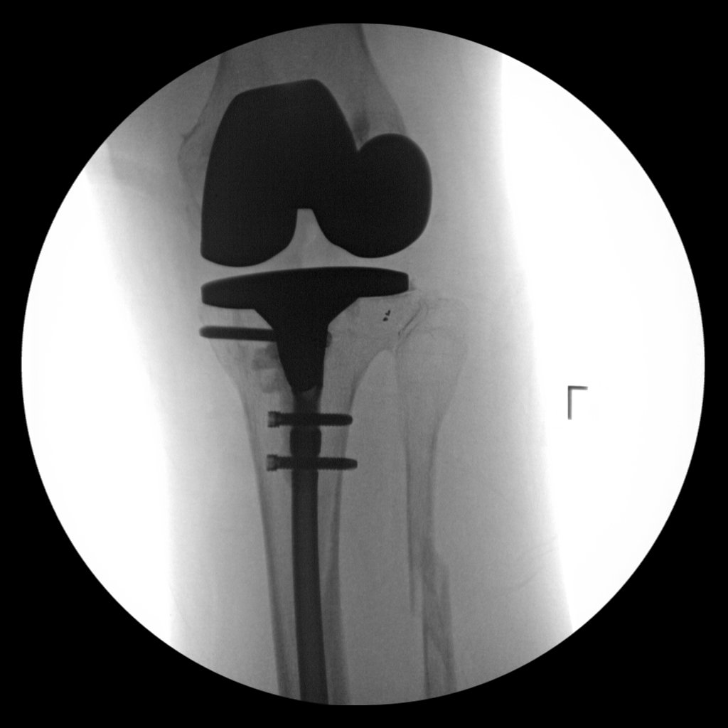
[im 7/9]
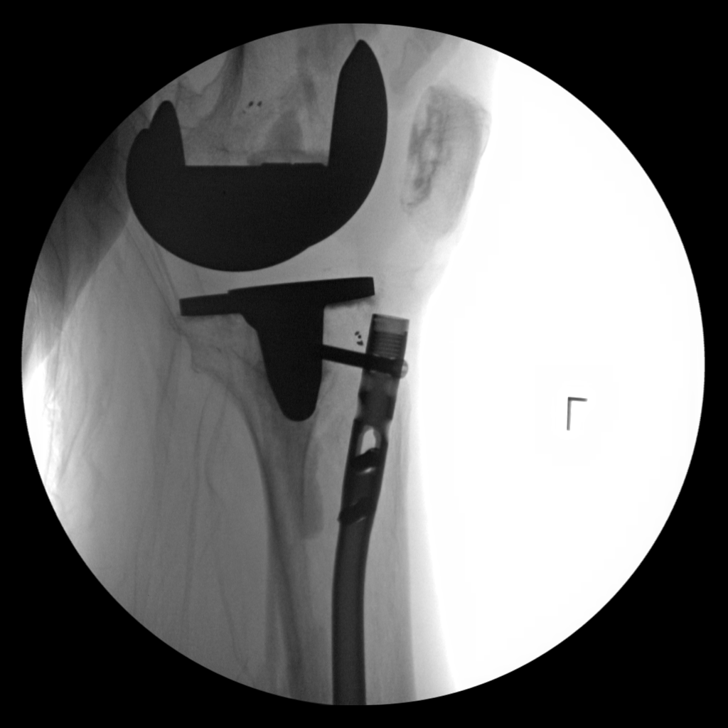
[im 8/9]
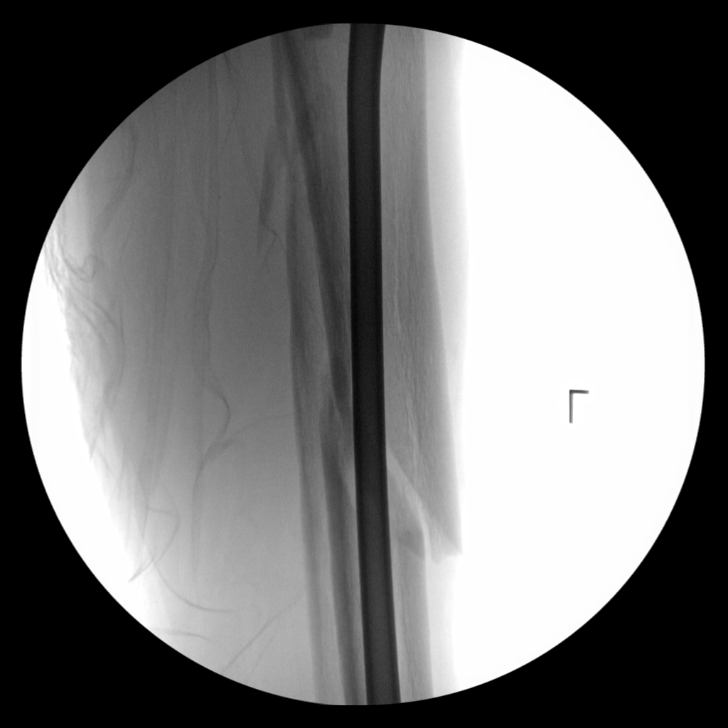
[im 9/9]
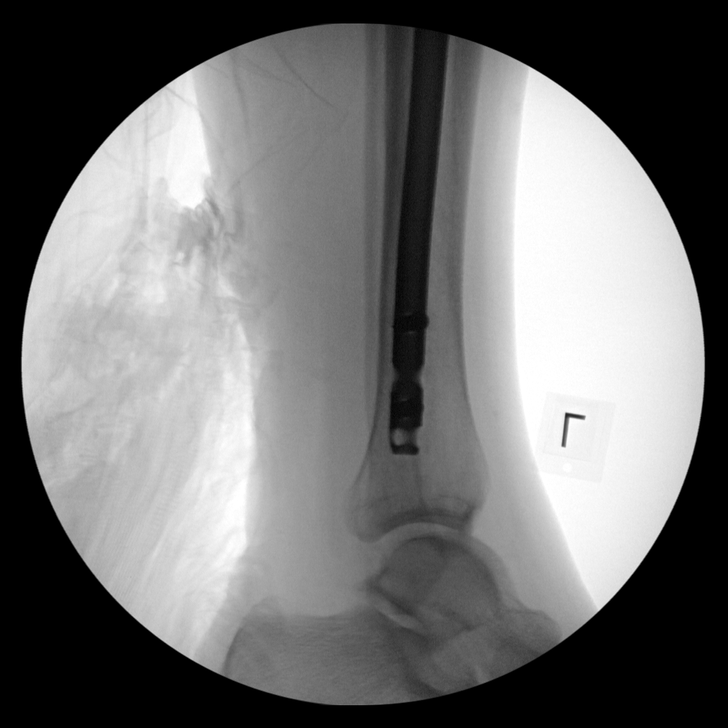

[9 of 9 positions shown; findings below may reference images not displayed]

FLUOROSCOPY:
Exposure Index (as provided by the fluoroscopic device): 3.45 mGy
Kerma
FINDINGS: Multiple spot fluoroscopic nondiagnostic intraoperative left tibia
radiographs demonstrate transfixation of comminuted spiral fracture
in the left tibial shaft with intramedullary rod with proximal and
distal interlocking screws. Oblique proximal shaft left fibula
fracture also noted. Left total hip arthroplasty.
IMPRESSION: 1. Intraoperative fluoroscopic guidance for ORIF left tibial shaft
fracture.
2. Proximal shaft left fibula fracture.

## 2022-06-11 IMAGING — CT CT TIBIA FIBULA *L* W/O CM
3 series · 9 of 33 positions shown, 10 images · non-contrast
Comparison: X-ray same day.

CLINICAL DATA: Fracture.

EXAM:
CT OF THE LOWER LEFT EXTREMITY WITHOUT CONTRAST
TECHNIQUE: Multidetector CT imaging of the lower left extremity was performed
according to the standard protocol.
RADIATION DOSE REDUCTION: This exam was performed according to the
departmental dose-optimization program which includes automated
exposure control, adjustment of the mA and/or kV according to
patient size and/or use of iterative reconstruction technique.

[Series 4: extremity soft tissue (person_name) · axial · 0.48mm/px · z∈[-951,-951]mm · 1 of 229 slices shown, 2 images]
[im 123/229  soft-tissue]
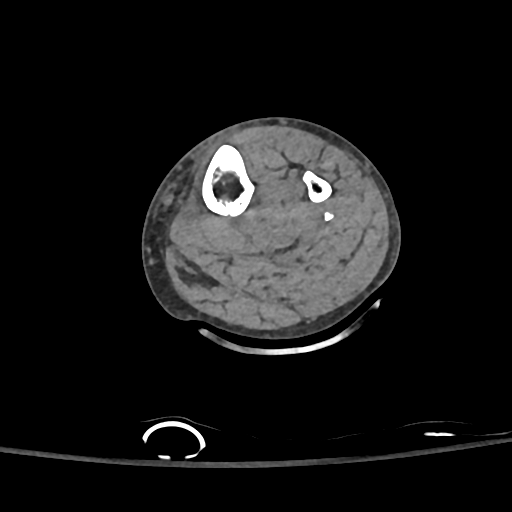
[im 123/229  bone]
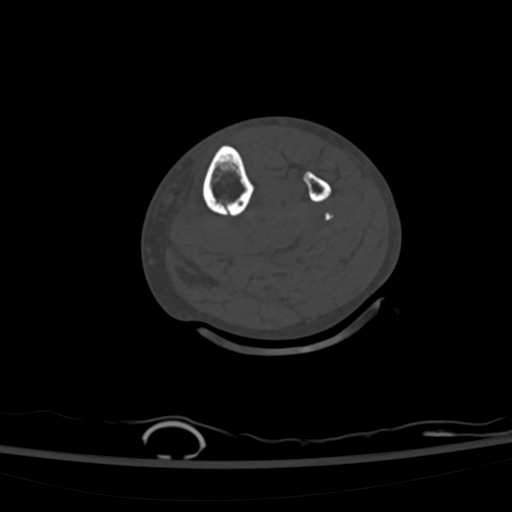

[Series 8: cor soft tissue (person_name) · coronal · 0.45mm/px · 3 of 69 slices shown]
[im 14/69  bone]
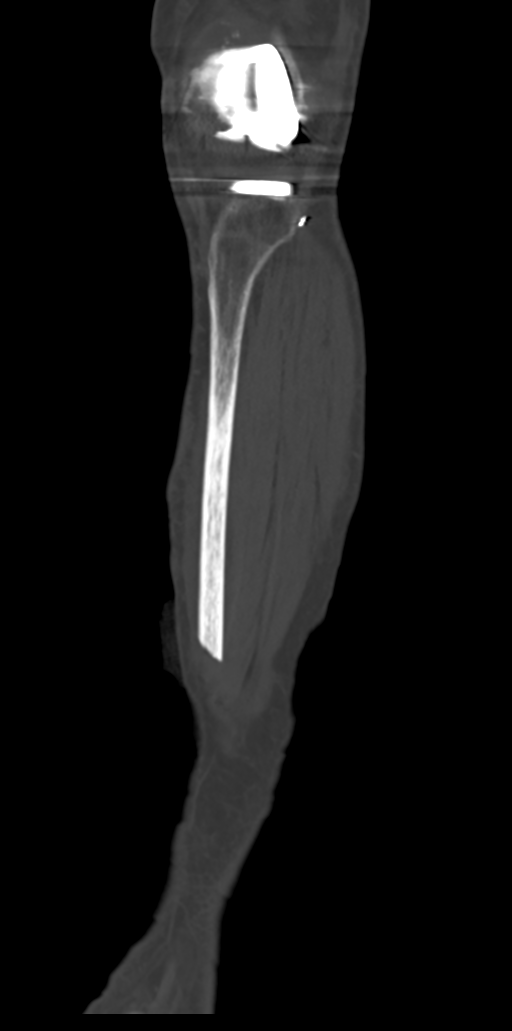
[im 28/69  bone]
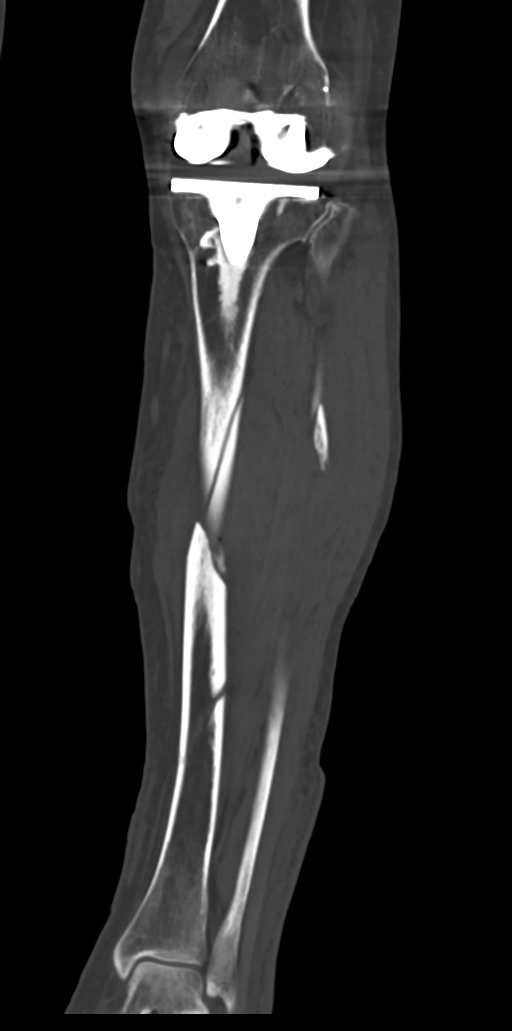
[im 41/69  bone]
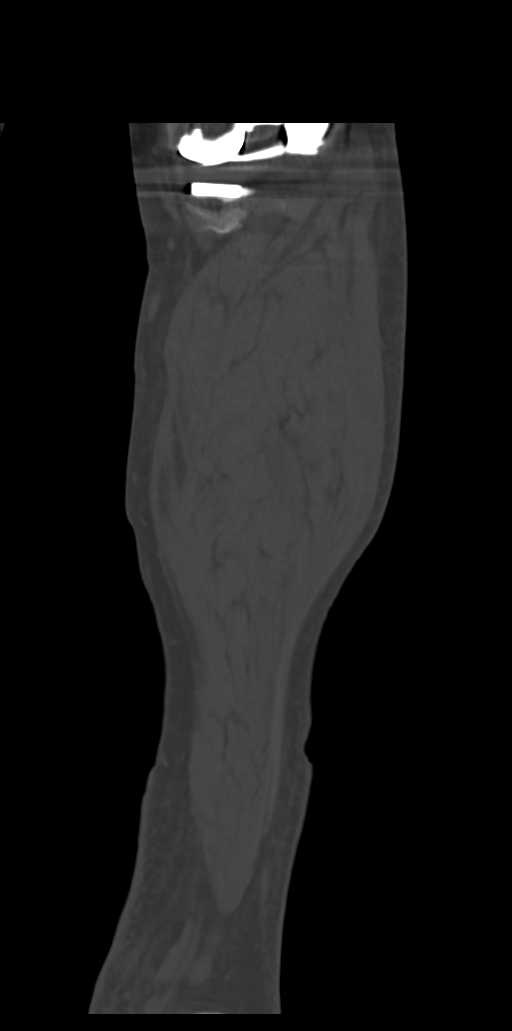

[Series 9: sag soft tissue (person_name) · sagittal · 0.39mm/px · 5 of 72 slices shown]
[im 24/72  bone]
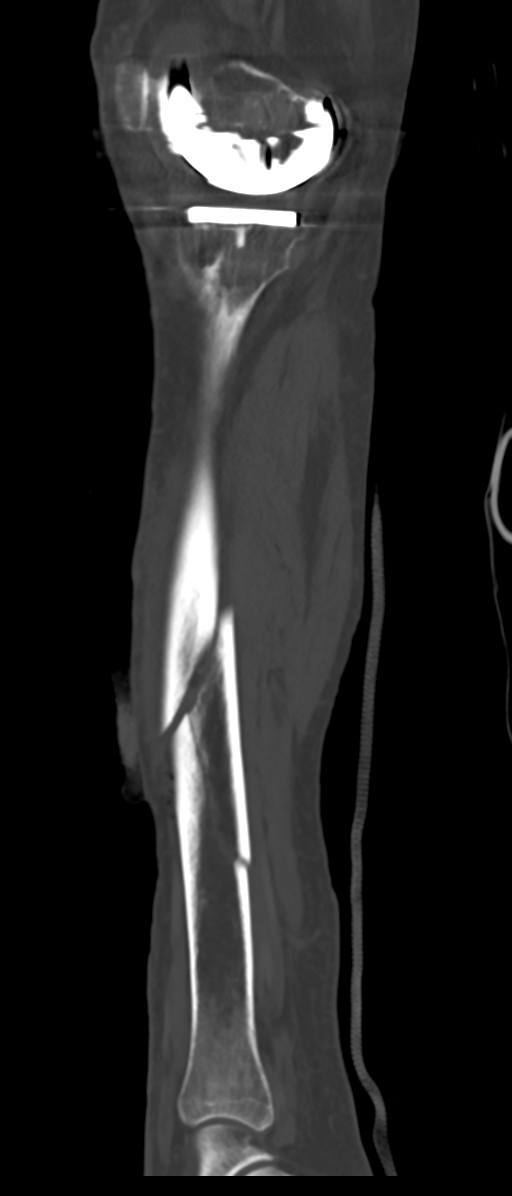
[im 30/72  bone]
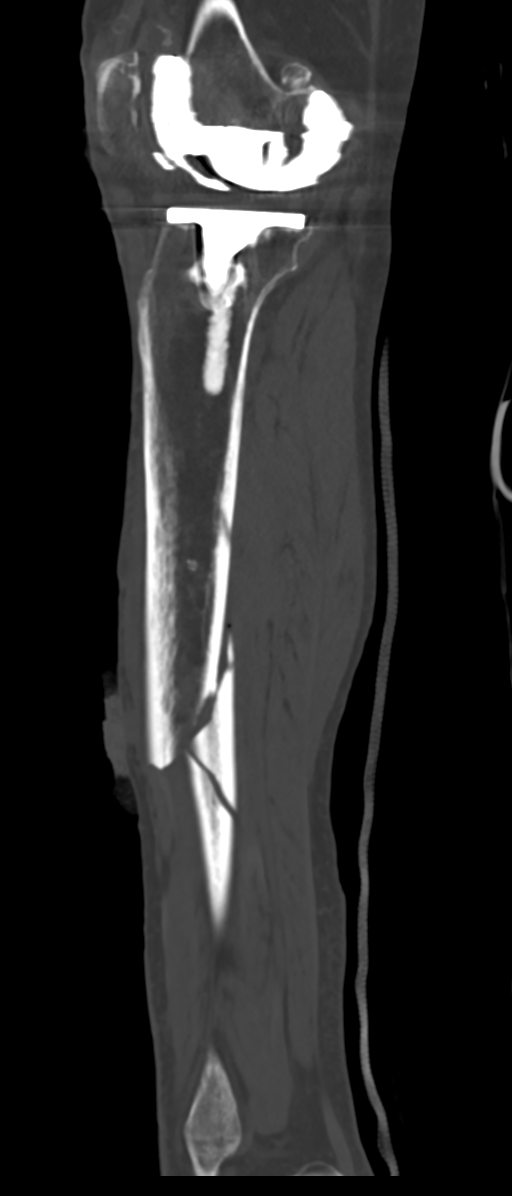
[im 36/72  bone]
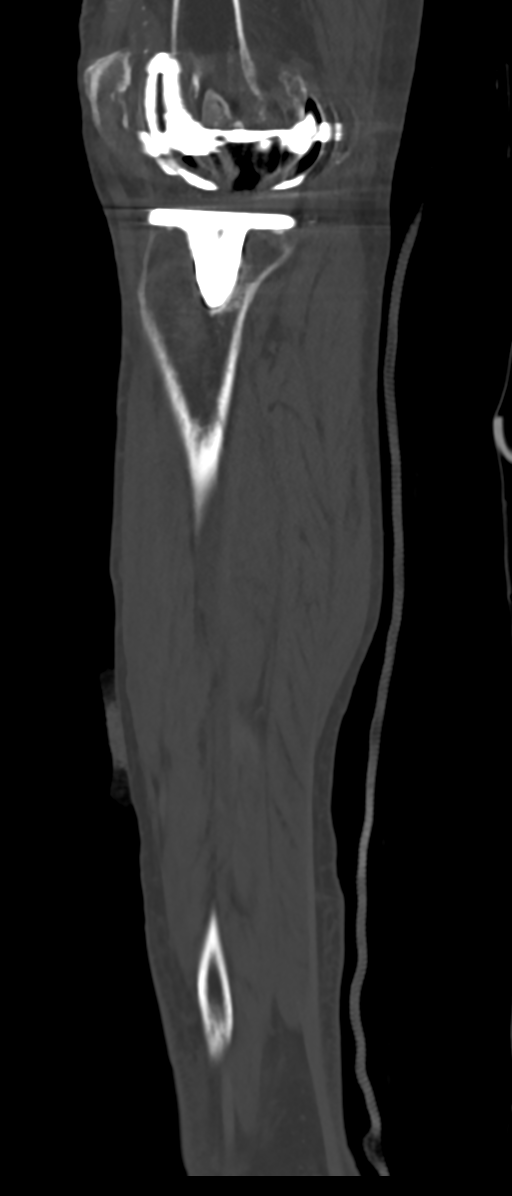
[im 42/72  bone]
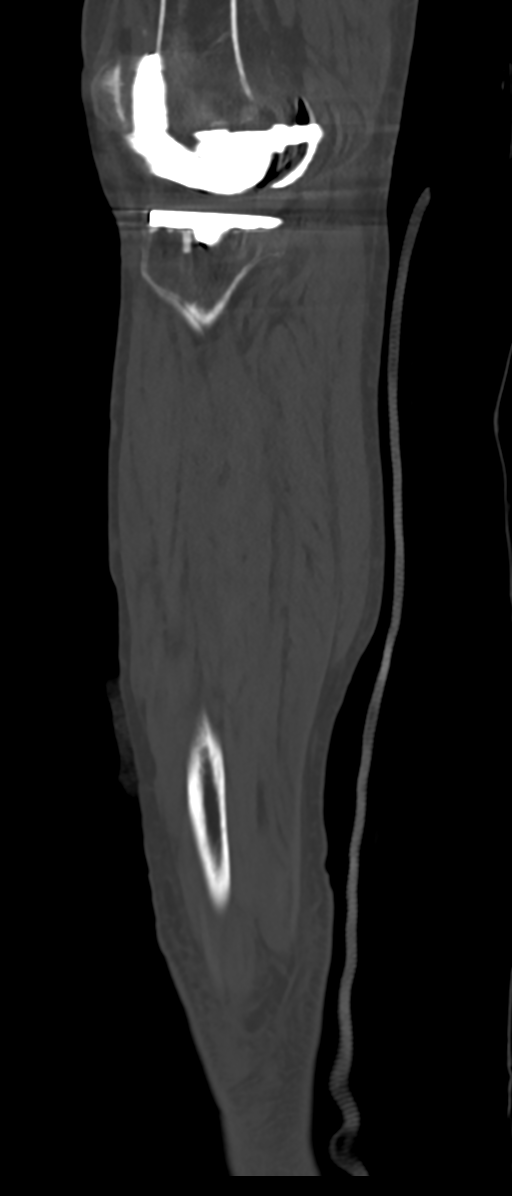
[im 48/72  bone]
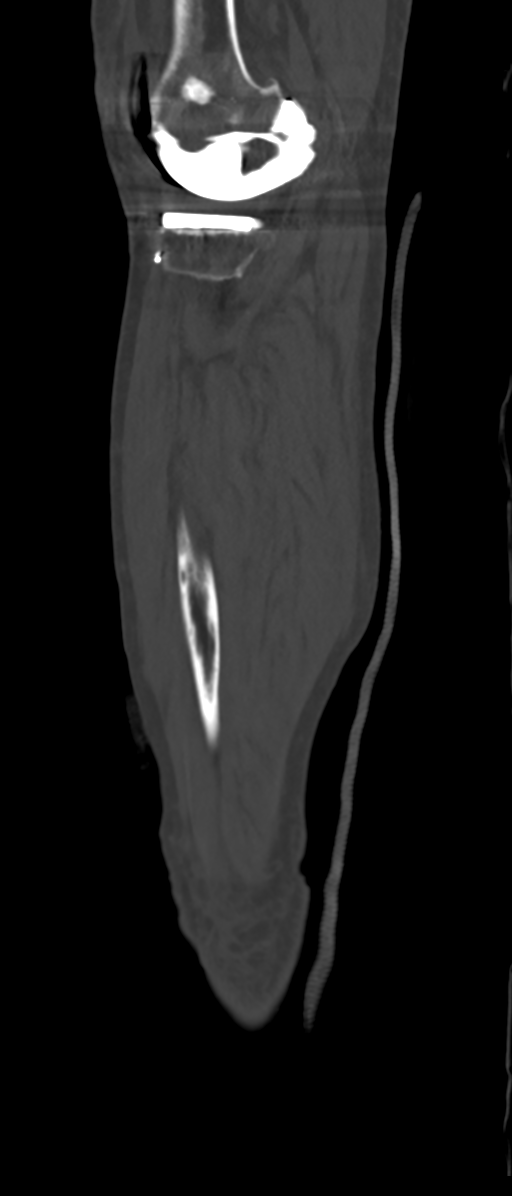

[9 of 33 positions shown; findings below may reference images not displayed]

FINDINGS: Bones/Joint/Cartilage

There is an acute comminuted oblique fracture through the mid and
proximal tibial diaphysis. There is mild apex anterior angulation
with 1 cm of anterior displacement of the distal fracture fragment.
This extends to, but does not involve lateral aspect of the tibial
plateau.

There is an acute oblique comminuted fracture through the proximal
fibular diaphysis. There is mild apex anterior angulation 1 cm of
anterior displacement of the distal fracture fragment.

Left knee total arthroplasty is present without evidence for
hardware loosening. Alignment is anatomic. Joint spaces are well
maintained. The bones are diffusely osteopenic.

There is small low-density joint effusion identified in the knee
along with knee calcifications in fact, likely sequelae from prior
surgery.

Ligaments

Suboptimally assessed by CT.

Muscles and Tendons

Within normal limits.

Soft tissues

No focal hematoma identified. There is some subcutaneous edema near
the lateral malleolus.
IMPRESSION: 1. Comminuted fracture involving the proximal and mid tibial
diaphysis.
2. Comminuted fracture of the proximal fibular diaphysis.
3. Knee arthroplasty appears intact.
4. Small likely chronic knee joint effusion.

## 2022-07-01 ENCOUNTER — Other Ambulatory Visit: Payer: Self-pay | Admitting: Student

## 2022-07-01 DIAGNOSIS — S82202B Unspecified fracture of shaft of left tibia, initial encounter for open fracture type I or II: Secondary | ICD-10-CM

## 2022-07-13 ENCOUNTER — Other Ambulatory Visit: Payer: Medicare HMO

## 2022-10-26 ENCOUNTER — Other Ambulatory Visit: Payer: Self-pay | Admitting: Family Medicine

## 2022-10-26 DIAGNOSIS — F17200 Nicotine dependence, unspecified, uncomplicated: Secondary | ICD-10-CM

## 2022-10-27 ENCOUNTER — Other Ambulatory Visit (HOSPITAL_BASED_OUTPATIENT_CLINIC_OR_DEPARTMENT_OTHER): Payer: Self-pay | Admitting: Family Medicine

## 2022-10-27 ENCOUNTER — Telehealth (HOSPITAL_BASED_OUTPATIENT_CLINIC_OR_DEPARTMENT_OTHER): Payer: Self-pay

## 2022-10-27 DIAGNOSIS — Z1231 Encounter for screening mammogram for malignant neoplasm of breast: Secondary | ICD-10-CM

## 2022-11-16 ENCOUNTER — Telehealth (HOSPITAL_BASED_OUTPATIENT_CLINIC_OR_DEPARTMENT_OTHER): Payer: Self-pay

## 2023-05-10 ENCOUNTER — Other Ambulatory Visit (HOSPITAL_BASED_OUTPATIENT_CLINIC_OR_DEPARTMENT_OTHER): Payer: Self-pay | Admitting: Physician Assistant

## 2023-05-10 DIAGNOSIS — Z1231 Encounter for screening mammogram for malignant neoplasm of breast: Secondary | ICD-10-CM

## 2023-05-12 ENCOUNTER — Encounter (HOSPITAL_BASED_OUTPATIENT_CLINIC_OR_DEPARTMENT_OTHER): Payer: Self-pay

## 2023-05-12 ENCOUNTER — Ambulatory Visit (HOSPITAL_BASED_OUTPATIENT_CLINIC_OR_DEPARTMENT_OTHER)
Admission: RE | Admit: 2023-05-12 | Discharge: 2023-05-12 | Disposition: A | Discharge status: Home / Self Care | Payer: Medicare HMO | Source: Ambulatory Visit | Attending: Physician Assistant | Admitting: Physician Assistant

## 2023-05-12 DIAGNOSIS — Z1231 Encounter for screening mammogram for malignant neoplasm of breast: Secondary | ICD-10-CM | POA: Insufficient documentation

## 2024-04-17 ENCOUNTER — Other Ambulatory Visit (HOSPITAL_BASED_OUTPATIENT_CLINIC_OR_DEPARTMENT_OTHER): Payer: Self-pay | Admitting: Physician Assistant

## 2024-04-17 DIAGNOSIS — Z1231 Encounter for screening mammogram for malignant neoplasm of breast: Secondary | ICD-10-CM

## 2024-05-14 ENCOUNTER — Encounter (HOSPITAL_BASED_OUTPATIENT_CLINIC_OR_DEPARTMENT_OTHER): Payer: Self-pay

## 2024-05-14 ENCOUNTER — Ambulatory Visit (HOSPITAL_BASED_OUTPATIENT_CLINIC_OR_DEPARTMENT_OTHER)
Admission: RE | Admit: 2024-05-14 | Discharge: 2024-05-14 | Disposition: A | Discharge status: Home / Self Care | Source: Ambulatory Visit | Attending: Physician Assistant | Admitting: Physician Assistant

## 2024-05-14 DIAGNOSIS — Z1231 Encounter for screening mammogram for malignant neoplasm of breast: Secondary | ICD-10-CM | POA: Insufficient documentation
